# Patient Record
Sex: Male | Born: 1950 | Race: White | Hispanic: No | Marital: Married | Smoking: Current every day smoker
Health system: Southern US, Community
[De-identification: ages and names within clinical notes are randomized; demographics above are authoritative.]

## PROBLEM LIST (undated history)

## (undated) DIAGNOSIS — H269 Unspecified cataract: Secondary | ICD-10-CM

## (undated) DIAGNOSIS — F32A Depression, unspecified: Secondary | ICD-10-CM

## (undated) DIAGNOSIS — G709 Myoneural disorder, unspecified: Secondary | ICD-10-CM

## (undated) DIAGNOSIS — I2510 Atherosclerotic heart disease of native coronary artery without angina pectoris: Secondary | ICD-10-CM

## (undated) DIAGNOSIS — I700 Atherosclerosis of aorta: Secondary | ICD-10-CM

## (undated) DIAGNOSIS — F419 Anxiety disorder, unspecified: Secondary | ICD-10-CM

## (undated) DIAGNOSIS — F329 Major depressive disorder, single episode, unspecified: Secondary | ICD-10-CM

## (undated) DIAGNOSIS — J449 Chronic obstructive pulmonary disease, unspecified: Secondary | ICD-10-CM

## (undated) DIAGNOSIS — M1990 Unspecified osteoarthritis, unspecified site: Secondary | ICD-10-CM

## (undated) HISTORY — DX: Myoneural disorder, unspecified: G70.9

## (undated) HISTORY — PX: BACK SURGERY: SHX140

## (undated) HISTORY — DX: Unspecified osteoarthritis, unspecified site: M19.90

## (undated) HISTORY — DX: Atherosclerotic heart disease of native coronary artery without angina pectoris: I25.10

## (undated) HISTORY — DX: Major depressive disorder, single episode, unspecified: F32.9

## (undated) HISTORY — DX: Atherosclerosis of aorta: I70.0

## (undated) HISTORY — DX: Depression, unspecified: F32.A

## (undated) HISTORY — DX: Unspecified cataract: H26.9

---

## 2000-10-25 ENCOUNTER — Inpatient Hospital Stay (HOSPITAL_COMMUNITY): Admission: EM | Admit: 2000-10-25 | Discharge: 2000-10-26 | Payer: Self-pay | Admitting: *Deleted

## 2003-04-27 ENCOUNTER — Encounter: Admission: RE | Admit: 2003-04-27 | Discharge: 2003-04-27 | Payer: Self-pay | Admitting: *Deleted

## 2003-04-29 ENCOUNTER — Encounter: Admission: RE | Admit: 2003-04-29 | Discharge: 2003-06-06 | Payer: Self-pay | Admitting: *Deleted

## 2003-06-02 ENCOUNTER — Encounter: Admission: RE | Admit: 2003-06-02 | Discharge: 2003-06-02 | Payer: Self-pay | Admitting: Neurosurgery

## 2003-06-07 HISTORY — PX: LUMBAR DISC SURGERY: SHX700

## 2003-06-17 ENCOUNTER — Encounter: Admission: RE | Admit: 2003-06-17 | Discharge: 2003-06-17 | Payer: Self-pay | Admitting: Neurosurgery

## 2003-06-29 ENCOUNTER — Inpatient Hospital Stay (HOSPITAL_COMMUNITY): Admission: RE | Admit: 2003-06-29 | Discharge: 2003-07-01 | Payer: Self-pay | Admitting: Neurosurgery

## 2003-07-07 ENCOUNTER — Encounter: Admission: RE | Admit: 2003-07-07 | Discharge: 2003-07-07 | Payer: Self-pay | Admitting: Neurosurgery

## 2003-07-08 HISTORY — PX: REPAIR DURAL / CSF LEAK: SUR1169

## 2003-07-14 ENCOUNTER — Inpatient Hospital Stay (HOSPITAL_COMMUNITY): Admission: AD | Admit: 2003-07-14 | Discharge: 2003-07-18 | Payer: Self-pay | Admitting: Neurosurgery

## 2003-09-13 ENCOUNTER — Encounter: Admission: RE | Admit: 2003-09-13 | Discharge: 2003-09-13 | Payer: Self-pay | Admitting: Neurosurgery

## 2003-09-19 ENCOUNTER — Encounter: Admission: RE | Admit: 2003-09-19 | Discharge: 2003-09-19 | Payer: Self-pay | Admitting: Infectious Diseases

## 2003-09-20 ENCOUNTER — Inpatient Hospital Stay (HOSPITAL_COMMUNITY): Admission: AD | Admit: 2003-09-20 | Discharge: 2003-09-23 | Payer: Self-pay | Admitting: Neurosurgery

## 2003-10-17 ENCOUNTER — Encounter: Admission: RE | Admit: 2003-10-17 | Discharge: 2003-10-17 | Payer: Self-pay | Admitting: Infectious Diseases

## 2004-10-01 ENCOUNTER — Encounter: Admission: RE | Admit: 2004-10-01 | Discharge: 2004-10-01 | Payer: Self-pay | Admitting: Family Medicine

## 2004-10-18 ENCOUNTER — Ambulatory Visit: Payer: Self-pay | Admitting: Gastroenterology

## 2004-10-22 ENCOUNTER — Ambulatory Visit: Payer: Self-pay | Admitting: Gastroenterology

## 2004-10-23 ENCOUNTER — Ambulatory Visit: Payer: Self-pay | Admitting: Gastroenterology

## 2004-10-25 ENCOUNTER — Ambulatory Visit: Payer: Self-pay | Admitting: Cardiology

## 2004-11-13 ENCOUNTER — Ambulatory Visit: Payer: Self-pay | Admitting: Emergency Medicine

## 2005-01-14 ENCOUNTER — Ambulatory Visit: Payer: Self-pay | Admitting: Emergency Medicine

## 2005-03-27 ENCOUNTER — Ambulatory Visit: Payer: Self-pay | Admitting: Internal Medicine

## 2005-04-09 ENCOUNTER — Ambulatory Visit: Payer: Self-pay | Admitting: Emergency Medicine

## 2005-04-23 ENCOUNTER — Ambulatory Visit: Payer: Self-pay

## 2005-05-01 ENCOUNTER — Ambulatory Visit: Payer: Self-pay | Admitting: Cardiology

## 2005-05-13 ENCOUNTER — Ambulatory Visit: Payer: Self-pay

## 2005-05-13 ENCOUNTER — Encounter: Payer: Self-pay | Admitting: Internal Medicine

## 2005-05-30 ENCOUNTER — Ambulatory Visit: Payer: Self-pay | Admitting: Cardiology

## 2005-06-10 ENCOUNTER — Ambulatory Visit: Payer: Self-pay | Admitting: Cardiology

## 2005-06-13 ENCOUNTER — Ambulatory Visit: Payer: Self-pay | Admitting: Internal Medicine

## 2005-06-13 ENCOUNTER — Inpatient Hospital Stay (HOSPITAL_BASED_OUTPATIENT_CLINIC_OR_DEPARTMENT_OTHER): Admission: RE | Admit: 2005-06-13 | Discharge: 2005-06-13 | Payer: Self-pay | Admitting: Cardiology

## 2006-02-18 ENCOUNTER — Ambulatory Visit: Payer: Self-pay | Admitting: Emergency Medicine

## 2006-11-06 ENCOUNTER — Emergency Department (HOSPITAL_COMMUNITY): Admission: EM | Admit: 2006-11-06 | Discharge: 2006-11-06 | Payer: Self-pay | Admitting: Emergency Medicine

## 2006-11-13 ENCOUNTER — Encounter: Payer: Self-pay | Admitting: Gastroenterology

## 2006-11-13 ENCOUNTER — Ambulatory Visit (HOSPITAL_COMMUNITY): Admission: RE | Admit: 2006-11-13 | Discharge: 2006-11-13 | Payer: Self-pay | Admitting: Gastroenterology

## 2006-11-14 ENCOUNTER — Ambulatory Visit: Payer: Self-pay | Admitting: Gastroenterology

## 2006-11-17 ENCOUNTER — Ambulatory Visit (HOSPITAL_COMMUNITY): Admission: RE | Admit: 2006-11-17 | Discharge: 2006-11-17 | Payer: Self-pay | Admitting: Gastroenterology

## 2006-11-20 ENCOUNTER — Ambulatory Visit: Payer: Self-pay | Admitting: Gastroenterology

## 2006-12-30 ENCOUNTER — Ambulatory Visit: Payer: Self-pay | Admitting: Physical Medicine & Rehabilitation

## 2006-12-30 ENCOUNTER — Encounter
Admission: RE | Admit: 2006-12-30 | Discharge: 2007-03-30 | Payer: Self-pay | Admitting: Physical Medicine & Rehabilitation

## 2007-02-11 ENCOUNTER — Ambulatory Visit: Payer: Self-pay | Admitting: Physical Medicine & Rehabilitation

## 2007-03-30 ENCOUNTER — Encounter: Payer: Self-pay | Admitting: Emergency Medicine

## 2007-03-30 DIAGNOSIS — J449 Chronic obstructive pulmonary disease, unspecified: Secondary | ICD-10-CM | POA: Insufficient documentation

## 2007-03-30 DIAGNOSIS — J45909 Unspecified asthma, uncomplicated: Secondary | ICD-10-CM | POA: Insufficient documentation

## 2007-03-30 DIAGNOSIS — F17200 Nicotine dependence, unspecified, uncomplicated: Secondary | ICD-10-CM | POA: Insufficient documentation

## 2007-03-30 DIAGNOSIS — M129 Arthropathy, unspecified: Secondary | ICD-10-CM | POA: Insufficient documentation

## 2007-03-30 DIAGNOSIS — M5137 Other intervertebral disc degeneration, lumbosacral region: Secondary | ICD-10-CM | POA: Insufficient documentation

## 2007-03-30 DIAGNOSIS — J438 Other emphysema: Secondary | ICD-10-CM | POA: Insufficient documentation

## 2007-03-30 DIAGNOSIS — M1990 Unspecified osteoarthritis, unspecified site: Secondary | ICD-10-CM | POA: Insufficient documentation

## 2008-02-04 ENCOUNTER — Emergency Department (HOSPITAL_COMMUNITY): Admission: EM | Admit: 2008-02-04 | Discharge: 2008-02-04 | Payer: Self-pay | Admitting: Emergency Medicine

## 2008-05-19 ENCOUNTER — Telehealth (INDEPENDENT_AMBULATORY_CARE_PROVIDER_SITE_OTHER): Payer: Self-pay

## 2008-05-23 ENCOUNTER — Encounter: Payer: Self-pay | Admitting: Nurse Practitioner

## 2008-05-23 ENCOUNTER — Telehealth: Payer: Self-pay | Admitting: Nurse Practitioner

## 2008-05-23 ENCOUNTER — Ambulatory Visit: Payer: Self-pay | Admitting: Internal Medicine

## 2008-05-23 DIAGNOSIS — R935 Abnormal findings on diagnostic imaging of other abdominal regions, including retroperitoneum: Secondary | ICD-10-CM | POA: Insufficient documentation

## 2008-05-23 DIAGNOSIS — R634 Abnormal weight loss: Secondary | ICD-10-CM | POA: Insufficient documentation

## 2008-05-23 DIAGNOSIS — R1013 Epigastric pain: Secondary | ICD-10-CM | POA: Insufficient documentation

## 2008-05-23 LAB — CONVERTED CEMR LAB: Albumin: 4.1 g/dL (ref 3.5–5.2)

## 2008-05-25 ENCOUNTER — Encounter (INDEPENDENT_AMBULATORY_CARE_PROVIDER_SITE_OTHER): Payer: Self-pay | Admitting: *Deleted

## 2008-06-06 ENCOUNTER — Telehealth: Payer: Self-pay | Admitting: Nurse Practitioner

## 2008-06-08 ENCOUNTER — Encounter: Payer: Self-pay | Admitting: Gastroenterology

## 2008-06-08 ENCOUNTER — Ambulatory Visit: Payer: Self-pay | Admitting: Vascular Surgery

## 2008-06-09 ENCOUNTER — Telehealth: Payer: Self-pay | Admitting: Nurse Practitioner

## 2008-06-27 ENCOUNTER — Ambulatory Visit: Payer: Self-pay | Admitting: Gastroenterology

## 2008-06-27 DIAGNOSIS — R933 Abnormal findings on diagnostic imaging of other parts of digestive tract: Secondary | ICD-10-CM | POA: Insufficient documentation

## 2008-06-27 DIAGNOSIS — Z860100 Personal history of colon polyps, unspecified: Secondary | ICD-10-CM | POA: Insufficient documentation

## 2008-06-27 DIAGNOSIS — R079 Chest pain, unspecified: Secondary | ICD-10-CM | POA: Insufficient documentation

## 2008-06-28 ENCOUNTER — Encounter: Payer: Self-pay | Admitting: Gastroenterology

## 2008-06-28 ENCOUNTER — Ambulatory Visit: Payer: Self-pay | Admitting: Gastroenterology

## 2008-06-30 ENCOUNTER — Encounter: Payer: Self-pay | Admitting: Gastroenterology

## 2010-04-28 ENCOUNTER — Encounter: Payer: Self-pay | Admitting: Emergency Medicine

## 2010-04-28 ENCOUNTER — Encounter: Payer: Self-pay | Admitting: Neurosurgery

## 2010-07-19 LAB — GLUCOSE, CAPILLARY
Glucose-Capillary: 145 mg/dL — ABNORMAL HIGH (ref 70–99)
Glucose-Capillary: 82 mg/dL (ref 70–99)

## 2010-08-21 NOTE — Assessment & Plan Note (Signed)
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  He has a several year history dating  back at least 2 years of chronic abdominal pain.  The pain is primarily  epigastric in the abdomen with sometimes radiation to the chest.  Pain  sometimes is substernal with radiation to the back.  He is taking  tramadol for pain which gives him some relief.  He has had a 25 pound  weight loss over the last 10 months.  This has been primarily due to  anorexia.  He states that the pain is continuous 24 hours a day.  He  states it is made worse with bending, lifting or stooping.  He denies  any postprandial pain.  He states that the pain begins almost daily at  approximately 4 a.m. and progressively gets worse through the day.  The  pain is relieved somewhat with tramadol at night.  He had trigger point  injections for possible musculoskeletal cause in 2008.  He states that  this did not help.  He denies any history of nausea or vomiting.  He has  some occasional chills.  He has no history of GI bleeding.   ATHEROSCLEROTIC RISK FACTORS:  Primarily include smoking one and a half  packs per day for greater than 30 years.   PAST SURGICAL HISTORY:  He had back surgery in 2005 on L3, L4 and L5.   PAST MEDICAL HISTORY:  He has had a cardiac catheterization and a stress  test in the past to evaluate this pain both of which were negative.  He  was seen by GI, his last full visit was 2 years ago.  At that point Dr.  Russella Dar performed an endoscopy which was negative.  He also had a  colonoscopy 5 years ago which was negative.  He apparently had a biopsy  of his pancreas in 2007 which was negative.  COPD.   MEDICATIONS:  1. Tramadol 50 mg 8 tablets per day.  2. Spiriva inhaler p.r.n.  3. Multivitamin.   ALLERGIES:  He is allergic to ERYTHROMYCIN which causes hallucinations.   FAMILY HISTORY:  Is unremarkable.   SOCIAL HISTORY:  He is married, has two children.  Smoking history as  listed above.  He denies any illicit drug use.  Alcohol, he has had no  alcoholic beverage for 4 or 5 months but has drank heavily at some point  in the past.   REVIEW OF SYSTEMS:  CONSTITUTIONAL:  He has some weight loss and loss of  appetite as mentioned above.  He is 6 feet 1 inch, 129 pounds.  CARDIAC:  He has some shortness of breath with exertion.  PULMONARY:  He has had no recent flareups of his COPD.  GI:  No bleeding, no peptic ulcer disease.  RENAL:  No renal insufficiency.  VASCULAR:  Denies history of TIA or stroke.  NEUROLOGIC:  He has some occasional dizzy spells.  ORTHOPEDIC:  He has multiple joint arthritis and muscle pain.  PSYCHIATRIC:  He has a history of depression which he states is  secondary to this pain.  Of note, on review of his medical  record he has  had one suicide attempt in the remote past.  He was on Celexa at one  point but stopped this 5 or 6 years ago.  He stated that most of his  depression was surrounding a divorce in the past.  ENT:  Negative.  HEMATOLOGIC:  Negative.   PHYSICAL EXAMINATION:  Vital signs:  Blood pressure is 125/82 in the  left arm, pulse is 78 and regular.  HEENT:  Unremarkable.  Neck:  Has 2+  carotid pulses without bruit.  Chest:  Clear to auscultation.  Cardiac:  Regular rate and rhythm without murmur.  Abdomen:  Thin, soft,  nontender, nondistended.  No palpable masses.  Extremities:  He has no  edema.  He has 2+ brachial, radial, femoral, popliteal, dorsalis pedis  and posterior tibial pulses bilaterally.  Neurological:  He has  symmetric 5/5 motor strength in the upper extremity and lower extremity.   I reviewed his CT scan of the abdomen and pelvis dated 02/04/2008.  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  The  superior mesenteric artery, inferior mesenteric artery and renal  arteries were widely patent.  Gallbladder was normal in appearance.   He had a mesenteric arterial duplex scan in our office today.  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  There was  no focal plaque.  The superior mesenteric artery was widely patent.  There was normal flow within the hepatic and splenic artery.  Inferior  mesenteric artery was also widely patent.  The velocities in the celiac  trunk did increase slightly with inhalation compared to exhalation.   In summary, the patient has a long history of chronic abdominal pain.  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  However, I did explain to the patient today that 30% of all people can  have compression of the celiac artery by the arcuate ligament and are  asymptomatic.  He also does not experience postprandial pain.  He also  does not experience significant improvement of the pain with  inspiration.  As a matter of fact, he actually has a higher velocity in  the celiac artery with inhalation compared to exhalation.  I described  all these findings to the patient today.  I also informed him that an  operation for release of median arcuate ligament is not always  beneficial for pain relief symptoms of his type.   I believe the best option is to have a thorough GI evaluation of him  once again.  He states that he has not really had a full evaluation in  over 2 years.  I have referred him back to Dr. Russella Dar for consideration  of this.  If after a full GI workup the patient is still found to have  no other significant cause for his abdominal pain we would consider a  median arcuate ligament release at that time.  The patient will call our  office to schedule an appointment after his GI evaluation is complete.   Janetta Hora. Fields, MD  Electronically  Signed   CEF/MEDQ  D:  06/08/2008  T:  06/09/2008  Job:  [id number]   cc:   Hedwig Morton. Juanda Chance, MD  Samuel Jester, DO  Venita Lick. Russella Dar, MD, Clementeen Graham

## 2010-08-21 NOTE — Assessment & Plan Note (Signed)
Mr. Victorian returns today. He has pain in the mid-back area. He has no pain  in the low back area. No pain over the chest area. He has had no new  medical problems since I last saw him. The Ultram is helping somewhat,  and he is quite pleased about this.   REVIEW OF SYSTEMS:  He has had some constipation and some abdominal  pain.   He is married and lives with his wife.   His blood pressure is 113/57, pulse 74, respiratory rate 18, O2  saturation 99% on room air.  His upper and lower extremity strength and range of motion are normal.  His back has no tenderness to palpation in the low back, but in the mid  back around T7/T8/T9, he does have tenderness to palpation.   IMPRESSION:  1. Thoracic myofascial pain syndrome. Still had some tender areas.      Will do trigger point injections today.  2. Continue tramadol.  3. Will get him started on physical therapy at [facility]      area, and I will see him back in about three weeks to follow up      then.      Erick Colace, M.D.  Electronically Signed     AEK/MedQ  D:  01/15/2007 17:04:01  T:  01/16/2007 10:07:57  Job #:  [id number]

## 2010-08-21 NOTE — Procedures (Signed)
MESENTERIC ARTERIAL DUPLEX EVALUATION   INDICATION:  Celiac artery compression, seen on CT.   HISTORY:  Diabetes:  No.  Cardiac:  No.  Hypertension:  No.  Smoking:  No.   Mesenteric Duplex Findings:  Aorta - Proximal                            65  Aorta - Mid                                 65  Aorta - Distal                              68   Celiac Trunk - Proximal                     370/142 (inhalation), 206/52  (exhalation)  Celiac Trunk - Distal                       190/46   Hepatic Artery                              94  Splenic Artery                              87   Superior Mesenteric Artery-Origin           124  Superior Mesenteric Artery-Proximal         195  Superior Mesenteric Artery-Mid              189  Superior Mesenteric Artery-Distal           134   Inferior Mesenteric Artery-Proximal         153     IMPRESSION:  1. 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  Since no focal      plaque formation was visualized, this increased velocity appears to      correlate with known celiac artery compression syndrome, as noted      from the previous CT.  2. No evidence of increased velocities noted throughout the remainder      of the above-mentioned abdominal vasculature.       ___________________________________________  Janetta Hora Fields, MD   CH/MEDQ  D:  06/08/2008  T:  06/08/2008  Job:  [id number]

## 2010-08-21 NOTE — Procedures (Signed)
NAMEERIAN, ROSENGREN NO.:  [id number]   MEDICAL RECORD NO.:  [id number]          PATIENT TYPE:  REC   LOCATION:  TPC                          FACILITY:  [facility]   PHYSICIAN:  Erick Colace, M.D.DATE OF BIRTH:  [birth year]   DATE OF PROCEDURE:  02/12/2007  DATE OF DISCHARGE:                               OPERATIVE REPORT   Trigger point injection, bilateral T5, T7, T9 paraspinals.  Areas marked  and prepped with Betadine, entered with a 25-gauge 1-1/2-inch needle  directed toward midline approximately 3 cm lateral to spinous process.  Needle inserted to approximately 1-inch depth at angle; 1 mL 1%  lidocaine injected at each site of the erector spinae muscles at these  levels.  The patient tolerated the procedure well.  Pre/post injection  instructions given.  He will follow up with physical therapy and see me  back in 3 weeks.      Erick Colace, M.D.  Electronically Signed     AEK/MEDQ  D:  02/12/2007 15:59:58  T:  02/13/2007 09:39:09  Job:  [id number]

## 2010-08-21 NOTE — Procedures (Signed)
NAMERAYMAR, JOINER NO.:  [id number]   MEDICAL RECORD NO.:  [id number]          PATIENT TYPE:  REC   LOCATION:  TPC                          FACILITY:  [facility]   PHYSICIAN:  Erick Colace, M.D.DATE OF BIRTH:  1950/08/11   DATE OF PROCEDURE:  02/12/2007  DATE OF DISCHARGE:                               OPERATIVE REPORT   Mr. Leedy returns today.  He has a history of mid thoracic pain.  I  performed trigger point injections, T7, T8, T9 area, and he had  essentially complete relief of pain for about two-and-a-half weeks.  Pain did recur, but he thinks it may have been because he started  getting shingles in his lower back, and indeed he did have shingles and  started on Valtrex by [facility].  He has had no new medical problems in  the interval time other than the above, states he does not really have a  primary care physician but goes to [facility].   He did not go to physical therapy because he was doing so well with his  mid back pain that he did not think he needed it.  In addition, he  stopped taking his Tramadol because of improved pain.  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   SOCIAL HISTORY:  Married.   PHYSICAL EXAMINATION:  VITAL SIGNS:  Blood pressure 118/75, pulse 85,  respirations 18, O2 SAT 99% on room air.  GENERAL:  In no acute distress, mood and affect appropriate.  BACK:  He has no tenderness to palpation in the low back area.  He has  some healed shingles areas on the left side around the T12 area.  His  tenderness is at T5, T7, and T9 bilaterally.  He has good forward  flexion and extension of his lumbar and thoracic spine.  He has no  problems with twisting.   IMPRESSION:  Thoracolumbar myofascial pain syndrome.   PLAN:  1. A repeat trigger point injection and this time followed up with      physical therapy for myofascial relief and strengthening  of      extensor muscles.  Should this fail, consider a botulinum toxin      injection under EMG guidance.  2. Samples of a Flector patch were given.  3. He already has a prescription for Tramadol.      Erick Colace, M.D.  Electronically Signed     AEK/MEDQ  D:  02/12/2007 15:58:43  T:  02/12/2007 22:02:19  Job:  [id number]   cc:   Hilda Lias, M.D.  Fax: [phone]

## 2010-08-21 NOTE — Group Therapy Note (Signed)
Consultation requested for the evaluation of mid-back and back pain.   HISTORY:  This is a 60 year old male with what is essentially a several  year history of mid-back pain. He has had low back and had a herniated  disc at L3-4 and L4-5. He underwent left L4-5 hemilaminectomy and  discectomy, and a discectomy at L3-4. Postoperatively, he had L3-4 disc  based discitis and underwent IV antibiotic treatment. He has had  problems in the mid-thoracic area, but also in the abdominal area. He  has undergone extensive workup in terms of his complaints. He states  that his average pain is an 8 out of 10, and it is described as  constant, and it is difficult to say whether it starts in the front and  goes to the back, or vice-versa. The pain does improve with Tramadol,  which he takes 1 at a time, but more recently, he has been taking 2 at a  time twice a day. He is tired of taking medicine and would like to just  find out what is causing it. His sleep is fair because his pain wakes  him up. His relief from medications is fair. He states that it mainly  takes the edge off. He has tried narcotic analgesics mainly post op  discectomy, but he does not like the way these make him feel. 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 He has had some weight loss due to poor appetite and  abdominal pain. He has had an MRI of his abdomen showing a normal  pancreas. This was done because of a CT of the abdomen that showed  an__________ defined pancreatic head mass. This was later felt to be due  to small bowel loop. The CT of his pelvis is negative. The chest CT was  negative as well. It was noted as degenerative disc at L3-4  hemilaminectomy L4. I also called and spoke to the radiologist regarding  the thoracic spine and on the MR splices, this did not go up as high as  the mid-thoracic area.   He had a pancreatic biopsy in 2007 which was negative for carcinoma.  He  has had ED visits for abdominal pain in November 06, 2006. He has had a  cardiac catheterization in 2007 for similar complaints and this was  really minimal and not obstructive coronary disease.   FAMILY HISTORY:  Heart disease, diabetes.   SOCIAL HISTORY:  He is married and lives with his wife. He smokes a pack  and a half a day.   PHYSICAL EXAMINATION:  VITAL SIGNS:  Blood pressure 130/75, pulse 87,  respirations 18, O2 saturation 99% on room air.  ABDOMEN:  Positive bowel sounds. He has no tenderness in the lower  quadrants or upper quadrants, but right at the costal margin left side  greater than right side, he has fairly sensitive areas to palpation. He  can do single leg raises, as well as double leg raise, but these also  aggravate his pain. Similarly, he has pain in the mid-back area.  Tenderness mid-thoracic paraspinal somewhere around T6-T7-T8 area. He  has some pain along the ribs laterally. He has no costovertebral angle  tenderness.   IMPRESSION:  Thoracic pain, as well as some fascial pain of the rectus  abdominis muscle. It does not appear to be disc mediated. His neurologic  exam is negative. He has normal strength, no evidence of spasticity, and  no  sensation changes either in his back or in his lower extremities. His  gait is normal. It does not appear to be myelopathic . He also does not  have any rashes to suggest shingles. He has no pain with twisting of his  thoracic spine to indicate a thoracic herniated nucleus pulposis.   I believe that he likely has some fascial pain syndrome, however, given  that his thoracic spine has not been adequately imaged, I would order  MRI of the thoracic spine to complete the workup. His thoracic CT did  not show any bony lesions that would account for any of his pain  symptoms.   I will send him for MRI and I will see him back. We will increase his  Tramadol to 2 tablets t.i.d. and I have given him some samples and a   prescription for Lidoderm patch to put over the painful areas. He will  also need some  physical therapy, which we will set up at the [facility]  treatment area to do some fascial release and stretching, strengthening  of the abdomen and the thoracic spine.      Erick Colace, M.D.  Electronically Signed     AEK/MedQ  D:  01/01/2007 18:56:16  T:  01/02/2007 11:13:41  Job #:  [id number]

## 2010-08-24 NOTE — H&P (Signed)
NAME:  [name], [name] NO.:  [id number]   MEDICAL RECORD NO.:  [id number]                   PATIENT TYPE:  INP   LOCATION:  3172                                 FACILITY:  [facility]   PHYSICIAN:  Hilda Lias, M.D.                DATE OF BIRTH:  05-29-1950   DATE OF ADMISSION:  06/29/2003  DATE OF DISCHARGE:                                HISTORY & PHYSICAL   Mr. Manetta is a gentleman who had been complaining of back pain which radiates  mostly down to the left leg all the way down to the ankle but not into the  foot itself. This pain had been going on for many months. He had been unable  to work. I saw him on February 14, and after diagnosis, we agreed with  conservative treatment including epidural injection. He is no better. He  feels that he is getting worse. He is afraid that he is going to loose his  job because he has been out of work for more than 12 weeks. X-rays were  obtained, and because of findings, he wanted to proceed with surgery.   PAST MEDICAL HISTORY:  He is allergic to erythromycin.   SOCIAL HISTORY:  He drinks socially. He smokes a pack a day.   FAMILY HISTORY:  Mother died at the age of 36 with muscular dystrophy.  Father in good condition.   REVIEW OF SYSTEMS:  Mostly positive for back and left leg pain.   PHYSICAL EXAMINATION:  GENERAL:  The patient came into my office limping  from the left leg.  HEENT:  Normal.  LUNGS:  Clear.  HEART:  Sounds normal.  ABDOMEN:  Normal.  EXTREMITIES:  Normal pulses.  NEUROLOGICAL:  Mental status normal. Cranial nerves normal. Strength:  I can  break the left heel ___________ . Reflexes symmetrical with decrease of the  left knee jerk.   RADIOLOGIC FINDINGS:  X-rays showed degenerative disk disease at the level  of 3-4. The MRI showed that he has stenosis at the level of 3-4 and 4-5. At  the level of 3-4, he had an intra and extra foraminal herniated disk.   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   RECOMMENDATIONS:  Since the patient has no pain in the right leg, we are  going to proceed with left 3-4 hemilaminectomy and approach the disk  _____________ foraminal. He knows all of the risks with surgery such as  infection, CSF leak, worsening pain, no improvement whatsoever, need for  further surgery which might require fusion.  Hilda Lias, M.D.   EB/MEDQ  D:  06/29/2003  T:  06/29/2003  Job:  [id number]

## 2010-08-24 NOTE — H&P (Signed)
NAME:  [name], [name]                          ACCOUNT NO.:  [id number]   MEDICAL RECORD NO.:  [id number]                   PATIENT TYPE:  OIB   LOCATION:  3025                                 FACILITY:  [facility]   PHYSICIAN:  Hilda Lias, M.D.                DATE OF BIRTH:  Aug 08, 1950   DATE OF ADMISSION:  07/13/2003  DATE OF DISCHARGE:                                HISTORY & PHYSICAL   HISTORY OF PRESENT ILLNESS:  Mr. Minion is a gentleman who underwent L3-L4  hemilaminectomy with an extra-foraminal diskectomy of L3-L4.  This problem  has been going for several months, and he failed with conservative  treatment.  The patient did well.  He went home 48 hours later.  Later one,  about 2-3 days later, he complained of headache.  Dr. Channing Mutters ordered a CT scan  of the lumbar area which was negative for any CSF leak.  I saw him in my  office two days ago, and he was having some mild headache.  There was a  small amount of fluid collection in the __________ space.  We continued with  observation but last night he called me, telling me that the headache was  getting worse.  He has nausea and vomiting.  Because of that, he is being  admitted for exploration of the lumbar wound.   PAST MEDICAL HISTORY:  Lumbar laminectomy with diskectomy, less than two  weeks ago.   SOCIAL HISTORY:  He smokes a pack a day.  He drinks socially.   FAMILY HISTORY:  His mother at 32 years old with muscular dystrophy.   REVIEW OF SYSTEMS:  Positive for headache.   PHYSICAL EXAMINATION:  HEENT:  Normal.  NECK:  Normal.  LUNGS:  Clear.  HEART:  Sounds normal.  ABDOMEN:  Normal.  EXTREMITIES:  He still has __________ from the previous radiculopathy.  NEUROLOGIC:  Mental status normal.  Strength is normal.  In the lumbar area,  he has a wound which is well healed but there is a small amount of fluid  collection in the area.  The reflexes are symmetrical.   IMPRESSION:  Rule out cerebrospinal fluid leak.   RECOMMENDATIONS:  Because the patient is getting worse, despite the negative  CT scan, we are going to explore the wound to be sure there is not any  cerebrospinal fluid leak.                                                Hilda Lias, M.D.    EB/MEDQ  D:  07/13/2003  T:  07/14/2003  Job:  [id number]
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
## 2010-08-24 NOTE — H&P (Signed)
NAME:  [name], [name] NO.:  [id number]   MEDICAL RECORD NO.:  [id number]                   PATIENT TYPE:  INP   LOCATION:  3041                                 FACILITY:  [facility]   PHYSICIAN:  Cristi Loron, M.D.            DATE OF BIRTH:  1950-10-20   DATE OF ADMISSION:  09/20/2003  DATE OF DISCHARGE:                                HISTORY & PHYSICAL   CHIEF COMPLAINT:  Pain.   HISTORY OF PRESENT ILLNESS:  The patient is a 60 year old white male patient  of Dr. Cassandria Santee.  Dr. Jeral Fruit had performed a left LV ___________on [name]  on June 29, 2003.  This was complicated by a spinal fluid leak requiring a  revision of his wound on July 13, 2003.  The patient was discharged to home  on July 18, 2003 and he subsequently developed a lot of pain.  He was  worked up with a lumbar MRI which demonstrated __________with L3-4 diskitis.  Dr. Jeral Fruit sent the patient for a needle aspiration of the disk space on  September 13, 2003 via fluoroscopy.  The cultures came back negative.  The patient  was then referred to Dr. Burnice Logan for ID opinion.  Dr. Roxan Hockey saw the  patient yesterday and recommended he be admitted for another needle-guided  aspiration and cultures and starting a PICC line and empiric antibiotics.   Presently the patient complains of back pain.   PAST MEDICAL HISTORY:  Positive for back surgery as above.   ALLERGIES:  ERYTHROMYCIN.   SOCIAL HISTORY:  The patient smokes a pack a day.  He drinks socially.   FAMILY HISTORY:  The patient's mother died at age 56 with muscular  dystrophy.  The patient's father is age 68 and in good condition.   PHYSICAL EXAMINATION:  GENERAL:  A thin, 60 year old white male complaining  of back pain.  ABDOMEN:  Soft.  BACK:  His back incision is well-healed, no signs of infection.  NEUROLOGIC:  Muscle strength is grossly normal.   ASSESSMENT/PLAN:  L3-4 diskitis.  I have discussed this situation with the  patient.  I recommended that he be admitted to the hospital for blood  cultures, another fluoroscopy-guided needle biopsy and culture of his L3-4  disk space, placement of PICC line, and starting on empiric antibiotics.  Furthermore will manage him on a morphine PCA pump.  The patient is  agreeable with this plan.                                                Cristi Loron, M.D.    JDJ/MEDQ  D:  09/20/2003  T:  09/20/2003  Job:  [id number]

## 2010-08-24 NOTE — Cardiovascular Report (Signed)
NAME:  [name], [name] NO.:  [id number]   MEDICAL RECORD NO.:  [id number]          PATIENT TYPE:  OIB   LOCATION:  1962                         FACILITY:  [facility]   PHYSICIAN:  Arvilla Meres, M.D. LHCDATE OF BIRTH:  07/07/1950   DATE OF PROCEDURE:  06/13/2005  DATE OF DISCHARGE:                              CARDIAC CATHETERIZATION   PATIENT IDENTIFICATION:  [name] is a 60 year old male with a long history  of ongoing tobacco use, who was evaluated in the clinic by Dr. Diona Browner for  exertional dyspnea as well as some atypical chest pain and palpitations.  Underwent a Cardiolite which showed some mild inferior apical ischemia.  Discussions were had regarding our workup, including continued medical  therapy versus cardiac catheterization.  [name] was interested in pursuing  cardiac catheterization, so this was scheduled in the outpatient  catheterization laboratory.   PROCEDURES PERFORMED:  1.  Selective coronary angiography.  2.  Left heart catheterization.  3.  Left ventriculogram.  4.  Abdominal aortogram.   DESCRIPTION OF PROCEDURE:  A 4-French arterial sheath was placed in right  femoral artery using a modified Seldinger technique.  Standard preformed  Judkins catheters including a JL-4, JR-4 and angled pigtail were used for  the procedure, all catheter changes made over a wire.  The patient was  transferred to the holding area after the procedure in stable condition.  There were no apparent complications.   FINDINGS:  Central aortic pressure is 108/67 with a mean of 87.  LV was  103/1 with an LVEDP of 6.   Left main was normal.   LAD was a long vessel wrapping the apex.  There was a moderate amount of  calcification in the proximal to midsection.  There were three diagonals.  The first one was a moderate-sized, the second and third were small. There  was a 20% tubular lesion in the midsection, which was calcified.   Left circumflex was a small  vessel.  It was made up to a small OM-1 and a  small OM-2.  There was no angiographic CAD.   Right coronary was a large, dominant vessel.  It gave off an RV branch, a  large PDA and two moderate-sized PLs.  There was no angiographic CAD.   Left ventriculogram shot in the RAO position showed an EF of 70%.  There  were no wall motion abnormalities or mitral regurgitation.   Abdominal aortogram showed minimal 20-25% ostial right renal artery  stenosis.  There was no abdominal aneurysm, no significant abdominal  aortoiliac plaquing.   ASSESSMENT:  1.  Minimal nonobstructive coronary disease.  2.  Normal left ventricular function.  3.  Minimal right renal artery stenosis as above.   The plan will be to continue with medical therapy.      Arvilla Meres, M.D. [facility]  Electronically Signed     DB/MEDQ  D:  06/13/2005  T:  06/13/2005  Job:  [id number]
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
## 2010-08-24 NOTE — Assessment & Plan Note (Signed)
[facility]                               PULMONARY OFFICE NOTE   NAME:[name], [name]                       MRN:          [id number]  DATE:02/18/2006                            DOB:          01/08/1951    SUBJECTIVE:  Mr. Matton is a 60 year old gentleman who presents today for a  regularly scheduled followup of his tobacco use and COPD.  He tells me that  he continues to have discomfort with activity, deep breathing and exertion  at his costal margins.  It is tender to palpation and seems to be  exacerbated by his job, which involves heavy lifting and frequent bending.  He has had an extensive evaluation to rule out both cardiac and GI sources  for his discomfort.  His cardiac catheterization has been performed, and  this was reassuring.  He continues to smoke, but he has decreased to 1 pack  per day, down from 2 packs per day.  He tells me also that since our last  visit he has been through a separation from his significant other, and this  has affected him emotionally and also with regard to his function.  He has  lost 8 pounds since our last visit, and he ascribes that weight loss to this  change in his social situation.   CURRENT MEDICATIONS:  1. Ultram 50 mg t.i.d.  2. Multivitamin daily.  3. Spiriva 1 inhalation daily.  4. Mobic 15 mg daily.   PHYSICAL EXAMINATION:  GENERAL:  This is a thin, comfortable gentleman who  is in no distress on room air.  His weight is 134 pounds, temperature 98.4, blood pressure 116/78, heart  rate 81, SpO2 96% on room air.  He is thin.  His oropharynx is clear.  NECK:  Without JVD or stridor.  LUNGS:  Somewhat distant but with decent air movement, and no wheezing or  crackles.  HEART:  Has a regular rate and rhythm without murmur.  ABDOMEN:  Soft and benign.  CHEST:  His chest wall is significant for tenderness to palpation of the  costal cartilages, especially laterally.  This reproduces the pain that has  been bothering him since our initial visit.  NEUROLOGIC:  Grossly nonfocal exam.   IMPRESSION:  1. Mild chronic obstructive pulmonary disease.  2. Continued tobacco use.  3. Costochondritis that is probably being exacerbated by the physical      nature of his job.   PLANS:  1. A short burst of prednisone 40 mg for 5 days, and he will continue his      nonsteroidal anti-inflammatory drug for his costochondritis.  2. Continue Spiriva and albuterol p.r.n.  3. Smoking cessation was discussed.  4. Follow up in 6 months, or on an as-needed basis.     Leslye Peer, MD  Electronically Signed    RSB/MedQ  DD: 02/18/2006  DT: 02/19/2006  Job #: [id number]   cc:   Marye Round, MD  Jonelle Sidle, MD

## 2010-08-24 NOTE — Op Note (Signed)
NAME:  NEZAR, BUCKLES                          ACCOUNT NO.:  [id number]   MEDICAL RECORD NO.:  [id number]                   PATIENT TYPE:  OIB   LOCATION:  3025                                 FACILITY:  [facility]   PHYSICIAN:  Hilda Lias, M.D.                DATE OF BIRTH:  [birth year]   DATE OF PROCEDURE:  07/13/2003  DATE OF DISCHARGE:                                 OPERATIVE REPORT   PREOPERATIVE DIAGNOSIS:  Rule out cerebrospinal fluid leak.   POSTOPERATIVE DIAGNOSIS:  Cerebrospinal fluid leak.   PROCEDURE:  1. __________ lumbar 1 .  2. Repair of right lateral open dura matter, microscopically.   SURGEON:  Hilda Lias, M.D.   OPERATIVE PROCEDURE:  The patient was taken to the OR and incision was done  in the midline from the previous one.  Immediately we found fluid in the  subcutaneous space.  With dissection, we went straight down to the area  where he had the surgery before.  The extraforaminal area was completely  sealed.  Indeed, there was some CSF coming from the foraminal portion of the  spine.  We looked above and below, as well as following the nerve root, and  we did not find any at all.  Then, we were able to see there was something  coming out within the midline.  Indeed, it was difficult to see the opening  with the microscope.  We had to do part of lamina, as well as the spinous  process and turn the patient toward the right side.  Finally, we were able  to see a small opening in the midline, mostly going to the right side away  from the area where he had the surgery.  Prolene suture with a #6-0 was  done.  At the end, there was no more evidence of CSF leak.  Valsalva  maneuver was negative.  From then on, Bio-Glue was left in the pleural space  and wound was closed with Vicryl and nylon.                                               Hilda Lias, M.D.    EB/MEDQ  D:  07/13/2003  T:  07/14/2003  Job:  [id number]

## 2010-08-24 NOTE — Discharge Summary (Signed)
NAME:  [name], [name] NO.:  [id number]   MEDICAL RECORD NO.:  [id number]                   PATIENT TYPE:  INP   LOCATION:  3041                                 FACILITY:  [facility]   PHYSICIAN:  Cristi Loron, M.D.            DATE OF BIRTH:  March 19, 1951   DATE OF ADMISSION:  09/20/2003  DATE OF DISCHARGE:  09/23/2003                                 DISCHARGE SUMMARY   BRIEF HISTORY:  The patient is a 60 year old white male patient of Dr.  Cassandria Santee who Dr. Jeral Fruit performed back surgery on June 29, 2003. This was  complicated by spinal fluid leak requiring revision of his wound on July 13, 2003. The patient was discharged to home but subsequently developed a lot of  pain. He was diagnosed with diskitis. The patient was sent for a needle  aspiration of the disk space on September 13, 2003 via fluoroscopy. The cultures  came back negative. The patient was referred to Dr. Burnice Logan for ID  opinion. Dr. Roxan Hockey had seen the patient and recommended he be admitted,  had a second biopsy, and empiric antibiotics started. He is admitted as  above.   For further details of this admission, please refer to typed history and  physical.   HOSPITAL COURSE:  The patient was admitted on September 20, 2003 with a diagnosis  of a diskitis. He was treated with empiric antibiotics. He got a PICC line  and arrangements were made for home IV antibiotics. He was requesting  discharge home on September 23, 2003 and was discharged home then.   DISCHARGE MEDICATIONS:  Vancomycin and ceftriaxone IV for 6 weeks. The  patient was to follow up with Dr. Burnice Logan in the ID clinic, follow up  with Dr. Jeral Fruit.   FINAL DIAGNOSIS:  Diskitis.   PROCEDURE PERFORMED:  Needle aspiration.                                                Cristi Loron, M.D.    JDJ/MEDQ  D:  12/01/2003  T:  12/02/2003  Job:  [id number]
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
## 2010-08-24 NOTE — Discharge Summary (Signed)
NAME:  [name], [name]                          ACCOUNT NO.:  [id number]   MEDICAL RECORD NO.:  [id number]                   PATIENT TYPE:  OIB   LOCATION:  3025                                 FACILITY:  [facility]   PHYSICIAN:  Hilda Lias, M.D.                DATE OF BIRTH:  October 22, 1950   DATE OF ADMISSION:  07/13/2003  DATE OF DISCHARGE:  07/18/2003                                 DISCHARGE SUMMARY   ADMISSION DIAGNOSIS:  Rule out cerebrospinal fluid leak.   DISCHARGE DIAGNOSIS:  Cerebrospinal fluid leak.   HISTORY:  The patient was admitted because of headache.  Prior to that,  about two weeks ago, he underwent a diskectomy and laminectomy at the level  of L2-3 and L3-4.  He had been complaining of headache.  He had a CT scan  done by Dr. Channing Mutters as an outpatient which was negative for CSF leak.  Nevertheless, clinically he was complaining of persistent headache.  Because  of that, he was admitted to the hospital.   LABORATORY DATA:  Normal.   HOSPITAL COURSE:  The patient was taken to surgery and repair of right  lateral hole in the dura mater was made.  After that, the patient was  supposed to be in bed, but when I saw him he was __________ 24 hours post  surgery.  The patient developed a little bit of fluid __________ , but right  now he has no headache whatsoever, and he feels that he wants to go home.  He is to go home, and he knows that he is not to do any heavy lifting.  He  is not to do any type of physical work, and essentially just to be sitting  or laying down in bed.  I will be seeing him in 10 days or before.   CONDITION ON DISCHARGE:  Improved.   DISCHARGE MEDICATIONS:  Percocet.   DIET:  Regular.   ACTIVITY:  As above.  No lifting.   FOLLOWUP:  He will be seen by me in 10 days.                                                Hilda Lias, M.D.    EB/MEDQ  D:  07/18/2003  T:  07/19/2003  Job:  [id number]

## 2010-08-24 NOTE — Op Note (Signed)
NAME:  KENN, REKOWSKI                          ACCOUNT NO.:  [id number]   MEDICAL RECORD NO.:  [id number]                   PATIENT TYPE:  INP   LOCATION:  3014                                 FACILITY:  [facility]   PHYSICIAN:  Hilda Lias, M.D.                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   OPERATION PERFORMED:  Left L4 on L3 hemilaminectomy, foraminotomy for 3-4, 4-  5 with left L3-L4 extraforaminal diskectomy.  Microscope.   SURGEON:  Hilda Lias, M.D.   ASSISTANT:  ___________.   ANESTHESIA:  General.   DESCRIPTION OF PROCEDURE:  The patient was taken to the operating room and  positioned in a prone manner.  The back was prepped with Betadine.  Midline  incision from L3 down to L4 was made.  Muscle was retracted laterally.  We  identified the 4-5 and 3-4 space by x-ray and with the drill, we drilled the  upper lamina of L5.  The total hemilamina of L4 and L3.  A thick yellow  ligament was excised.  We went to the foramen.  A foraminotomy was  accomplished to decompress the L5, L4 nerve root.  Then we went laterally  and we found the transverse processes of 3 and 4.  With the microscope, we  removed the intertransverse ligament. We drilled the superior medial aspect  of the facet.  We found the L3 nerve root which was swollen.  Indeed there  was a piece of calcified disk right at the take off of the L3 nerve root.  Removal was done. Then we retracted the L3 nerve root.  We entered the disk  space and total diskectomy with removal of a large amount of degenerative  disk was done.  The patient had quite a bit of adhesions distal at L2 which  were also taken care of.  At the end we  had a decompression.  Then fentanyl  was left in the dural space and the wound was closed with Vicryl and Steri-  Strips.                                               Hilda Lias, M.D.    EB/MEDQ  D:  06/29/2003  T:  06/30/2003  Job:  [id number]

## 2010-11-01 ENCOUNTER — Other Ambulatory Visit: Payer: Self-pay | Admitting: Family Medicine

## 2010-11-01 DIAGNOSIS — R911 Solitary pulmonary nodule: Secondary | ICD-10-CM

## 2010-11-01 DIAGNOSIS — R634 Abnormal weight loss: Secondary | ICD-10-CM

## 2010-11-22 ENCOUNTER — Inpatient Hospital Stay
Admission: RE | Admit: 2010-11-22 | Discharge: 2010-11-22 | Payer: Self-pay | Source: Ambulatory Visit | Attending: Family Medicine | Admitting: Family Medicine

## 2011-01-07 LAB — DIFFERENTIAL
Eosinophils Absolute: 0.1
Eosinophils Relative: 2
Lymphocytes Relative: 21
Lymphs Abs: 1.7
Monocytes Absolute: 0.7

## 2011-01-07 LAB — COMPREHENSIVE METABOLIC PANEL
ALT: 21
AST: 25
Albumin: 3.8
CO2: 30
Chloride: 105
Creatinine, Ser: 0.8
GFR calc Af Amer: >60
GFR calc non Af Amer: >60
Sodium: 142
Total Bilirubin: 1

## 2011-01-07 LAB — POCT CARDIAC MARKERS: Myoglobin, poc: 55.7

## 2011-01-07 LAB — CBC
MCV: 95.8
Platelets: 220
RBC: 4.2 — ABNORMAL LOW
WBC: 8.5

## 2011-01-07 LAB — CK TOTAL AND CKMB (NOT AT ARMC)
CK, MB: 2.4
Relative Index: 1.7

## 2011-01-07 LAB — TROPONIN I: Troponin I: 0.03

## 2011-01-07 LAB — URINALYSIS, ROUTINE W REFLEX MICROSCOPIC
Protein, ur: NEGATIVE
Urobilinogen, UA: 1

## 2011-01-21 LAB — CBC
HCT: 37.8 — ABNORMAL LOW
Hemoglobin: 13.2
MCV: 93.1
Platelets: 242
RDW: 12.2
WBC: 6.2

## 2011-01-21 LAB — DIFFERENTIAL
Basophils Absolute: 0
Basophils Relative: 0
Eosinophils Absolute: 0.1
Eosinophils Relative: 1
Lymphocytes Relative: 31
Lymphs Abs: 1.9
Monocytes Absolute: 0.5
Monocytes Relative: 8
Neutro Abs: 3.7
Neutrophils Relative %: 59

## 2011-01-21 LAB — COMPREHENSIVE METABOLIC PANEL
ALT: 18
AST: 23
Albumin: 3.8
Alkaline Phosphatase: 79
BUN: 13
CO2: 28
Calcium: 9.3
Chloride: 101
Creatinine, Ser: 0.73
GFR calc Af Amer: >60
GFR calc non Af Amer: >60
Glucose, Bld: 108 — ABNORMAL HIGH
Potassium: 3.6
Sodium: 138
Total Bilirubin: 0.4
Total Protein: 6.9

## 2011-01-21 LAB — URINALYSIS, ROUTINE W REFLEX MICROSCOPIC
Glucose, UA: NEGATIVE
Hgb urine dipstick: NEGATIVE
Ketones, ur: NEGATIVE
Protein, ur: NEGATIVE

## 2011-01-21 LAB — LIPASE, BLOOD: Lipase: 24

## 2013-05-04 ENCOUNTER — Encounter: Payer: Self-pay | Admitting: Gastroenterology

## 2013-05-23 ENCOUNTER — Emergency Department (HOSPITAL_COMMUNITY): Payer: 59

## 2013-05-23 ENCOUNTER — Emergency Department (HOSPITAL_COMMUNITY)
Admission: EM | Admit: 2013-05-23 | Discharge: 2013-05-23 | Disposition: A | Discharge status: Home / Self Care | Payer: 59 | Attending: Emergency Medicine | Admitting: Emergency Medicine

## 2013-05-23 ENCOUNTER — Encounter (HOSPITAL_COMMUNITY): Payer: Self-pay | Admitting: Emergency Medicine

## 2013-05-23 DIAGNOSIS — Z9889 Other specified postprocedural states: Secondary | ICD-10-CM | POA: Insufficient documentation

## 2013-05-23 DIAGNOSIS — Z79899 Other long term (current) drug therapy: Secondary | ICD-10-CM | POA: Insufficient documentation

## 2013-05-23 DIAGNOSIS — J441 Chronic obstructive pulmonary disease with (acute) exacerbation: Secondary | ICD-10-CM | POA: Insufficient documentation

## 2013-05-23 DIAGNOSIS — J449 Chronic obstructive pulmonary disease, unspecified: Secondary | ICD-10-CM

## 2013-05-23 DIAGNOSIS — F17200 Nicotine dependence, unspecified, uncomplicated: Secondary | ICD-10-CM

## 2013-05-23 DIAGNOSIS — H53149 Visual discomfort, unspecified: Secondary | ICD-10-CM | POA: Insufficient documentation

## 2013-05-23 DIAGNOSIS — R634 Abnormal weight loss: Secondary | ICD-10-CM | POA: Insufficient documentation

## 2013-05-23 DIAGNOSIS — G44209 Tension-type headache, unspecified, not intractable: Secondary | ICD-10-CM | POA: Insufficient documentation

## 2013-05-23 DIAGNOSIS — R0602 Shortness of breath: Secondary | ICD-10-CM

## 2013-05-23 DIAGNOSIS — R209 Unspecified disturbances of skin sensation: Secondary | ICD-10-CM | POA: Insufficient documentation

## 2013-05-23 DIAGNOSIS — Z791 Long term (current) use of non-steroidal anti-inflammatories (NSAID): Secondary | ICD-10-CM

## 2013-05-23 HISTORY — DX: Chronic obstructive pulmonary disease, unspecified: J44.9

## 2013-05-23 LAB — CBC
HEMATOCRIT: 43.1 % (ref 39.0–52.0)
Hemoglobin: 14.4 g/dL (ref 13.0–17.0)
MCH: 32 pg (ref 26.0–34.0)
MCHC: 33.4 g/dL (ref 30.0–36.0)
MCV: 95.8 fL (ref 78.0–100.0)
Platelets: 214 10*3/uL (ref 150–400)
RBC: 4.5 MIL/uL (ref 4.22–5.81)
RDW: 13.4 % (ref 11.5–15.5)
WBC: 7 10*3/uL (ref 4.0–10.5)

## 2013-05-23 LAB — COMPREHENSIVE METABOLIC PANEL
ALBUMIN: 4.2 g/dL (ref 3.5–5.2)
ALT: 11 U/L (ref 0–53)
AST: 20 U/L (ref 0–37)
Alkaline Phosphatase: 69 U/L (ref 39–117)
BILIRUBIN TOTAL: 0.3 mg/dL (ref 0.3–1.2)
BUN: 17 mg/dL (ref 6–23)
CALCIUM: 10 mg/dL (ref 8.4–10.5)
CO2: 27 mEq/L (ref 19–32)
CREATININE: 0.68 mg/dL (ref 0.50–1.35)
Chloride: 100 mEq/L (ref 96–112)
GFR calc Af Amer: >90 mL/min (ref >90)
GFR calc non Af Amer: >90 mL/min (ref >90)
Glucose, Bld: 90 mg/dL (ref 70–99)
Potassium: 4 mEq/L (ref 3.7–5.3)
Sodium: 140 mEq/L (ref 137–147)
TOTAL PROTEIN: 7.4 g/dL (ref 6.0–8.3)

## 2013-05-23 MED ORDER — KETOROLAC TROMETHAMINE 30 MG/ML IJ SOLN
30.0000 mg | Freq: Once | INTRAMUSCULAR | Status: AC
  Administered 2013-05-23: 30 mg via INTRAVENOUS
  Filled 2013-05-23: qty 1

## 2013-05-23 MED ORDER — DEXAMETHASONE SODIUM PHOSPHATE 10 MG/ML IJ SOLN
10.0000 mg | Freq: Once | INTRAMUSCULAR | Status: AC
  Administered 2013-05-23: 10 mg via INTRAVENOUS
  Filled 2013-05-23: qty 1

## 2013-05-23 MED ORDER — DIPHENHYDRAMINE HCL 50 MG/ML IJ SOLN
12.5000 mg | Freq: Once | INTRAMUSCULAR | Status: AC
  Administered 2013-05-23: 12.5 mg via INTRAVENOUS
  Filled 2013-05-23: qty 1

## 2013-05-23 MED ORDER — PROCHLORPERAZINE EDISYLATE 5 MG/ML IJ SOLN
10.0000 mg | Freq: Once | INTRAMUSCULAR | Status: AC
  Administered 2013-05-23: 10 mg via INTRAVENOUS
  Filled 2013-05-23: qty 2

## 2013-05-23 MED ORDER — SODIUM CHLORIDE 0.9 % IV BOLUS (SEPSIS)
1000.0000 mL | Freq: Once | INTRAVENOUS | Status: AC
  Administered 2013-05-23: 1000 mL via INTRAVENOUS

## 2013-05-23 MED ORDER — DIAZEPAM 5 MG PO TABS: 5.0000 mg | ORAL_TABLET | Freq: Three times a day (TID) | ORAL | Status: DC | PRN

## 2013-05-23 NOTE — Discharge Instructions (Signed)
1. Medications: valium as needed for headache, usual home medications 2. Treatment: rest, drink plenty of fluids,  3. Follow Up: Please followup with your primary doctor for discussion of your diagnoses and further evaluation after today's visit; Please also follow-up with neurology in regards to your headaches and pulmonology for further evaluation of your shortness of breath  Tension Headache A tension headache is a feeling of pain, pressure, or aching often felt over the front and sides of the head. The pain can be dull or can feel tight (constricting). It is the most common type of headache. Tension headaches are not normally associated with nausea or vomiting and do not get worse with physical activity. Tension headaches can last 30 minutes to several days.  CAUSES  The exact cause is not known, but it may be caused by chemicals and hormones in the brain that lead to pain. Tension headaches often begin after stress, anxiety, or depression. Other triggers may include:  Alcohol.  Caffeine (too much or withdrawal).  Respiratory infections (colds, flu, sinus infections).  Dental problems or teeth clenching.  Fatigue.  Holding your head and neck in one position too long while using a computer. SYMPTOMS   Pressure around the head.   Dull, aching head pain.   Pain felt over the front and sides of the head.   Tenderness in the muscles of the head, neck, and shoulders. DIAGNOSIS  A tension headache is often diagnosed based on:   Symptoms.   Physical examination.   A CT scan or MRI of your head. These tests may be ordered if symptoms are severe or unusual. TREATMENT  Medicines may be given to help relieve symptoms.  HOME CARE INSTRUCTIONS   Only take over-the-counter or prescription medicines for pain or discomfort as directed by your caregiver.   Lie down in a dark, quiet room when you have a headache.   Keep a journal to find out what may be triggering your headaches.  For example, write down:  What you eat and drink.  How much sleep you get.  Any change to your diet or medicines.  Try massage or other relaxation techniques.   Ice packs or heat applied to the head and neck can be used. Use these 3 to 4 times per day for 15 to 20 minutes each time, or as needed.   Limit stress.   Sit up straight, and do not tense your muscles.   Quit smoking if you smoke.  Limit alcohol use.  Decrease the amount of caffeine you drink, or stop drinking caffeine.  Eat and exercise regularly.  Get 7 to 9 hours of sleep, or as recommended by your caregiver.  Avoid excessive use of pain medicine as recurrent headaches can occur.  SEEK MEDICAL CARE IF:   You have problems with the medicines you were prescribed.  Your medicines do not work.  You have a change from the usual headache.  You have nausea or vomiting. SEEK IMMEDIATE MEDICAL CARE IF:   Your headache becomes severe.  You have a fever.  You have a stiff neck.  You have loss of vision.  You have muscular weakness or loss of muscle control.  You lose your balance or have trouble walking.  You feel faint or pass out.  You have severe symptoms that are different from your first symptoms. MAKE SURE YOU:   Understand these instructions.  Will watch your condition.  Will get help right away if you are not doing well or  get worse. Document Released: 03/25/2005 Document Revised: 06/17/2011 Document Reviewed: 03/15/2011 [facility] Patient Information 2014 [place], [place].   Shortness of Breath Shortness of breath means you have trouble breathing. Shortness of breath may indicate that you have a medical problem. You should seek immediate medical care for shortness of breath. CAUSES   Not enough oxygen in the air (as with high altitudes or a smoke-filled room).  Short-term (acute) lung disease, including:  Infections, such as pneumonia.  Fluid in the lungs, such as heart  failure.  A blood clot in the lungs (pulmonary embolism).  Long-term (chronic) lung diseases.  Heart disease (heart attack, angina, heart failure, and others).  Low red blood cells (anemia).  Poor physical fitness. This can cause shortness of breath when you exercise.  Chest or back injuries or stiffness.  Being overweight.  Smoking.  Anxiety. This can make you feel like you are not getting enough air. DIAGNOSIS  Serious medical problems can usually be found during your physical exam. Tests may also be done to determine why you are having shortness of breath. Tests may include:  Chest X-rays.  Lung function tests.  Blood tests.  Electrocardiography.  Exercise testing.  Echocardiography.  Imaging scans. Your caregiver may not be able to find a cause for your shortness of breath after your exam. In this case, it is important to have a follow-up exam with your caregiver as directed.  TREATMENT  Treatment for shortness of breath depends on the cause of your symptoms and can vary greatly. HOME CARE INSTRUCTIONS   Do not smoke. Smoking is a common cause of shortness of breath. If you smoke, ask for help to quit.  Avoid being around chemicals or things that may bother your breathing, such as paint fumes and dust.  Rest as needed. Slowly resume your usual activities.  If medicines were prescribed, take them as directed for the full length of time directed. This includes oxygen and any inhaled medicines.  Keep all follow-up appointments as directed by your caregiver. SEEK MEDICAL CARE IF:   Your condition does not improve in the time expected.  You have a hard time doing your normal activities even with rest.  You have any side effects or problems with the medicines prescribed.  You develop any new symptoms. SEEK IMMEDIATE MEDICAL CARE IF:   Your shortness of breath gets worse.  You feel lightheaded, faint, or develop a cough not controlled with medicines.  You  start coughing up blood.  You have pain with breathing.  You have chest pain or pain in your arms, shoulders, or abdomen.  You have a fever.  You are unable to walk up stairs or exercise the way you normally do. MAKE SURE YOU:  Understand these instructions.  Will watch your condition.  Will get help right away if you are not doing well or get worse. Document Released: 12/18/2000 Document Revised: 09/24/2011 Document Reviewed: 06/10/2011 [facility] Patient Information 2014 [place].

## 2013-05-23 NOTE — ED Provider Notes (Signed)
CSN: [id number]     Arrival date & time 05/23/13  1352 History   First MD Initiated Contact with Patient 05/23/13 1514     Chief Complaint  Patient presents with  . Headache  . Shortness of Breath     (Consider location/radiation/quality/duration/timing/severity/associated sxs/prior Treatment) Patient is a 63 y.o. male presenting with headaches and shortness of breath. The history is provided by the patient and medical records. No language interpreter was used.  Headache Pain location:  Generalized Quality:  Unable to specify Radiates to:  Does not radiate Severity currently:  7/10 Severity at highest:  10/10 Onset quality:  Gradual Duration:  8 weeks Timing:  Intermittent Progression:  Waxing and waning Chronicity:  Recurrent Similar to prior headaches: yes   Context: bright light and emotional stress   Relieved by:  Nothing Worsened by:  Light and sound Ineffective treatments:  NSAIDs, prescription medications and resting in a darkened room Associated symptoms: photophobia   Associated symptoms: no abdominal pain, no back pain, no cough, no diarrhea, no fatigue, no fever, no nausea, no neck stiffness and no vomiting   Risk factors: no family hx of SAH   Shortness of Breath Severity:  Mild Onset quality:  Gradual Duration:  8 weeks Timing:  Constant Progression:  Unchanged Chronicity:  Chronic Relieved by:  Nothing Worsened by:  Nothing tried Ineffective treatments:  Inhaler Associated symptoms: headaches   Associated symptoms: no abdominal pain, no chest pain, no cough, no diaphoresis, no fever, no rash, no vomiting and no wheezing     RODERT [name] is a 63 y.o. male  with a hx of COPD, back surgery presents to the Emergency Department complaining of gradual, intermittent headache onset 2 months ago.  Pt reports the headaches are recurrent daily, beginning at the base of his skull and traveling upward until it reaches his forehead.  They do not occur at any particular  time during the day and no trigger is noted. They are associated with photophobia and phonophobia.  Pt takes 8-10 Goody Powders per day for the pain without relief.  Yesterday he took 5 vicodin without relief.  He reports the headache is generalized and throbbing, but nothing truly makes it worse.  He endorses paresthesias of the hands and feet which been present for several years and are not associated with his headaches. He also reports at times feeling lightheaded and having nausea when he goes from sitting to standing. In the last several months he's had episodes where he says stumbled after getting a but denies other gait disturbance.  He reports he believes this was secondary to his Symbicort because it stopped when he discontinued this medication. He denies vision changes.  Patient also complaining of shortness of breath for 2 months. It is gradual, persistent and gradually worsening. He reports a history of COPD for which he takes Symbicort and albuterol. He states this is constant and is not associated with the cough or wheezing. He reports approximately 2 months ago he was changed from Advair to Symbicort because of his insurance and he believes it is not working. He reports he has seen his primary care doctor for this several times in the last few months and they have not ever taken a chest x-ray. He reports they changed his medicine several times but he does not believe any of it is helping.  He has no chest pain. He reports he is still able to walk 3-4 miles without difficulty. He still works in the yard  without difficulty and was able to plant 10 trees without needing to rest.  Denies chest pain abdominal pain, nausea, vomiting.  Patient reports he is here today because he feels like his primary care doctor is not doing anything for him.  Past Medical History  Diagnosis Date  . COPD (chronic obstructive pulmonary disease)    Past Surgical History  Procedure Laterality Date  . Back surgery      Family History  Problem Relation Age of Onset  . Diabetes Father   . Diabetes Brother    History  Substance Use Topics  . Smoking status: Current Every Day Smoker -- 1.50 packs/day    Types: Cigarettes  . Smokeless tobacco: Never Used  . Alcohol Use: Yes     Comment: 2 times a week    Review of Systems  Constitutional: Positive for unexpected weight change (25 lbs in the last 1.5 years). Negative for fever, diaphoresis, appetite change and fatigue.  HENT: Negative for mouth sores.   Eyes: Positive for photophobia. Negative for visual disturbance.  Respiratory: Positive for shortness of breath. Negative for cough, chest tightness and wheezing.   Cardiovascular: Negative for chest pain.  Gastrointestinal: Negative for nausea, vomiting, abdominal pain, diarrhea and constipation.  Endocrine: Negative for polydipsia, polyphagia and polyuria.  Genitourinary: Negative for dysuria, urgency, frequency and hematuria.  Musculoskeletal: Negative for back pain and neck stiffness.  Skin: Negative for rash.  Allergic/Immunologic: Negative for immunocompromised state.  Neurological: Positive for headaches. Negative for syncope and light-headedness.  Hematological: Does not bruise/bleed easily.  Psychiatric/Behavioral: Negative for sleep disturbance. The patient is not nervous/anxious.       Allergies  Erythromycin base  Home Medications   Current Outpatient Rx  Name  Route  Sig  Dispense  Refill  . albuterol (PROVENTIL HFA;VENTOLIN HFA) 108 (90 BASE) MCG/ACT inhaler   Inhalation   Inhale 2 puffs into the lungs every 6 (six) hours as needed for wheezing or shortness of breath.         . Aspirin-Acetaminophen (GOODY BODY PAIN) 500-325 MG PACK   Oral   Take 1 Package by mouth every 6 (six) hours as needed (pain).         . budesonide-formoterol (SYMBICORT) 160-4.5 MCG/ACT inhaler   Inhalation   Inhale 2 puffs into the lungs 2 (two) times daily.         . citalopram (CELEXA)  40 MG tablet   Oral   Take 40 mg by mouth daily.         . clonazePAM (KLONOPIN) 1 MG tablet   Oral   Take 1 mg by mouth 2 (two) times daily as needed for anxiety.         Marland Kitchen HYDROcodone-acetaminophen (NORCO/VICODIN) 5-325 MG per tablet   Oral   Take 1 tablet by mouth every 6 (six) hours as needed for moderate pain.         . traMADol (ULTRAM) 50 MG tablet   Oral   Take 50 mg by mouth every 6 (six) hours as needed for moderate pain.          BP 136/85  Pulse 93  Temp(Src) 97.6 F (36.4 C) (Oral)  Resp 18  SpO2 98% Physical Exam  Nursing note and vitals reviewed. Constitutional: He is oriented to person, place, and time. He appears well-developed and well-nourished. No distress.  Awake, alert, nontoxic appearance  HENT:  Head: Normocephalic and atraumatic.  Right Ear: Tympanic membrane, external ear and ear canal normal.  Left Ear: Tympanic membrane, external ear and ear canal normal.  Nose: Mucosal edema and rhinorrhea present. No epistaxis. Right sinus exhibits no maxillary sinus tenderness and no frontal sinus tenderness. Left sinus exhibits no maxillary sinus tenderness and no frontal sinus tenderness.  Mouth/Throat: Uvula is midline, oropharynx is clear and moist and mucous membranes are normal. Mucous membranes are not pale and not cyanotic. No oropharyngeal exudate, posterior oropharyngeal edema, posterior oropharyngeal erythema or tonsillar abscesses.  Eyes: Conjunctivae and EOM are normal. Pupils are equal, round, and reactive to light. No scleral icterus.  Neck: Normal range of motion and full passive range of motion without pain. Neck supple. No spinous process tenderness and no muscular tenderness present. No rigidity. Normal range of motion present.  Full Range of motion without pain No midline or paraspinal tenderness  Cardiovascular: Normal rate, regular rhythm, normal heart sounds and intact distal pulses.   No murmur heard. Regular rate and rhythm   Pulmonary/Chest: Effort normal and breath sounds normal. No stridor. No respiratory distress. He has no wheezes. He has no rales.  Course breath sounds but equal without rhonchi, Rales or wheezes  No respiratory distress  Abdominal: Soft. Bowel sounds are normal. He exhibits no distension and no mass. There is no tenderness. There is no rebound and no guarding.  Soft and nontender  Musculoskeletal: Normal range of motion. He exhibits no edema.  No peripheral edema No calf tenderness, negative Homans sign, no palpable cord  Lymphadenopathy:    He has no cervical adenopathy.  Neurological: He is alert and oriented to person, place, and time. He has normal reflexes. No cranial nerve deficit. He exhibits normal muscle tone. Coordination normal.  Speech is clear and goal oriented, follows commands Cranial nerves III - XII without deficit, no facial droop Normal strength in upper and lower extremities bilaterally, strong and equal grip strength Sensation normal to light and sharp touch Moves extremities without ataxia, coordination intact Normal finger to nose and rapid alternating movements Neg romberg, no pronator drift Normal gait Normal heel-shin and balance   Skin: Skin is warm and dry. No rash noted. He is not diaphoretic. No erythema.  Psychiatric: He has a normal mood and affect. His behavior is normal. Judgment and thought content normal.    ED Course  Procedures (including critical care time) Labs Review Labs Reviewed  CBC   Imaging Review No results found.  EKG Interpretation   None       MDM   Final diagnoses:  None   [name] presents with headaches and SOB for > 2 mos.  Headaches are consistent with tension headaches and patient is reporting significant stress at home since his job loss in June of last year. He reports the struggle with finances will not be resolved until next month and he is still unsure whether or not he will lose his home.  Patient  without neurologic deficit; normal neurologic exam.  Highly doubt CVA or TIA. Will obtain CT.  No dizziness or ataxia to create concern for cerebellar stroke.  Shortness of breath is also chronic for approximately 2 months or greater. He reports this has not prevented or changed his daily activities. He is not hypoxic, tachypneic or tachycardic here.  He has a history of COPD and continues to smoke 1.5 packs of cigarettes per day.  He was recently taken off his Advair and exchanged for Symbicort which he feels is not working.  Will obtain head CT, basic lab work and chest  x-ray.    6:25 PM Patient headache mildly improved with migraine cocktail.  On reevaluation he remains without focal neurologic deficit. Head CT unremarkable.   Chest x-ray without acute abnormality.  He reports history of lung nodule which I do not visualized today on the chest x-ray.  No evidence of pneumonia, pneumothorax or pulmonary edema.  I personally reviewed the imaging tests through PACS system  I reviewed available ER/hospitalization records through the EMR  Pt without hypoxia or tachycardia, no CP.  No clinical signs and symptoms of DVT. Highly doubt PE.  Patient stable here in the emergency department. I recommended close followup with primary care, neurology for further evaluation of his headaches and pulmonology for further evaluation of his shortness of breath and COPD.  His headaches are likely secondary to tension and stress will prescribe Valium for muscle relaxation and anxiety suppression.  It has been determined that no acute conditions requiring further emergency intervention are present at this time. The patient/guardian have been advised of the diagnosis and plan. We have discussed signs and symptoms that warrant return to the ED, such as changes or worsening in symptoms.   Vital signs are stable at discharge.   BP 120/68  Pulse 74  Temp(Src) 97.6 F (36.4 C) (Oral)  Resp 16  SpO2  97%  Patient/guardian has voiced understanding and agreed to follow-up with the PCP or specialist.      Abigail Butts, PA-C 05/23/13 1828

## 2013-05-23 NOTE — ED Notes (Addendum)
Patient states he has been having a headache with photophobia x 2 months, tingling of all extremities. Patient has a history of COPD and states he had bronchitis 2 months ago, but has continued to have SOB.

## 2013-05-25 NOTE — ED Provider Notes (Signed)
Medical screening examination/treatment/procedure(s) were performed by non-physician practitioner and as supervising physician I was immediately available for consultation/collaboration.  EKG Interpretation    Date/Time:  Sunday May 23 2013 17:36:04 EST Ventricular Rate:  64 PR Interval:  146 QRS Duration: 98 QT Interval:  430 QTC Calculation: 444 R Axis:   86 Text Interpretation:  Sinus rhythm Borderline right axis deviation Low voltage, precordial leads ED PHYSICIAN INTERPRETATION AVAILABLE IN [facility] Confirmed by TEST, RECORD ([id number]) on 05/25/2013 7:08:33 AM               Houston Siren III, MD 05/25/13 1357

## 2014-01-01 ENCOUNTER — Encounter: Payer: Self-pay | Admitting: Gastroenterology

## 2014-04-12 ENCOUNTER — Observation Stay (HOSPITAL_COMMUNITY)
Admission: EM | Admit: 2014-04-12 | Discharge: 2014-04-13 | Disposition: A | Discharge status: Home / Self Care | Payer: 59 | Attending: Internal Medicine | Admitting: Internal Medicine

## 2014-04-12 ENCOUNTER — Encounter (HOSPITAL_COMMUNITY): Payer: Self-pay

## 2014-04-12 ENCOUNTER — Observation Stay (HOSPITAL_COMMUNITY): Payer: 59

## 2014-04-12 ENCOUNTER — Emergency Department (HOSPITAL_COMMUNITY): Payer: 59

## 2014-04-12 DIAGNOSIS — F17210 Nicotine dependence, cigarettes, uncomplicated: Secondary | ICD-10-CM | POA: Insufficient documentation

## 2014-04-12 DIAGNOSIS — Z881 Allergy status to other antibiotic agents status: Secondary | ICD-10-CM | POA: Insufficient documentation

## 2014-04-12 DIAGNOSIS — R209 Unspecified disturbances of skin sensation: Secondary | ICD-10-CM

## 2014-04-12 DIAGNOSIS — Z79899 Other long term (current) drug therapy: Secondary | ICD-10-CM | POA: Diagnosis not present

## 2014-04-12 DIAGNOSIS — R079 Chest pain, unspecified: Principal | ICD-10-CM | POA: Diagnosis present

## 2014-04-12 DIAGNOSIS — R1013 Epigastric pain: Secondary | ICD-10-CM | POA: Diagnosis present

## 2014-04-12 DIAGNOSIS — I951 Orthostatic hypotension: Secondary | ICD-10-CM | POA: Insufficient documentation

## 2014-04-12 DIAGNOSIS — F17200 Nicotine dependence, unspecified, uncomplicated: Secondary | ICD-10-CM | POA: Diagnosis present

## 2014-04-12 DIAGNOSIS — J441 Chronic obstructive pulmonary disease with (acute) exacerbation: Secondary | ICD-10-CM | POA: Diagnosis present

## 2014-04-12 DIAGNOSIS — G894 Chronic pain syndrome: Secondary | ICD-10-CM | POA: Insufficient documentation

## 2014-04-12 DIAGNOSIS — Z720 Tobacco use: Secondary | ICD-10-CM

## 2014-04-12 DIAGNOSIS — R200 Anesthesia of skin: Secondary | ICD-10-CM | POA: Diagnosis present

## 2014-04-12 DIAGNOSIS — R0602 Shortness of breath: Secondary | ICD-10-CM

## 2014-04-12 DIAGNOSIS — R202 Paresthesia of skin: Secondary | ICD-10-CM

## 2014-04-12 HISTORY — DX: Anxiety disorder, unspecified: F41.9

## 2014-04-12 LAB — TROPONIN I
Troponin I: <0.03 ng/mL (ref <0.031)
Troponin I: <0.03 ng/mL (ref <0.031)
Troponin I: <0.03 ng/mL (ref <0.031)

## 2014-04-12 LAB — CBC WITH DIFFERENTIAL/PLATELET
Basophils Absolute: 0 10*3/uL (ref 0.0–0.1)
Basophils Relative: 0 % (ref 0–1)
EOS PCT: 0 % (ref 0–5)
Eosinophils Absolute: 0 10*3/uL (ref 0.0–0.7)
HCT: 47.6 % (ref 39.0–52.0)
Hemoglobin: 16.6 g/dL (ref 13.0–17.0)
LYMPHS ABS: 1.5 10*3/uL (ref 0.7–4.0)
LYMPHS PCT: 15 % (ref 12–46)
MCH: 33.7 pg (ref 26.0–34.0)
MCHC: 34.9 g/dL (ref 30.0–36.0)
MCV: 96.6 fL (ref 78.0–100.0)
MONO ABS: 0.6 10*3/uL (ref 0.1–1.0)
Monocytes Relative: 6 % (ref 3–12)
NEUTROS PCT: 79 % — AB (ref 43–77)
Neutro Abs: 7.7 10*3/uL (ref 1.7–7.7)
PLATELETS: 248 10*3/uL (ref 150–400)
RBC: 4.93 MIL/uL (ref 4.22–5.81)
RDW: 13.2 % (ref 11.5–15.5)
WBC: 9.8 10*3/uL (ref 4.0–10.5)

## 2014-04-12 LAB — CREATININE, SERUM
Creatinine, Ser: 0.81 mg/dL (ref 0.50–1.35)
GFR calc Af Amer: >90 mL/min (ref >90)
GFR calc non Af Amer: >90 mL/min (ref >90)

## 2014-04-12 LAB — COMPREHENSIVE METABOLIC PANEL
ALBUMIN: 4.4 g/dL (ref 3.5–5.2)
ALT: 24 U/L (ref 0–53)
ANION GAP: 17 — AB (ref 5–15)
AST: 34 U/L (ref 0–37)
Alkaline Phosphatase: 77 U/L (ref 39–117)
BUN: 19 mg/dL (ref 6–23)
CO2: 18 mmol/L — AB (ref 19–32)
CREATININE: 1.01 mg/dL (ref 0.50–1.35)
Calcium: 9.8 mg/dL (ref 8.4–10.5)
Chloride: 100 mEq/L (ref 96–112)
GFR calc Af Amer: 89 mL/min — ABNORMAL LOW (ref >90)
GFR calc non Af Amer: 77 mL/min — ABNORMAL LOW (ref >90)
Glucose, Bld: 182 mg/dL — ABNORMAL HIGH (ref 70–99)
Potassium: 4 mmol/L (ref 3.5–5.1)
SODIUM: 135 mmol/L (ref 135–145)
Total Bilirubin: 0.8 mg/dL (ref 0.3–1.2)
Total Protein: 7.8 g/dL (ref 6.0–8.3)

## 2014-04-12 LAB — CBC
HEMATOCRIT: 38.7 % — AB (ref 39.0–52.0)
HEMOGLOBIN: 12.8 g/dL — AB (ref 13.0–17.0)
MCH: 32.5 pg (ref 26.0–34.0)
MCHC: 33.1 g/dL (ref 30.0–36.0)
MCV: 98.2 fL (ref 78.0–100.0)
Platelets: 207 10*3/uL (ref 150–400)
RBC: 3.94 MIL/uL — ABNORMAL LOW (ref 4.22–5.81)
RDW: 13.2 % (ref 11.5–15.5)
WBC: 9.1 10*3/uL (ref 4.0–10.5)

## 2014-04-12 LAB — LIPID PANEL
CHOLESTEROL: 170 mg/dL (ref 0–200)
HDL: 58 mg/dL (ref >39)
LDL CALC: 99 mg/dL (ref 0–99)
TRIGLYCERIDES: 64 mg/dL (ref <150)
Total CHOL/HDL Ratio: 2.9 RATIO
VLDL: 13 mg/dL (ref 0–40)

## 2014-04-12 LAB — URINE MICROSCOPIC-ADD ON

## 2014-04-12 LAB — URINALYSIS, ROUTINE W REFLEX MICROSCOPIC
GLUCOSE, UA: NEGATIVE mg/dL
Hgb urine dipstick: NEGATIVE
Ketones, ur: >80 mg/dL — AB
Nitrite: NEGATIVE
PROTEIN: NEGATIVE mg/dL
Specific Gravity, Urine: 1.023 (ref 1.005–1.030)
UROBILINOGEN UA: 0.2 mg/dL (ref 0.0–1.0)
pH: 8 (ref 5.0–8.0)

## 2014-04-12 LAB — CBG MONITORING, ED: Glucose-Capillary: 166 mg/dL — ABNORMAL HIGH (ref 70–99)

## 2014-04-12 LAB — BRAIN NATRIURETIC PEPTIDE: B Natriuretic Peptide: 17.2 pg/mL (ref 0.0–100.0)

## 2014-04-12 LAB — LACTIC ACID, PLASMA: LACTIC ACID, VENOUS: 1 mmol/L (ref 0.5–2.2)

## 2014-04-12 LAB — D-DIMER, QUANTITATIVE: D-Dimer, Quant: <0.27 ug/mL-FEU (ref 0.00–0.48)

## 2014-04-12 LAB — LIPASE, BLOOD: Lipase: 34 U/L (ref 11–59)

## 2014-04-12 MED ORDER — LEVOFLOXACIN 750 MG PO TABS
750.0000 mg | ORAL_TABLET | Freq: Every day | ORAL | Status: DC
  Filled 2014-04-12 (×2): qty 1

## 2014-04-12 MED ORDER — ALUM & MAG HYDROXIDE-SIMETH 200-200-20 MG/5ML PO SUSP: 30.0000 mL | Freq: Four times a day (QID) | ORAL | Status: DC | PRN

## 2014-04-12 MED ORDER — NICOTINE 21 MG/24HR TD PT24
21.0000 mg | MEDICATED_PATCH | TRANSDERMAL | Status: DC
  Filled 2014-04-12 (×2): qty 1

## 2014-04-12 MED ORDER — ONDANSETRON HCL 4 MG/2ML IJ SOLN
4.0000 mg | Freq: Once | INTRAMUSCULAR | Status: AC
  Administered 2014-04-12: 4 mg via INTRAVENOUS
  Filled 2014-04-12: qty 2

## 2014-04-12 MED ORDER — ONDANSETRON HCL 4 MG/2ML IJ SOLN: 4.0000 mg | Freq: Four times a day (QID) | INTRAMUSCULAR | Status: DC | PRN

## 2014-04-12 MED ORDER — IPRATROPIUM BROMIDE 0.02 % IN SOLN
0.5000 mg | Freq: Four times a day (QID) | RESPIRATORY_TRACT | Status: DC
  Administered 2014-04-12 – 2014-04-13 (×2): 0.5 mg via RESPIRATORY_TRACT
  Filled 2014-04-12 (×2): qty 2.5

## 2014-04-12 MED ORDER — HYDROCODONE-ACETAMINOPHEN 5-325 MG PO TABS
1.0000 | ORAL_TABLET | ORAL | Status: DC | PRN
  Administered 2014-04-12 – 2014-04-13 (×2): 2 via ORAL
  Filled 2014-04-12 (×2): qty 2

## 2014-04-12 MED ORDER — ENOXAPARIN SODIUM 40 MG/0.4ML ~~LOC~~ SOLN
40.0000 mg | SUBCUTANEOUS | Status: DC
  Administered 2014-04-12: 40 mg via SUBCUTANEOUS
  Filled 2014-04-12 (×2): qty 0.4

## 2014-04-12 MED ORDER — IOHEXOL 300 MG/ML  SOLN
100.0000 mL | Freq: Once | INTRAMUSCULAR | Status: AC | PRN
  Administered 2014-04-12: 100 mL via INTRAVENOUS

## 2014-04-12 MED ORDER — ACETAMINOPHEN 650 MG RE SUPP: 650.0000 mg | Freq: Four times a day (QID) | RECTAL | Status: DC | PRN

## 2014-04-12 MED ORDER — TRAMADOL HCL 50 MG PO TABS: 50.0000 mg | ORAL_TABLET | Freq: Four times a day (QID) | ORAL | Status: DC | PRN

## 2014-04-12 MED ORDER — CLONAZEPAM 1 MG PO TABS
1.0000 mg | ORAL_TABLET | Freq: Two times a day (BID) | ORAL | Status: DC | PRN
  Administered 2014-04-12: 1 mg via ORAL
  Filled 2014-04-12: qty 1

## 2014-04-12 MED ORDER — MORPHINE SULFATE 4 MG/ML IJ SOLN
4.0000 mg | Freq: Once | INTRAMUSCULAR | Status: AC
  Administered 2014-04-12: 4 mg via INTRAVENOUS
  Filled 2014-04-12: qty 1

## 2014-04-12 MED ORDER — SODIUM CHLORIDE 0.9 % IJ SOLN
3.0000 mL | Freq: Two times a day (BID) | INTRAMUSCULAR | Status: DC
  Administered 2014-04-12: 3 mL via INTRAVENOUS

## 2014-04-12 MED ORDER — SODIUM CHLORIDE 0.9 % IV SOLN
INTRAVENOUS | Status: AC
  Administered 2014-04-12: 13:00:00 via INTRAVENOUS

## 2014-04-12 MED ORDER — ASPIRIN EC 325 MG PO TBEC
325.0000 mg | DELAYED_RELEASE_TABLET | Freq: Every day | ORAL | Status: DC
  Administered 2014-04-12 – 2014-04-13 (×2): 325 mg via ORAL
  Filled 2014-04-12 (×2): qty 1

## 2014-04-12 MED ORDER — ONDANSETRON HCL 4 MG PO TABS: 4.0000 mg | ORAL_TABLET | Freq: Four times a day (QID) | ORAL | Status: DC | PRN

## 2014-04-12 MED ORDER — HYDROMORPHONE HCL 1 MG/ML IJ SOLN: 1.0000 mg | INTRAMUSCULAR | Status: DC | PRN

## 2014-04-12 MED ORDER — ZOLPIDEM TARTRATE 5 MG PO TABS
5.0000 mg | ORAL_TABLET | Freq: Every evening | ORAL | Status: DC | PRN
  Administered 2014-04-12: 5 mg via ORAL
  Filled 2014-04-12: qty 1

## 2014-04-12 MED ORDER — ACETAMINOPHEN 325 MG PO TABS: 650.0000 mg | ORAL_TABLET | Freq: Four times a day (QID) | ORAL | Status: DC | PRN

## 2014-04-12 MED ORDER — SODIUM CHLORIDE 0.9 % IV SOLN
INTRAVENOUS | Status: DC
  Administered 2014-04-12 – 2014-04-13 (×2): via INTRAVENOUS

## 2014-04-12 MED ORDER — ENSURE COMPLETE PO LIQD
237.0000 mL | Freq: Two times a day (BID) | ORAL | Status: DC
  Administered 2014-04-12 – 2014-04-13 (×2): 237 mL via ORAL

## 2014-04-12 MED ORDER — SODIUM CHLORIDE 0.9 % IV BOLUS (SEPSIS)
1000.0000 mL | Freq: Once | INTRAVENOUS | Status: AC
  Administered 2014-04-12: 1000 mL via INTRAVENOUS

## 2014-04-12 MED ORDER — LEVALBUTEROL HCL 0.63 MG/3ML IN NEBU
0.6300 mg | INHALATION_SOLUTION | Freq: Four times a day (QID) | RESPIRATORY_TRACT | Status: DC
  Administered 2014-04-12 – 2014-04-13 (×2): 0.63 mg via RESPIRATORY_TRACT
  Filled 2014-04-12 (×6): qty 3

## 2014-04-12 MED ORDER — BUDESONIDE-FORMOTEROL FUMARATE 160-4.5 MCG/ACT IN AERO
2.0000 | INHALATION_SPRAY | Freq: Two times a day (BID) | RESPIRATORY_TRACT | Status: DC
  Administered 2014-04-12 – 2014-04-13 (×2): 2 via RESPIRATORY_TRACT
  Filled 2014-04-12: qty 6

## 2014-04-12 MED ORDER — PANTOPRAZOLE SODIUM 40 MG IV SOLR
40.0000 mg | Freq: Two times a day (BID) | INTRAVENOUS | Status: DC
  Administered 2014-04-12 – 2014-04-13 (×2): 40 mg via INTRAVENOUS
  Filled 2014-04-12 (×3): qty 40

## 2014-04-12 MED ORDER — CITALOPRAM HYDROBROMIDE 40 MG PO TABS
40.0000 mg | ORAL_TABLET | Freq: Every day | ORAL | Status: DC
  Administered 2014-04-13: 40 mg via ORAL
  Filled 2014-04-12 (×2): qty 1

## 2014-04-12 NOTE — ED Provider Notes (Signed)
CSN: [id number]     Arrival date & time 04/12/14  2951 History   First MD Initiated Contact with Patient 04/12/14 [phone]     Chief Complaint  Patient presents with  . Chest Pain     (Consider location/radiation/quality/duration/timing/severity/associated sxs/prior Treatment) HPI Comments: Patient arrives via EMS with chest pain and shortness of breath. He states he's had constant sternal chest pain since last night that does not radiate. He also has epigastric pain. He felt short of breath this morning but was able to go to work. He became more short of breath after he drove to his office and could not get out of his car because a had breathing trouble and his feet were tingling. EMS gave him albuterol 1. His lungs are clear on arrival. He has a history of COPD but states he only takes Advair as needed. Denies using any other medicines for his breathing. Denies any leg pain or leg swelling by his legs feel tingly. He has had constant chest pain since last night that is worse with palpation. Denies any nausea, vomiting or fever.   The history is provided by the EMS personnel, a relative and the patient. The history is limited by the condition of the patient.    Past Medical History  Diagnosis Date  . COPD (chronic obstructive pulmonary disease)    Past Surgical History  Procedure Laterality Date  . Back surgery     Family History  Problem Relation Age of Onset  . Diabetes Father   . Diabetes Brother    History  Substance Use Topics  . Smoking status: Current Every Day Smoker -- 1.50 packs/day    Types: Cigarettes  . Smokeless tobacco: Never Used  . Alcohol Use: Yes     Comment: 2 times a week    Review of Systems  Constitutional: Positive for activity change and appetite change. Negative for fever.  HENT: Negative for congestion, rhinorrhea and voice change.   Respiratory: Positive for cough, chest tightness and shortness of breath.   Cardiovascular: Positive for chest pain.  Negative for leg swelling.  Gastrointestinal: Negative for nausea, vomiting and abdominal pain.  Genitourinary: Negative for dysuria, urgency and hematuria.  Musculoskeletal: Negative for myalgias, arthralgias and neck pain.  Skin: Negative for rash.  Neurological: Negative for dizziness, weakness and headaches.  A complete 10 system review of systems was obtained and all systems are negative except as noted in the HPI and PMH.      Allergies  Erythromycin base  Home Medications   Prior to Admission medications   Medication Sig Start Date End Date Taking? Authorizing Provider  Aspirin-Acetaminophen (GOODY BODY PAIN) 500-325 MG PACK Take 1 Package by mouth every 6 (six) hours as needed (pain).   Yes Historical Provider, MD  citalopram (CELEXA) 40 MG tablet Take 40 mg by mouth daily.   Yes Historical Provider, MD  clonazePAM (KLONOPIN) 1 MG tablet Take 1 mg by mouth 2 (two) times daily as needed for anxiety.   Yes Historical Provider, MD  traMADol (ULTRAM) 50 MG tablet Take 50 mg by mouth every 6 (six) hours as needed for moderate pain.   Yes Historical Provider, MD  albuterol (PROVENTIL HFA;VENTOLIN HFA) 108 (90 BASE) MCG/ACT inhaler Inhale 2 puffs into the lungs every 6 (six) hours as needed for wheezing or shortness of breath.    Historical Provider, MD  budesonide-formoterol (SYMBICORT) 160-4.5 MCG/ACT inhaler Inhale 2 puffs into the lungs 2 (two) times daily.    Historical Provider,  MD  diazepam (VALIUM) 5 MG tablet Take 1 tablet (5 mg total) by mouth every 8 (eight) hours as needed for anxiety (or headache). Patient not taking: Reported on 04/12/2014 05/23/13   Jarrett Soho Muthersbaugh, PA-C  HYDROcodone-acetaminophen (NORCO/VICODIN) 5-325 MG per tablet Take 1 tablet by mouth every 6 (six) hours as needed for moderate pain.    Historical Provider, MD   BP 123/86 mmHg  Temp(Src) 97.6 F (36.4 C) (Oral)  Resp 25  Ht 6\' 1"  (1.854 m)  Wt 122 lb (55.339 kg)  BMI 16.10 kg/m2  SpO2  100% Physical Exam  Constitutional: He is oriented to person, place, and time. He appears well-developed and well-nourished. He appears distressed.  Mildly increased work of breathing, appears anxious  HENT:  Head: Normocephalic and atraumatic.  Mouth/Throat: Oropharynx is clear and moist. No oropharyngeal exudate.  Eyes: Conjunctivae and EOM are normal. Pupils are equal, round, and reactive to light.  Neck: Normal range of motion. Neck supple.  No meningismus  Cardiovascular: Normal rate, regular rhythm, normal heart sounds and intact distal pulses.   No murmur heard. +2 femoral, DP, PT pulses  Pulmonary/Chest: He is in respiratory distress. He has no wheezes. He exhibits tenderness.  tachypneic but lungs are clear, no hypoxia  Abdominal: Soft. There is no tenderness. There is no rebound and no guarding.  Musculoskeletal: Normal range of motion. He exhibits no edema or tenderness.  No calf swelling, tenderness, or palpable cords  Neurological: He is alert and oriented to person, place, and time. No cranial nerve deficit. He exhibits normal muscle tone. Coordination normal.  5/5 strength throughout, CN 2-12 intact, no ataxia on finger to nose, no nystagmus  Skin: Skin is warm and dry. No rash noted.  Psychiatric: He has a normal mood and affect. His behavior is normal.  Nursing note and vitals reviewed.   ED Course  Procedures (including critical care time) Labs Review Labs Reviewed  CBG MONITORING, ED - Abnormal; Notable for the following:    Glucose-Capillary 166 (*)    All other components within normal limits  CBC WITH DIFFERENTIAL  COMPREHENSIVE METABOLIC PANEL  TROPONIN I  D-DIMER, QUANTITATIVE  BRAIN NATRIURETIC PEPTIDE    Imaging Review Dg Chest Portable 1 View  04/12/2014   CLINICAL DATA:  Shortness of breath, abdominal pain  EXAM: PORTABLE CHEST - 1 VIEW  COMPARISON:  05/23/2013  FINDINGS: Cardiomediastinal silhouette is stable. Hyperinflation again noted. Stable  bilateral apical pleural parenchymal scarring right greater than left. No acute infiltrate or pulmonary edema. Minimal degenerative changes thoracic spine.  IMPRESSION: Hyperinflation again noted. Stable bilateral apical pleural parenchymal scarring right greater than left. No acute infiltrate or pulmonary edema.   Electronically Signed   By: Lahoma Crocker M.D.   On: 04/12/2014 09:25     EKG Interpretation None      MDM   Final diagnoses:  SOB (shortness of breath)   Acute onset of shortness of breath with chest pain and epigastric pain. Lungs clear on arrival. Patient appears anxious. Chest x-ray shows stable hyperinflation. No infiltrate. No wheezing.  Hold steroids at this time.  EKG normal sinus rhythm without acute ST changes.  Chest pain is atypical for ACS, d-dimer negative. Tender epigatrium on exam which patient is vague about and says he has had for years. Lipase and LFTs normal.  Possible GERD versus gastritis.   Orthostatic with standing.  HR 120s.  IVF given. CT abdomen will be obtained for ongoing epigastric pain. Admission for rehydration and further  evaluation d/w Dr. Tana Coast.   Date: 04/12/2014  Rate: 97  Rhythm: normal sinus rhythm  QRS Axis: normal  Intervals: normal  ST/T Wave abnormalities: nonspecific ST/T changes  Conduction Disutrbances:none  Narrative Interpretation:   Old EKG Reviewed: unchanged    Ezequiel Essex, MD 04/12/14 1758

## 2014-04-12 NOTE — Progress Notes (Signed)
Pt taken off neb tx, empty. BBS clear/diminished. MD at bedside

## 2014-04-12 NOTE — ED Notes (Signed)
Per [facility]: pt. Presents to ED with complaint of new onset CP to center of chest. Pt. Has bilateral arm tingling/SOB/Nausea/lightheadness/back pain. EMS denies neuro symptoms. Smokes 1.5 PPD. Hx COPD. ST on the monitor. EMS gave albuterol neb x1. EMS states wheezes and crackles throughout.

## 2014-04-12 NOTE — H&P (Signed)
History and Physical       Hospital Admission Note Date: 04/12/2014  Patient name: [name] Medical record number: [id number] Date of birth: 07/27/1950 Age: 64 y.o. Gender: male  PCP: Maurice Small, D, MD    Chief Complaint:  Chest pain or shortness of breath, epigastric pain  HPI: Patient is a 63 year old male with history of COPD/emphysema, chronic smoker, alcohol use, chronic pain syndrome presented to ED with chest pain, shortness of breath, epigastric pain, dizziness, numbness and tingling. Patient is a poor and vague historian who reports that he has been having chronic chest pain and epigastric pain for "years" and went to work this morning. Patient reports that he couldn't get out of his car as he was short of breath and pain was worse than usual. Patient also reported nausea, with lightheadedness and dizziness, numbness and tingling in the arms and legs. At the time of my encounter, chest pain improved however patient still has epigastric pain. He reports prior cardiac workup including cardiac cath was negative, had endoscopies in the past with equal GI which were also normal. Troponin 2 in ED negative, lipase normal 34, d-dimer normal  Review of Systems:  Constitutional: Denies fever, chills, diaphoresis, poor appetite and fatigue.  HEENT: Denies photophobia, eye pain, redness, hearing loss, ear pain, congestion, sore throat, rhinorrhea, sneezing, mouth sores, trouble swallowing, neck pain, neck stiffness and tinnitus.   Respiratory: Please see history of present illness, denies any exertional dyspnea, uses inhalers every day, smokes 2 packs every day Cardiovascular: Please see history of present illness Gastrointestinal: Denies vomiting, diarrhea, constipation, blood in stool and abdominal distention. + nausea with epigastric pain Genitourinary: Denies dysuria, urgency, frequency, hematuria, flank pain and difficulty  urinating.  Musculoskeletal: Denies myalgias, joint swelling, arthralgias and gait problem. + chronic back pain Skin: Denies pallor, rash and wound.  Neurological: Denies  seizures, syncope, weakness, and headaches. + dizziness with numbness and tingling in the legs and arms which is now improving at the time of my encounter Hematological: Denies adenopathy. Easy bruising, personal or family bleeding history  Psychiatric/Behavioral: Denies suicidal ideation, mood changes, confusion, nervousness, sleep disturbance and agitation  Past Medical History: Past Medical History  Diagnosis Date  . COPD (chronic obstructive pulmonary disease)    Past Surgical History  Procedure Laterality Date  . Back surgery      Medications: Prior to Admission medications   Medication Sig Start Date End Date Taking? Authorizing Provider  ADVAIR DISKUS 250-50 MCG/DOSE AEPB Inhale 1 puff into the lungs 2 (two) times daily. 01/25/14  Yes Historical Provider, MD  Aspirin-Acetaminophen (GOODY BODY PAIN) 500-325 MG PACK Take 1 Package by mouth every 6 (six) hours as needed (pain).   Yes Historical Provider, MD  citalopram (CELEXA) 40 MG tablet Take 40 mg by mouth daily.   Yes Historical Provider, MD  clonazePAM (KLONOPIN) 1 MG tablet Take 1 mg by mouth 2 (two) times daily as needed for anxiety.   Yes Historical Provider, MD  traMADol (ULTRAM) 50 MG tablet Take 50 mg by mouth every 6 (six) hours as needed for moderate pain.   Yes Historical Provider, MD  budesonide-formoterol (SYMBICORT) 160-4.5 MCG/ACT inhaler Inhale 2 puffs into the lungs 2 (two) times daily.    Historical Provider, MD  diazepam (VALIUM) 5 MG tablet Take 1 tablet (5 mg total) by mouth every 8 (eight) hours as needed for anxiety (or headache). Patient not taking: Reported on 04/12/2014 05/23/13   Jarrett Soho Muthersbaugh, PA-C    Allergies:  Allergies  Allergen Reactions  . Erythromycin Base Other (See Comments)    hallucinations    Social  History:  reports that he has been smoking Cigarettes.  He has been smoking about 1.50 packs per day. He has never used smokeless tobacco. He reports that he drinks alcohol. He reports that he does not use illicit drugs.  Family History: Family History  Problem Relation Age of Onset  . Diabetes Father   . Diabetes Brother     Physical Exam: Blood pressure 106/71, pulse 89, temperature 97.6 F (36.4 C), temperature source Oral, resp. rate 20, height 6\' 1"  (1.854 m), weight 55.339 kg (122 lb), SpO2 99 %. General: Alert, awake, oriented x3, in no acute distress. HEENT: normocephalic, atraumatic, anicteric sclera, pink conjunctiva, pupils equal and reactive to light and accomodation, oropharynx clear Neck: supple, no masses or lymphadenopathy, no goiter, no bruits  Heart: Regular rate and rhythm, without murmurs, rubs or gallops. Lungs: Decreased breath sounds throughout but no rhonchi or wheezing Abdomen: Soft, mild epigastric tenderness to deep palpation, nondistended, positive bowel sounds, no masses. Extremities: No clubbing, cyanosis or edema with positive pedal pulses. Neuro: Grossly intact, no focal neurological deficits, strength 5/5 upper and lower extremities bilaterally Psych: alert and oriented x 3, normal mood and affect Skin: no rashes or lesions, warm and dry   LABS on Admission:  Basic Metabolic Panel:  Recent Labs Lab 04/12/14 0915  NA 135  K 4.0  CL 100  CO2 18*  GLUCOSE 182*  BUN 19  CREATININE 1.01  CALCIUM 9.8   Liver Function Tests:  Recent Labs Lab 04/12/14 0915  AST 34  ALT 24  ALKPHOS 77  BILITOT 0.8  PROT 7.8  ALBUMIN 4.4   No results for input(s): LIPASE, AMYLASE in the last 168 hours. No results for input(s): AMMONIA in the last 168 hours. CBC:  Recent Labs Lab 04/12/14 0915  WBC 9.8  NEUTROABS 7.7  HGB 16.6  HCT 47.6  MCV 96.6  PLT 248   Cardiac Enzymes:  Recent Labs Lab 04/12/14 0915  TROPONINI <0.03   BNP: Invalid  input(s): POCBNP CBG:  Recent Labs Lab 04/12/14 0909  GLUCAP 166*     Radiological Exams on Admission: Dg Chest Portable 1 View  04/12/2014   CLINICAL DATA:  Shortness of breath, abdominal pain  EXAM: PORTABLE CHEST - 1 VIEW  COMPARISON:  05/23/2013  FINDINGS: Cardiomediastinal silhouette is stable. Hyperinflation again noted. Stable bilateral apical pleural parenchymal scarring right greater than left. No acute infiltrate or pulmonary edema. Minimal degenerative changes thoracic spine.  IMPRESSION: Hyperinflation again noted. Stable bilateral apical pleural parenchymal scarring right greater than left. No acute infiltrate or pulmonary edema.   Electronically Signed   By: Lahoma Crocker M.D.   On: 04/12/2014 09:25    EKG showed sinus rhythm, rate 97, no acute ST-T wave changes suggestive of ischemia.  Assessment/Plan Principal Problem: Atypical chest pain associated with dyspnea, epigastric pain: Risk factors include smoking - Patient reports chronic chest pain for years, troponin x2 negative, may have underlying GI component - Place on telemetry, obtain serial cardiac enzymes, given associated dyspnea, obtain 2-D echocardiogram for further workup - D-dimer normal - Placed on IV PPI , aspirin  Active Problems:   TOBACCO ABUSE - Patient smokes 1-1/2-2 packs every day, counseled strongly on smoking cessation, place on nicotine patch    EPIGASTRIC PAIN: Possibility of PUD, GERD. Patient reports being recently on antibiotics and steroids earlier this month for acute bronchitis/sinusitis infection -  Place on IV PPI, await CT abdomen and pelvis results, lipase normal - May need GI consultation  COPD exacerbation - Continue Symbicort, incentive spirometry, scheduled DuoNeb's, Levaquin - Holding off on steroids due to no wheezing at the time of my examination    Numbness and tingling - Patient is extremely vague historian reports sudden numbness and tingling in both arms and legs this  morning, occasionally has numbness in the legs, appears to be neuropathy. However given his history with dizziness, rule out any stroke - Continue aspirin, obtain lipid panel, obtain MRI of the brain. If MRI is negative, will not pursue any further neurology workup.   DVT prophylaxis: Lovenox  CODE STATUS: DO NOT RESUSCITATE, discussed in detail with the patient  Family Communication: Admission, patients condition and plan of care including tests being ordered have been discussed with the patient and wife who indicates understanding and agree with the plan and Code Status   Further plan will depend as patient's clinical course evolves and further radiologic and laboratory data become available.   Time Spent on Admission: 1 hour  Coda Mathey M.D. [facility] 04/12/2014, 1:41 PM Pager: [phone]  If 7PM-7AM, please contact night-coverage [email or web address] Password [email or web address]

## 2014-04-13 DIAGNOSIS — R209 Unspecified disturbances of skin sensation: Secondary | ICD-10-CM

## 2014-04-13 DIAGNOSIS — I951 Orthostatic hypotension: Secondary | ICD-10-CM

## 2014-04-13 DIAGNOSIS — R208 Other disturbances of skin sensation: Secondary | ICD-10-CM

## 2014-04-13 DIAGNOSIS — R072 Precordial pain: Secondary | ICD-10-CM

## 2014-04-13 DIAGNOSIS — R1013 Epigastric pain: Secondary | ICD-10-CM | POA: Insufficient documentation

## 2014-04-13 LAB — URINE CULTURE
Colony Count: NO GROWTH
Culture: NO GROWTH

## 2014-04-13 LAB — BASIC METABOLIC PANEL
Anion gap: 6 (ref 5–15)
BUN: 20 mg/dL (ref 6–23)
CHLORIDE: 105 meq/L (ref 96–112)
CO2: 26 mmol/L (ref 19–32)
Calcium: 8.3 mg/dL — ABNORMAL LOW (ref 8.4–10.5)
Creatinine, Ser: 0.84 mg/dL (ref 0.50–1.35)
GFR calc Af Amer: >90 mL/min (ref >90)
GFR calc non Af Amer: >90 mL/min (ref >90)
Glucose, Bld: 100 mg/dL — ABNORMAL HIGH (ref 70–99)
Potassium: 4.3 mmol/L (ref 3.5–5.1)
SODIUM: 137 mmol/L (ref 135–145)

## 2014-04-13 LAB — CBC
HEMATOCRIT: 37 % — AB (ref 39.0–52.0)
Hemoglobin: 12.5 g/dL — ABNORMAL LOW (ref 13.0–17.0)
MCH: 34.1 pg — ABNORMAL HIGH (ref 26.0–34.0)
MCHC: 33.8 g/dL (ref 30.0–36.0)
MCV: 100.8 fL — AB (ref 78.0–100.0)
PLATELETS: 186 10*3/uL (ref 150–400)
RBC: 3.67 MIL/uL — ABNORMAL LOW (ref 4.22–5.81)
RDW: 13.6 % (ref 11.5–15.5)
WBC: 6 10*3/uL (ref 4.0–10.5)

## 2014-04-13 LAB — TROPONIN I

## 2014-04-13 MED ORDER — DOCUSATE SODIUM 100 MG PO CAPS
100.0000 mg | ORAL_CAPSULE | Freq: Two times a day (BID) | ORAL | Status: DC
  Administered 2014-04-13: 100 mg via ORAL
  Filled 2014-04-13: qty 1

## 2014-04-13 MED ORDER — ASPIRIN EC 81 MG PO TBEC: 81.0000 mg | DELAYED_RELEASE_TABLET | Freq: Every day | ORAL | Status: DC

## 2014-04-13 MED ORDER — DSS 100 MG PO CAPS: 100.0000 mg | ORAL_CAPSULE | Freq: Two times a day (BID) | ORAL | Status: DC

## 2014-04-13 MED ORDER — SENNA 8.6 MG PO TABS
2.0000 | ORAL_TABLET | Freq: Every day | ORAL | Status: DC
  Administered 2014-04-13: 17.2 mg via ORAL
  Filled 2014-04-13 (×2): qty 2

## 2014-04-13 MED ORDER — OMEPRAZOLE 40 MG PO CPDR: 40.0000 mg | DELAYED_RELEASE_CAPSULE | Freq: Every day | ORAL | Status: DC

## 2014-04-13 MED ORDER — SENNA 8.6 MG PO TABS: 2.0000 | ORAL_TABLET | Freq: Every day | ORAL | Status: DC

## 2014-04-13 NOTE — Discharge Summary (Addendum)
Physician Discharge Summary  [name] ZOX:[id number] DOB: [birth year] DOA: 04/12/2014  PCP: Maurice Small, D, MD  Admit date: 04/12/2014 Discharge date: 04/13/2014  Recommendations for Outpatient Follow-up:  1. Pt will need to follow up with PCP in 2 weeks post discharge 2. Please obtain BMP and CBC in one week   Discharge Diagnoses:  Atypical chest pain -Troponins negative 5 -EKG without any ST-T wave changes -Suspect underlying GI component as the patient takes 2-3 Goody's powders daily -D-dimer normal -follow up with PCP to set up out pt stress test if clinically indicated -Echocardiogram EF 55-60%, no WMA Epigastric pain -Suspect underlying gastritis from the patient's use of Goody's powders -Continue PPI after d/c -Discussed with the patient to discontinue use of NSAIDS/Goody's -Tolerating diet -Lipase normal -CT abdomen and pelvis negative for acute findings except for moderate stool in the cecum and right colon Sensory disturbance -Resolved -MRI brain negative for acute stroke -Continue aspirin daily 81 mg -LDL 99 COPD -No wheezing on examination -Continue Symbicort Continue bronchodilators as needed- Tobacco abuse -Tobacco cessation discussed Constipation -Discussed with the patient the importance of staying on a bowel regimen at home -colace and senna Discharge Condition:  Stable  Disposition: Home  Diet: Heart healthy  Wt Readings from Last 3 Encounters:  04/13/14 50.44 kg (111 lb 3.2 oz)  06/27/08 60.442 kg (133 lb 4 oz)  05/23/08 58.233 kg (128 lb 6.1 oz)    History of present illness:  64 year old male with a history of COPD, continued tobacco use, alcohol use, chronic pain presented to emergency department with sudden onset of chest pain when he woke up in the morning. His chest pain got worse as he was driving to work with some associated shortness of breath. He also began having numbness and tingling in the bilateral upper arms and legs. By the  time he got to work, the patient stated that he was having some chest pain and shortness of breath, that he was not able to get out of his car. Since hospitalization, the patient's chest pain is improved, but continues to have intermittent epigastric pain. Troponins, lipase, d-dimer were unremarkable. EKG did not have any ST-T wave changes.    Discharge Exam: Filed Vitals:   04/13/14 0403  BP: 101/62  Pulse: 60  Temp: 98.1 F (36.7 C)  Resp: 18   Filed Vitals:   04/12/14 2022 04/12/14 2026 04/13/14 0403 04/13/14 0615  BP: 112/63  101/62   Pulse: 62  60   Temp: 98 F (36.7 C)  98.1 F (36.7 C)   TempSrc: Oral  Oral   Resp: 18  18   Height:      Weight:    50.44 kg (111 lb 3.2 oz)  SpO2: 99% 99% 100%    General: A&O x 3, NAD, pleasant, cooperative Cardiovascular: RRR, no rub, no gallop, no S3 Respiratory: Diminished breath sounds at the bases. No wheezing.  Abdomen:soft, epigastric tender without guarding or peritoneal signs, nondistended, positive bowel sounds Extremities: No edema, No lymphangitis, no petechiae  Discharge Instructions      Discharge Instructions    Diet - low sodium heart healthy    Complete by:  As directed      Increase activity slowly    Complete by:  As directed             Medication List    STOP taking these medications        ADVAIR DISKUS 250-50 MCG/DOSE Aepb  Generic drug:  Fluticasone-Salmeterol  diazepam 5 MG tablet  Commonly known as:  VALIUM     GOODY BODY PAIN 500-325 MG Pack  Generic drug:  Aspirin-Acetaminophen      TAKE these medications        aspirin EC 81 MG tablet  Take 1 tablet (81 mg total) by mouth daily.     budesonide-formoterol 160-4.5 MCG/ACT inhaler  Commonly known as:  SYMBICORT  Inhale 2 puffs into the lungs 2 (two) times daily.     citalopram 40 MG tablet  Commonly known as:  CELEXA  Take 40 mg by mouth daily.     clonazePAM 1 MG tablet  Commonly known as:  KLONOPIN  Take 1 mg by mouth 2  (two) times daily as needed for anxiety.     DSS 100 MG Caps  Take 100 mg by mouth 2 (two) times daily.     omeprazole 40 MG capsule  Commonly known as:  PRILOSEC  Take 1 capsule (40 mg total) by mouth daily.     senna 8.6 MG Tabs tablet  Commonly known as:  SENOKOT  Take 2 tablets (17.2 mg total) by mouth daily.     traMADol 50 MG tablet  Commonly known as:  ULTRAM  Take 50 mg by mouth every 6 (six) hours as needed for moderate pain.         The results of significant diagnostics from this hospitalization (including imaging, microbiology, ancillary and laboratory) are listed below for reference.    Significant Diagnostic Studies: Mr Brain Wo Contrast  04/12/2014   CLINICAL DATA:  Lightheaded and dizziness beginning earlier today. Initial encounter. History of COPD.  EXAM: MRI HEAD WITHOUT CONTRAST  TECHNIQUE: Multiplanar, multiecho pulse sequences of the brain and surrounding structures were obtained without intravenous contrast.  COMPARISON:  CT head 05/23/2013.  FINDINGS: The patient was unable to remain motionless for the exam. Small or subtle lesions could be overlooked.  No evidence for acute infarction, hemorrhage, mass lesion, hydrocephalus, or extra-axial fluid. Mild generalized atrophy. Mild subcortical and periventricular T2 and FLAIR hyperintensities, likely chronic microvascular ischemic change. Partial empty sella. Pineal and cerebellar tonsils unremarkable. No upper cervical lesions. Visualized calvarium, skull base, and upper cervical osseous structures unremarkable. Scalp and extracranial soft tissues, orbits, and mastoids show no acute process. BILATERAL acute maxillary sinusitis.  Similar appearance to prior CT when technique differences are considered.  IMPRESSION: Motion degraded exam.  Mild atrophy and small vessel disease. No acute intracranial abnormality.   Electronically Signed   By: Rolla Flatten M.D.   On: 04/12/2014 17:26   Ct Abdomen Pelvis W  Contrast  04/12/2014   CLINICAL DATA:  Upper abdominal pain, chest pain for 1 month  EXAM: CT ABDOMEN AND PELVIS WITH CONTRAST  TECHNIQUE: Multidetector CT imaging of the abdomen and pelvis was performed using the standard protocol following bolus administration of intravenous contrast.  CONTRAST:  171mL OMNIPAQUE IOHEXOL 300 MG/ML  SOLN  COMPARISON:  CTA chest and abdomen 02/04/2008  FINDINGS: Sagittal images of the spine shows degenerative changes lumbar spine with disc space flattening endplate sclerotic changes and vacuum disc phenomenon at L3-L4 and L4-L5 level.  Lung bases shows mild emphysematous changes. Mild hyperinflation. Enhanced liver shows no focal mass. No calcified gallstones are noted within gallbladder. The pancreas, spleen and adrenal glands are unremarkable. Atherosclerotic calcifications of abdominal aorta and iliac arteries. No aortic aneurysm. There is prior left laminectomy at L4 level.  Enhanced kidneys are symmetrical in size. No hydronephrosis or hydroureter.  Delayed  renal images shows bilateral renal symmetrical excretion. Bilateral visualized proximal ureter is unremarkable.  No small bowel obstruction. Moderate stool noted in right colon. There is a low lying cecum. Abundant stool is noted within cecum. Normal appendix is clearly visualized in coronal image 69.  No ascites or free air. No adenopathy. Prostate gland and seminal vesicles are unremarkable. No urinary bladder filling defects are identified. No inguinal adenopathy. No destructive bony lesions are noted within pelvis. Limited assessment of the colon not opacified with contrast.  IMPRESSION: 1. Mild emphysematous changes are noted lung bases. 2. No acute inflammatory process within abdomen or pelvis. 3. Atherosclerotic calcifications of abdominal aorta and iliac arteries. 4. Abundant stool noted within cecum. There is a low lying cecum. No pericecal inflammation. Normal appendix is clearly visualize. 5. No small bowel  obstruction. 6. No hydronephrosis or hydroureter. Bilateral renal symmetrical excretion. 7. Degenerative changes lumbar spine. Postsurgical changes lumbar spine L4 level.   Electronically Signed   By: Lahoma Crocker M.D.   On: 04/12/2014 13:51   Dg Chest Portable 1 View  04/12/2014   CLINICAL DATA:  Shortness of breath, abdominal pain  EXAM: PORTABLE CHEST - 1 VIEW  COMPARISON:  05/23/2013  FINDINGS: Cardiomediastinal silhouette is stable. Hyperinflation again noted. Stable bilateral apical pleural parenchymal scarring right greater than left. No acute infiltrate or pulmonary edema. Minimal degenerative changes thoracic spine.  IMPRESSION: Hyperinflation again noted. Stable bilateral apical pleural parenchymal scarring right greater than left. No acute infiltrate or pulmonary edema.   Electronically Signed   By: Lahoma Crocker M.D.   On: 04/12/2014 09:25     Microbiology: No results found for this or any previous visit (from the past 240 hour(s)).   Labs: Basic Metabolic Panel:  Recent Labs Lab 04/12/14 0915 04/12/14 1525 04/13/14 0120  NA 135  --  137  K 4.0  --  4.3  CL 100  --  105  CO2 18*  --  26  GLUCOSE 182*  --  100*  BUN 19  --  20  CREATININE 1.01 0.81 0.84  CALCIUM 9.8  --  8.3*   Liver Function Tests:  Recent Labs Lab 04/12/14 0915  AST 34  ALT 24  ALKPHOS 77  BILITOT 0.8  PROT 7.8  ALBUMIN 4.4    Recent Labs Lab 04/12/14 1243  LIPASE 34   No results for input(s): AMMONIA in the last 168 hours. CBC:  Recent Labs Lab 04/12/14 0915 04/12/14 1525 04/13/14 0120  WBC 9.8 9.1 6.0  NEUTROABS 7.7  --   --   HGB 16.6 12.8* 12.5*  HCT 47.6 38.7* 37.0*  MCV 96.6 98.2 100.8*  PLT 248 207 186   Cardiac Enzymes:  Recent Labs Lab 04/12/14 0915 04/12/14 1243 04/12/14 1525 04/12/14 1930 04/13/14 0120  TROPONINI <0.03 <0.03 <0.03 <0.03 <0.03   BNP: Invalid input(s): POCBNP CBG:  Recent Labs Lab 04/12/14 0909  GLUCAP 166*    Time coordinating  discharge:  Greater than 30 minutes  Signed:  Kyiesha Millward, DO [facility] Pager: [phone] 04/13/2014, 8:53 AM

## 2014-04-13 NOTE — Progress Notes (Signed)
  Echocardiogram 2D Echocardiogram has been performed.  Diamond Nickel 04/13/2014, 10:33 AM

## 2014-04-13 NOTE — Progress Notes (Signed)
Discharge education completed by RN. Pt and spouse received a copy of discharge paperwork and confirm understanding of follow up appointments and discharge medications. Both deny any questions at this time. IV removed, site is within normal limits. Pt will discharge from the unit via wheelchair. 

## 2014-04-13 NOTE — Progress Notes (Signed)
UR completed 

## 2014-04-18 ENCOUNTER — Encounter: Payer: Self-pay | Admitting: Gastroenterology

## 2016-03-13 DIAGNOSIS — H353131 Nonexudative age-related macular degeneration, bilateral, early dry stage: Secondary | ICD-10-CM | POA: Diagnosis not present

## 2016-03-13 DIAGNOSIS — H2511 Age-related nuclear cataract, right eye: Secondary | ICD-10-CM | POA: Diagnosis not present

## 2016-03-13 DIAGNOSIS — H2513 Age-related nuclear cataract, bilateral: Secondary | ICD-10-CM | POA: Diagnosis not present

## 2016-03-13 DIAGNOSIS — H25811 Combined forms of age-related cataract, right eye: Secondary | ICD-10-CM | POA: Diagnosis not present

## 2016-03-13 DIAGNOSIS — Z01818 Encounter for other preprocedural examination: Secondary | ICD-10-CM | POA: Diagnosis not present

## 2016-03-18 DIAGNOSIS — H2513 Age-related nuclear cataract, bilateral: Secondary | ICD-10-CM | POA: Diagnosis not present

## 2016-03-18 DIAGNOSIS — H2512 Age-related nuclear cataract, left eye: Secondary | ICD-10-CM | POA: Diagnosis not present

## 2016-03-18 DIAGNOSIS — H25812 Combined forms of age-related cataract, left eye: Secondary | ICD-10-CM | POA: Diagnosis not present

## 2016-05-10 DIAGNOSIS — Z0000 Encounter for general adult medical examination without abnormal findings: Secondary | ICD-10-CM | POA: Diagnosis not present

## 2016-05-10 DIAGNOSIS — Z125 Encounter for screening for malignant neoplasm of prostate: Secondary | ICD-10-CM | POA: Diagnosis not present

## 2016-05-10 DIAGNOSIS — I700 Atherosclerosis of aorta: Secondary | ICD-10-CM | POA: Diagnosis not present

## 2016-05-10 DIAGNOSIS — Z5181 Encounter for therapeutic drug level monitoring: Secondary | ICD-10-CM | POA: Diagnosis not present

## 2016-05-10 DIAGNOSIS — E291 Testicular hypofunction: Secondary | ICD-10-CM | POA: Diagnosis not present

## 2016-05-10 DIAGNOSIS — E782 Mixed hyperlipidemia: Secondary | ICD-10-CM | POA: Diagnosis not present

## 2016-05-10 DIAGNOSIS — R7303 Prediabetes: Secondary | ICD-10-CM | POA: Diagnosis not present

## 2016-05-13 ENCOUNTER — Encounter: Payer: Self-pay | Admitting: Gastroenterology

## 2016-05-14 ENCOUNTER — Other Ambulatory Visit: Payer: Self-pay | Admitting: Family Medicine

## 2016-05-14 DIAGNOSIS — Z136 Encounter for screening for cardiovascular disorders: Secondary | ICD-10-CM

## 2016-05-14 DIAGNOSIS — F17200 Nicotine dependence, unspecified, uncomplicated: Secondary | ICD-10-CM

## 2016-05-23 ENCOUNTER — Ambulatory Visit
Admission: RE | Admit: 2016-05-23 | Discharge: 2016-05-23 | Disposition: A | Discharge status: Home / Self Care | Payer: Medicare Other | Source: Ambulatory Visit | Attending: Family Medicine | Admitting: Family Medicine

## 2016-05-23 DIAGNOSIS — Z136 Encounter for screening for cardiovascular disorders: Secondary | ICD-10-CM | POA: Diagnosis not present

## 2016-05-23 DIAGNOSIS — Z8489 Family history of other specified conditions: Secondary | ICD-10-CM | POA: Diagnosis not present

## 2016-07-19 ENCOUNTER — Ambulatory Visit (AMBULATORY_SURGERY_CENTER): Payer: Self-pay | Admitting: *Deleted

## 2016-07-19 VITALS — Ht 73.0 in | Wt 119.8 lb

## 2016-07-19 DIAGNOSIS — Z86010 Personal history of colonic polyps: Secondary | ICD-10-CM

## 2016-07-19 MED ORDER — NA SULFATE-K SULFATE-MG SULF 17.5-3.13-1.6 GM/177ML PO SOLN: ORAL | 0 refills | Status: DC

## 2016-07-19 NOTE — Progress Notes (Signed)
Pt denies allergies to eggs or soy products. Denies difficulty with sedation or anesthesia. Denies any diet or weight loss medications. Denies use of supplemental oxygen.  No Email.

## 2016-08-02 ENCOUNTER — Encounter: Payer: Self-pay | Admitting: Gastroenterology

## 2016-08-02 ENCOUNTER — Ambulatory Visit (AMBULATORY_SURGERY_CENTER): Payer: Medicare Other | Admitting: Gastroenterology

## 2016-08-02 VITALS — BP 113/63 | HR 60 | Temp 97.5°F | Resp 16 | Ht 73.0 in | Wt 119.0 lb

## 2016-08-02 DIAGNOSIS — Z86010 Personal history of colonic polyps: Secondary | ICD-10-CM

## 2016-08-02 DIAGNOSIS — D125 Benign neoplasm of sigmoid colon: Secondary | ICD-10-CM

## 2016-08-02 DIAGNOSIS — D123 Benign neoplasm of transverse colon: Secondary | ICD-10-CM | POA: Diagnosis not present

## 2016-08-02 DIAGNOSIS — D127 Benign neoplasm of rectosigmoid junction: Secondary | ICD-10-CM

## 2016-08-02 DIAGNOSIS — K635 Polyp of colon: Secondary | ICD-10-CM

## 2016-08-02 DIAGNOSIS — J449 Chronic obstructive pulmonary disease, unspecified: Secondary | ICD-10-CM | POA: Diagnosis not present

## 2016-08-02 MED ORDER — SODIUM CHLORIDE 0.9 % IV SOLN: 500.0000 mL | INTRAVENOUS | Status: DC

## 2016-08-02 NOTE — Patient Instructions (Signed)
Impression/Recommendations:  Polyp handout given to patient.  Repeat colonoscopy in 3 years for surveillance pending pathology review.  No aspiring, ibuprofen, naproxen, or other NSAID drugs for 2 weeks.   Tylenol only until Aug 17, 2016.  YOU HAD AN ENDOSCOPIC PROCEDURE TODAY AT [facility]:   Refer to the procedure report that was given to you for any specific questions about what was found during the examination.  If the procedure report does not answer your questions, please call your gastroenterologist to clarify.  If you requested that your care partner not be given the details of your procedure findings, then the procedure report has been included in a sealed envelope for you to review at your convenience later.  YOU SHOULD EXPECT: Some feelings of bloating in the abdomen. Passage of more gas than usual.  Walking can help get rid of the air that was put into your GI tract during the procedure and reduce the bloating. If you had a lower endoscopy (such as a colonoscopy or flexible sigmoidoscopy) you may notice spotting of blood in your stool or on the toilet paper. If you underwent a bowel prep for your procedure, you may not have a normal bowel movement for a few days.  Please Note:  You might notice some irritation and congestion in your nose or some drainage.  This is from the oxygen used during your procedure.  There is no need for concern and it should clear up in a day or so.  SYMPTOMS TO REPORT IMMEDIATELY:   Following lower endoscopy (colonoscopy or flexible sigmoidoscopy):  Excessive amounts of blood in the stool  Significant tenderness or worsening of abdominal pains  Swelling of the abdomen that is new, acute  Fever of 100F or higher   For urgent or emergent issues, a gastroenterologist can be reached at any hour by calling [phone].   DIET:  We do recommend a small meal at first, but then you may proceed to your regular diet.  Drink plenty of  fluids but you should avoid alcoholic beverages for 24 hours.  ACTIVITY:  You should plan to take it easy for the rest of today and you should NOT DRIVE or use heavy machinery until tomorrow (because of the sedation medicines used during the test).    FOLLOW UP: Our staff will call the number listed on your records the next business day following your procedure to check on you and address any questions or concerns that you may have regarding the information given to you following your procedure. If we do not reach you, we will leave a message.  However, if you are feeling well and you are not experiencing any problems, there is no need to return our call.  We will assume that you have returned to your regular daily activities without incident.  If any biopsies were taken you will be contacted by phone or by letter within the next 1-3 weeks.  Please call us at [phone] if you have not heard about the biopsies in 3 weeks.    SIGNATURES/CONFIDENTIALITY: You and/or your care partner have signed paperwork which will be entered into your electronic medical record.  These signatures attest to the fact that that the information above on your After Visit Summary has been reviewed and is understood.  Full responsibility of the confidentiality of this discharge information lies with you and/or your care-partner.

## 2016-08-02 NOTE — Progress Notes (Signed)
Report to PACU, RN, vss, BBS= Clear.  

## 2016-08-02 NOTE — Progress Notes (Signed)
Called to room to assist during endoscopic procedure.  Patient ID and intended procedure confirmed with present staff. Received instructions for my participation in the procedure from the performing physician.  

## 2016-08-02 NOTE — Progress Notes (Signed)
Pt's states no medical or surgical changes since previsit or office visit. 

## 2016-08-02 NOTE — Op Note (Signed)
[facility] Patient Name: [name] Procedure Date: 08/02/2016 10:00 AM MRN: [id number] Endoscopist: Ladene Artist , MD Age: 66 Referring MD:  Date of Birth: Jan 23, 1951 Gender: Male Account #: [id number] Procedure:                Colonoscopy Indications:              Surveillance: Personal history of adenomatous                            polyps on last colonoscopy > 5 years ago Medicines:                Monitored Anesthesia Care Procedure:                Pre-Anesthesia Assessment:                           - Prior to the procedure, a History and Physical                            was performed, and patient medications and                            allergies were reviewed. The patient's tolerance of                            previous anesthesia was also reviewed. The risks                            and benefits of the procedure and the sedation                            options and risks were discussed with the patient.                            All questions were answered, and informed consent                            was obtained. Prior Anticoagulants: The patient has                            taken no previous anticoagulant or antiplatelet                            agents. ASA Grade Assessment: II - A patient with                            mild systemic disease. After reviewing the risks                            and benefits, the patient was deemed in                            satisfactory condition to undergo the procedure.  After obtaining informed consent, the colonoscope                            was passed under direct vision. Throughout the                            procedure, the patient's blood pressure, pulse, and                            oxygen saturations were monitored continuously. The                            Model PCF-H190DL [id number]) scope was introduced                            through the anus and  advanced to the the cecum,                            identified by appendiceal orifice and ileocecal                            valve. The ileocecal valve, appendiceal orifice,                            and rectum were photographed. The quality of the                            bowel preparation was good. The colonoscopy was                            performed without difficulty. The patient tolerated                            the procedure well. Scope In: 10:21:26 AM Scope Out: 10:39:04 AM Scope Withdrawal Time: 0 hours 11 minutes 43 seconds  Total Procedure Duration: 0 hours 17 minutes 38 seconds  Findings:                 The perianal and digital rectal examinations were                            normal.                           A 17 mm polyp was found in the recto-sigmoid colon.                            The polyp was sessile. The polyp was removed with a                            hot snare. Resection and retrieval were complete.                           Three sessile polyps were found in the sigmoid  colon (2) and transverse colon (1). The polyps were                            6 to 7 mm in size. These polyps were removed with a                            cold snare. Resection and retrieval were complete.                           A 9 mm polyp was found in the transverse colon. The                            polyp was sessile. The polyp was removed with a hot                            snare. Resection and retrieval were complete.                           The exam was otherwise without abnormality on                            direct and retroflexion views. Complications:            No immediate complications. Estimated blood loss:                            None. Estimated Blood Loss:     Estimated blood loss: none. Impression:               - One 17 mm polyp at the recto-sigmoid colon,                            removed with a hot snare.  Resected and retrieved.                           - Three 6 to 7 mm polyps in the sigmoid colon and                            in the transverse colon, removed with a cold snare.                            Resected and retrieved.                           - One 9 mm polyp in the transverse colon, removed                            with a hot snare. Resected and retrieved.                           - The examination was otherwise normal on direct  and retroflexion views. Recommendation:           - Repeat colonoscopy in 3 years for surveillance                            pending pathology review.                           - Patient has a contact number available for                            emergencies. The signs and symptoms of potential                            delayed complications were discussed with the                            patient. Return to normal activities tomorrow.                            Written discharge instructions were provided to the                            patient.                           - Resume previous diet.                           - Continue present medications.                           - Await pathology results.                           - No aspirin, ibuprofen, naproxen, or other                            non-steroidal anti-inflammatory drugs for 2 weeks                            after polyp removal. Ladene Artist, MD 08/02/2016 10:50:49 AM This report has been signed electronically.

## 2016-08-05 ENCOUNTER — Telehealth: Payer: Self-pay | Admitting: *Deleted

## 2016-08-05 ENCOUNTER — Telehealth: Payer: Self-pay

## 2016-08-05 NOTE — Telephone Encounter (Signed)
Left a message at [phone] for the pt to let them know we called to check on them from their procedure. maw

## 2016-08-05 NOTE — Telephone Encounter (Signed)
No answer. Number identifier. Message left to call if questions or concerns. 

## 2016-08-15 ENCOUNTER — Encounter: Payer: Self-pay | Admitting: Gastroenterology

## 2016-08-21 ENCOUNTER — Telehealth: Payer: Self-pay | Admitting: Gastroenterology

## 2016-08-21 NOTE — Telephone Encounter (Signed)
I left a message with the results of the pathology and that I have resent a copy of the letter. He is asked to call back if he has additional questions.

## 2016-10-14 DIAGNOSIS — H6980 Other specified disorders of Eustachian tube, unspecified ear: Secondary | ICD-10-CM | POA: Diagnosis not present

## 2016-12-08 DIAGNOSIS — R071 Chest pain on breathing: Secondary | ICD-10-CM | POA: Diagnosis not present

## 2016-12-23 DIAGNOSIS — F17210 Nicotine dependence, cigarettes, uncomplicated: Secondary | ICD-10-CM | POA: Diagnosis not present

## 2016-12-23 DIAGNOSIS — R0781 Pleurodynia: Secondary | ICD-10-CM | POA: Diagnosis not present

## 2016-12-23 DIAGNOSIS — J181 Lobar pneumonia, unspecified organism: Secondary | ICD-10-CM | POA: Diagnosis not present

## 2016-12-25 DIAGNOSIS — J181 Lobar pneumonia, unspecified organism: Secondary | ICD-10-CM | POA: Diagnosis not present

## 2016-12-25 DIAGNOSIS — Z23 Encounter for immunization: Secondary | ICD-10-CM | POA: Diagnosis not present

## 2016-12-31 DIAGNOSIS — J189 Pneumonia, unspecified organism: Secondary | ICD-10-CM | POA: Diagnosis not present

## 2016-12-31 DIAGNOSIS — M7660 Achilles tendinitis, unspecified leg: Secondary | ICD-10-CM | POA: Diagnosis not present

## 2017-01-05 DIAGNOSIS — M79661 Pain in right lower leg: Secondary | ICD-10-CM | POA: Diagnosis not present

## 2017-01-05 DIAGNOSIS — M79671 Pain in right foot: Secondary | ICD-10-CM | POA: Diagnosis not present

## 2017-01-05 DIAGNOSIS — S86001A Unspecified injury of right Achilles tendon, initial encounter: Secondary | ICD-10-CM | POA: Diagnosis not present

## 2017-01-07 DIAGNOSIS — S86011S Strain of right Achilles tendon, sequela: Secondary | ICD-10-CM | POA: Diagnosis not present

## 2017-01-29 DIAGNOSIS — S86011S Strain of right Achilles tendon, sequela: Secondary | ICD-10-CM | POA: Diagnosis not present

## 2017-03-12 DIAGNOSIS — M1711 Unilateral primary osteoarthritis, right knee: Secondary | ICD-10-CM | POA: Diagnosis not present

## 2017-04-23 DIAGNOSIS — M79604 Pain in right leg: Secondary | ICD-10-CM | POA: Diagnosis not present

## 2017-04-28 DIAGNOSIS — M545 Low back pain: Secondary | ICD-10-CM | POA: Diagnosis not present

## 2017-04-28 DIAGNOSIS — M5416 Radiculopathy, lumbar region: Secondary | ICD-10-CM | POA: Diagnosis not present

## 2017-05-01 DIAGNOSIS — M533 Sacrococcygeal disorders, not elsewhere classified: Secondary | ICD-10-CM | POA: Diagnosis not present

## 2017-05-01 DIAGNOSIS — M79604 Pain in right leg: Secondary | ICD-10-CM | POA: Diagnosis not present

## 2017-05-01 DIAGNOSIS — M5416 Radiculopathy, lumbar region: Secondary | ICD-10-CM | POA: Diagnosis not present

## 2017-05-05 DIAGNOSIS — M79604 Pain in right leg: Secondary | ICD-10-CM | POA: Diagnosis not present

## 2017-05-05 DIAGNOSIS — M25551 Pain in right hip: Secondary | ICD-10-CM | POA: Diagnosis not present

## 2017-05-22 DIAGNOSIS — M545 Low back pain: Secondary | ICD-10-CM | POA: Diagnosis not present

## 2017-05-22 DIAGNOSIS — M5416 Radiculopathy, lumbar region: Secondary | ICD-10-CM | POA: Diagnosis not present

## 2017-05-23 DIAGNOSIS — F17200 Nicotine dependence, unspecified, uncomplicated: Secondary | ICD-10-CM | POA: Diagnosis not present

## 2017-05-23 DIAGNOSIS — E782 Mixed hyperlipidemia: Secondary | ICD-10-CM | POA: Diagnosis not present

## 2017-05-23 DIAGNOSIS — R7303 Prediabetes: Secondary | ICD-10-CM | POA: Diagnosis not present

## 2017-05-23 DIAGNOSIS — Z0000 Encounter for general adult medical examination without abnormal findings: Secondary | ICD-10-CM | POA: Diagnosis not present

## 2017-05-23 DIAGNOSIS — G894 Chronic pain syndrome: Secondary | ICD-10-CM | POA: Diagnosis not present

## 2017-05-23 DIAGNOSIS — Z125 Encounter for screening for malignant neoplasm of prostate: Secondary | ICD-10-CM | POA: Diagnosis not present

## 2017-05-23 DIAGNOSIS — Z5181 Encounter for therapeutic drug level monitoring: Secondary | ICD-10-CM | POA: Diagnosis not present

## 2017-05-23 DIAGNOSIS — F3341 Major depressive disorder, recurrent, in partial remission: Secondary | ICD-10-CM | POA: Diagnosis not present

## 2017-05-23 DIAGNOSIS — Z136 Encounter for screening for cardiovascular disorders: Secondary | ICD-10-CM | POA: Diagnosis not present

## 2017-05-23 DIAGNOSIS — I700 Atherosclerosis of aorta: Secondary | ICD-10-CM | POA: Diagnosis not present

## 2017-05-29 DIAGNOSIS — M79604 Pain in right leg: Secondary | ICD-10-CM | POA: Diagnosis not present

## 2017-05-29 DIAGNOSIS — M5416 Radiculopathy, lumbar region: Secondary | ICD-10-CM | POA: Diagnosis not present

## 2017-06-06 DIAGNOSIS — M1711 Unilateral primary osteoarthritis, right knee: Secondary | ICD-10-CM | POA: Diagnosis not present

## 2017-08-28 DIAGNOSIS — M25511 Pain in right shoulder: Secondary | ICD-10-CM | POA: Diagnosis not present

## 2017-08-28 DIAGNOSIS — M7521 Bicipital tendinitis, right shoulder: Secondary | ICD-10-CM | POA: Diagnosis not present

## 2017-09-10 ENCOUNTER — Ambulatory Visit
Admission: RE | Admit: 2017-09-10 | Discharge: 2017-09-10 | Disposition: A | Discharge status: Home / Self Care | Payer: Medicare Other | Source: Ambulatory Visit | Attending: Family Medicine | Admitting: Family Medicine

## 2017-09-10 ENCOUNTER — Encounter: Payer: Self-pay | Admitting: Neurology

## 2017-09-10 ENCOUNTER — Other Ambulatory Visit: Payer: Self-pay | Admitting: Family Medicine

## 2017-09-10 DIAGNOSIS — E538 Deficiency of other specified B group vitamins: Secondary | ICD-10-CM | POA: Diagnosis not present

## 2017-09-10 DIAGNOSIS — J984 Other disorders of lung: Secondary | ICD-10-CM | POA: Diagnosis not present

## 2017-09-10 DIAGNOSIS — E440 Moderate protein-calorie malnutrition: Secondary | ICD-10-CM | POA: Diagnosis not present

## 2017-09-10 DIAGNOSIS — R634 Abnormal weight loss: Secondary | ICD-10-CM

## 2017-09-10 DIAGNOSIS — Z125 Encounter for screening for malignant neoplasm of prostate: Secondary | ICD-10-CM | POA: Diagnosis not present

## 2017-09-10 DIAGNOSIS — R5382 Chronic fatigue, unspecified: Secondary | ICD-10-CM | POA: Diagnosis not present

## 2017-09-10 DIAGNOSIS — F3341 Major depressive disorder, recurrent, in partial remission: Secondary | ICD-10-CM | POA: Diagnosis not present

## 2017-10-08 DIAGNOSIS — S8001XA Contusion of right knee, initial encounter: Secondary | ICD-10-CM | POA: Diagnosis not present

## 2017-10-08 DIAGNOSIS — M1711 Unilateral primary osteoarthritis, right knee: Secondary | ICD-10-CM | POA: Diagnosis not present

## 2017-11-12 DIAGNOSIS — E039 Hypothyroidism, unspecified: Secondary | ICD-10-CM | POA: Diagnosis not present

## 2017-11-19 ENCOUNTER — Ambulatory Visit: Payer: Medicare Other | Admitting: Neurology

## 2017-12-31 DIAGNOSIS — M1711 Unilateral primary osteoarthritis, right knee: Secondary | ICD-10-CM | POA: Diagnosis not present

## 2018-01-07 DIAGNOSIS — M1711 Unilateral primary osteoarthritis, right knee: Secondary | ICD-10-CM | POA: Diagnosis not present

## 2018-02-21 DIAGNOSIS — M1711 Unilateral primary osteoarthritis, right knee: Secondary | ICD-10-CM | POA: Diagnosis not present

## 2018-02-21 DIAGNOSIS — M1712 Unilateral primary osteoarthritis, left knee: Secondary | ICD-10-CM | POA: Diagnosis not present

## 2018-04-07 DIAGNOSIS — F3341 Major depressive disorder, recurrent, in partial remission: Secondary | ICD-10-CM | POA: Diagnosis not present

## 2018-04-07 DIAGNOSIS — F5101 Primary insomnia: Secondary | ICD-10-CM | POA: Diagnosis not present

## 2018-05-04 DIAGNOSIS — F3341 Major depressive disorder, recurrent, in partial remission: Secondary | ICD-10-CM | POA: Diagnosis not present

## 2018-06-29 DIAGNOSIS — R399 Unspecified symptoms and signs involving the genitourinary system: Secondary | ICD-10-CM | POA: Diagnosis not present

## 2018-06-29 DIAGNOSIS — E538 Deficiency of other specified B group vitamins: Secondary | ICD-10-CM | POA: Diagnosis not present

## 2018-06-29 DIAGNOSIS — R2689 Other abnormalities of gait and mobility: Secondary | ICD-10-CM | POA: Diagnosis not present

## 2018-06-29 DIAGNOSIS — R5382 Chronic fatigue, unspecified: Secondary | ICD-10-CM | POA: Diagnosis not present

## 2018-07-01 ENCOUNTER — Other Ambulatory Visit: Payer: Self-pay

## 2018-07-01 ENCOUNTER — Emergency Department (HOSPITAL_COMMUNITY)
Admission: EM | Admit: 2018-07-01 | Discharge: 2018-07-01 | Disposition: A | Discharge status: Home / Self Care | Payer: Medicare Other | Attending: Emergency Medicine | Admitting: Emergency Medicine

## 2018-07-01 ENCOUNTER — Encounter (HOSPITAL_COMMUNITY): Payer: Self-pay

## 2018-07-01 ENCOUNTER — Emergency Department (HOSPITAL_COMMUNITY): Payer: Medicare Other

## 2018-07-01 DIAGNOSIS — Z79899 Other long term (current) drug therapy: Secondary | ICD-10-CM | POA: Insufficient documentation

## 2018-07-01 DIAGNOSIS — J449 Chronic obstructive pulmonary disease, unspecified: Secondary | ICD-10-CM | POA: Insufficient documentation

## 2018-07-01 DIAGNOSIS — R42 Dizziness and giddiness: Secondary | ICD-10-CM | POA: Diagnosis not present

## 2018-07-01 DIAGNOSIS — F17210 Nicotine dependence, cigarettes, uncomplicated: Secondary | ICD-10-CM | POA: Insufficient documentation

## 2018-07-01 DIAGNOSIS — R270 Ataxia, unspecified: Secondary | ICD-10-CM | POA: Diagnosis not present

## 2018-07-01 DIAGNOSIS — R531 Weakness: Secondary | ICD-10-CM | POA: Diagnosis present

## 2018-07-01 LAB — CBC
HEMATOCRIT: 41.6 % (ref 39.0–52.0)
HEMOGLOBIN: 13.5 g/dL (ref 13.0–17.0)
MCH: 32.3 pg (ref 26.0–34.0)
MCHC: 32.5 g/dL (ref 30.0–36.0)
MCV: 99.5 fL (ref 80.0–100.0)
Platelets: 285 10*3/uL (ref 150–400)
RBC: 4.18 MIL/uL — ABNORMAL LOW (ref 4.22–5.81)
RDW: 13.1 % (ref 11.5–15.5)
WBC: 5.4 10*3/uL (ref 4.0–10.5)
nRBC: 0 % (ref 0.0–0.2)

## 2018-07-01 LAB — COMPREHENSIVE METABOLIC PANEL
ALBUMIN: 3.1 g/dL — AB (ref 3.5–5.0)
ALK PHOS: 69 U/L (ref 38–126)
ALT: 14 U/L (ref 0–44)
AST: 20 U/L (ref 15–41)
Anion gap: 10 (ref 5–15)
BILIRUBIN TOTAL: 0.5 mg/dL (ref 0.3–1.2)
BUN: 16 mg/dL (ref 8–23)
CHLORIDE: 101 mmol/L (ref 98–111)
CO2: 27 mmol/L (ref 22–32)
Calcium: 9 mg/dL (ref 8.9–10.3)
Creatinine, Ser: 0.73 mg/dL (ref 0.61–1.24)
GFR calc Af Amer: >60 mL/min (ref >60)
GFR calc non Af Amer: >60 mL/min (ref >60)
GLUCOSE: 123 mg/dL — AB (ref 70–99)
POTASSIUM: 3.4 mmol/L — AB (ref 3.5–5.1)
SODIUM: 138 mmol/L (ref 135–145)
Total Protein: 6.8 g/dL (ref 6.5–8.1)

## 2018-07-01 NOTE — ED Notes (Signed)
Patient transported to MRI 

## 2018-07-01 NOTE — Discharge Instructions (Addendum)
Please follow-up with your primary care physician for ongoing evaluation

## 2018-07-01 NOTE — ED Notes (Signed)
Pt made aware of the need for urine.

## 2018-07-01 NOTE — ED Notes (Signed)
Patient returned from MRI.

## 2018-07-01 NOTE — ED Notes (Signed)
Pt reminded of the need for urine.  

## 2018-07-01 NOTE — ED Triage Notes (Signed)
Pt sent here from PCP for stroke workup. Pt saw his PCP on Monday due to generalized weakness and slight dizziness for the past week, denies fever. Pt a.o. no weakness, facial droop or slurred speech noted.

## 2018-07-03 NOTE — ED Provider Notes (Signed)
[facility] EMERGENCY DEPARTMENT Provider Note   CSN: [id number] Arrival date & time: 07/01/18  1243    History   Chief Complaint Chief Complaint  Patient presents with  . Weakness  . Dizziness    HPI [name] is a 68 y.o. male.     HPI Patient is a 68 year old male who has been seeing his primary care physician for generalized weakness and ataxia for the past week.  No fever.  No chills.  No decreased oral intake.  Denies weakness of his arms or legs.  No difficulty with his speech.  No history of stroke.  Patient continued to be symptomatic and called his primary care physician today recommended they come to the ER for imaging.  He denies chest pain.  No shortness of breath.  Denies abdominal pain.  No other complaints at this time.  He feels slightly off balance and ataxic when walking.  I personally walked with him in the hall.   Past Medical History:  Diagnosis Date  . Anxiety   . Arthritis   . Cataract   . COPD (chronic obstructive pulmonary disease) (HCC)    inhaler  . Depression   . 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 Sensory disturbance 04/13/2014  . Epigastric pain   . Orthostasis   . COPD exacerbation ([place]) 04/12/2014  . Chest pain 04/12/2014  . Numbness and tingling 04/12/2014  . CHEST PAIN 06/27/2008  . ABNORMAL FINDINGS GI TRACT 06/27/2008  . COLONIC POLYPS, HX OF 06/27/2008  . WEIGHT LOSS 05/23/2008  . EPIGASTRIC PAIN 05/23/2008  . NONSPEC ABN FINDNG RAD & OTH EXAM ABDOMINAL AREA 05/23/2008  . TOBACCO ABUSE 03/30/2007  . EMPHYSEMA 03/30/2007  . ASTHMA 03/30/2007  . CHRONIC OBSTRUCTIVE PULMONARY DISEASE, MILD 03/30/2007  . OSTEOARTHRITIS 03/30/2007  . ARTHRITIS 03/30/2007  . [place] DISEASE, 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    Past Surgical History:  Procedure Laterality Date  . BACK SURGERY    . LUMBAR DISC SURGERY Left 06/2003   L4 on L3  hemilaminectomy, foraminotomy for 3-4,4-5 with left L3-L4 extraforaminal diskectomy/notes 08/22/2010   . REPAIR DURAL / CSF LEAK  07/2003   Archie Endo 08/22/2010        Home Medications    Prior to Admission medications   Medication Sig Start Date End Date Taking? Authorizing Provider  citalopram (CELEXA) 40 MG tablet Take 40 mg by mouth daily.    [provider]  clonazePAM (KLONOPIN) 1 MG tablet Take 1 mg by mouth 2 (two) times daily as needed for anxiety.    [provider]  cyclobenzaprine (FLEXERIL) 10 MG tablet Take 10 mg by mouth 3 (three) times daily as needed for muscle spasms.    [provider]  docusate sodium 100 MG CAPS Take 100 mg by mouth 2 (two) times daily. 04/13/14   Orson Eva, MD  Fluticasone-Salmeterol (ADVAIR) 500-50 MCG/DOSE AEPB Inhale 1 puff into the lungs 2 (two) times daily.    [provider]  Ibuprofen (ADVIL PO) Take by mouth.    [provider]  polyethylene glycol (MIRALAX / GLYCOLAX) packet Take 17 g by mouth daily.    [provider]  predniSONE (DELTASONE) 20 MG tablet  07/28/16   [provider]  senna (SENOKOT) 8.6 MG TABS tablet Take 2 tablets (17.2 mg total) by mouth daily. Patient not taking: Reported on 07/19/2016 04/13/14   Orson Eva,  MD  traMADol (ULTRAM) 50 MG tablet Take 50 mg by mouth every 6 (six) hours as needed for moderate pain.    [provider]    Family History Family History  Problem Relation Age of Onset  . Diabetes Father   . Colon polyps Father   . Heart disease Father   . Stroke Father   . Heart failure Father   . Diabetes Brother   . Muscular dystrophy Mother   . Diabetes Paternal Uncle   . Colon cancer Paternal Grandfather   . Diabetes Son     Social History Social History   Tobacco Use  . Smoking status: Current Every Day Smoker    Packs/day: 1.50    Years: 43.00    Pack years: 64.50    Types: Cigarettes  . Smokeless tobacco: Never Used  .  Tobacco comment: tried chantix couldn't take it  Substance Use Topics  . Alcohol use: Yes    Alcohol/week: 6.0 standard drinks    Types: 6 Cans of beer per week    Comment: 2 times a week  . Drug use: No     Allergies   Erythromycin base   Review of Systems Review of Systems  All other systems reviewed and are negative.    Physical Exam Updated Vital Signs BP 120/77 (BP Location: Right Arm)   Pulse 66   Temp 98 F (36.7 C) (Oral)   Resp 16   Ht 6\' 1"  (1.854 m)   Wt 54.4 kg   SpO2 98%   BMI 15.83 kg/m   Physical Exam Vitals signs and nursing note reviewed.  Constitutional:      Appearance: He is well-developed.  HENT:     Head: Normocephalic and atraumatic.  Eyes:     Pupils: Pupils are equal, round, and reactive to light.  Neck:     Musculoskeletal: Normal range of motion.  Cardiovascular:     Rate and Rhythm: Normal rate and regular rhythm.     Heart sounds: Normal heart sounds.  Pulmonary:     Effort: Pulmonary effort is normal. No respiratory distress.     Breath sounds: Normal breath sounds.  Abdominal:     General: There is no distension.     Palpations: Abdomen is soft.     Tenderness: There is no abdominal tenderness.  Musculoskeletal: Normal range of motion.  Skin:    General: Skin is warm and dry.     Findings: Rash:   Neurological:     Mental Status: He is alert and oriented to person, place, and time.     Comments: 5/5 strength in major muscle groups of  bilateral upper and lower extremities. Speech normal. No facial asymetry.   Psychiatric:        Judgment: Judgment normal.      ED Treatments / Results  Labs (all labs ordered are listed, but only abnormal results are displayed) Labs Reviewed  CBC - Abnormal; Notable for the following components:      Result Value   RBC 4.18 (*)    All other components within normal limits  COMPREHENSIVE METABOLIC PANEL - Abnormal; Notable for the following components:   Potassium 3.4 (*)     Glucose, Bld 123 (*)    Albumin 3.1 (*)    All other components within normal limits    EKG EKG Interpretation  Date/Time:  Wednesday July 01 2018 12:56:24 EDT Ventricular Rate:  82 PR Interval:    QRS Duration: 93 QT Interval:  387 QTC Calculation: 452 R Axis:   88 Text Interpretation:  Sinus rhythm Right atrial enlargement Borderline right axis deviation No significant change since last tracing Confirmed by Merrily Pew [phone]) on 07/01/2018 3:16:08 PM   Radiology No results found.  Procedures Procedures (including critical care time)  Medications Ordered in ED Medications - No data to display   Initial Impression / Assessment and Plan / ED Course  I have reviewed the triage vital signs and the nursing notes.  Pertinent labs & imaging results that were available during my care of the patient were reviewed by me and considered in my medical decision making (see chart for details).        The patient is overall well-appearing.  MRI demonstrates no obvious acute intracranial abnormality.  No indication for additional work-up at this time.  He will continue following up with his primary care physician.  Patient understands return to the ER for new or worsening symptoms.  Final Clinical Impressions(s) / ED Diagnoses   Final diagnoses:  Ataxia    ED Discharge Orders    None       Jola Schmidt, MD 07/03/18 2254

## 2018-09-24 DIAGNOSIS — E782 Mixed hyperlipidemia: Secondary | ICD-10-CM | POA: Diagnosis not present

## 2018-09-24 DIAGNOSIS — F5101 Primary insomnia: Secondary | ICD-10-CM | POA: Diagnosis not present

## 2018-09-24 DIAGNOSIS — E039 Hypothyroidism, unspecified: Secondary | ICD-10-CM | POA: Diagnosis not present

## 2018-09-24 DIAGNOSIS — J439 Emphysema, unspecified: Secondary | ICD-10-CM | POA: Diagnosis not present

## 2018-09-24 DIAGNOSIS — F3341 Major depressive disorder, recurrent, in partial remission: Secondary | ICD-10-CM | POA: Diagnosis not present

## 2018-09-24 DIAGNOSIS — E440 Moderate protein-calorie malnutrition: Secondary | ICD-10-CM | POA: Diagnosis not present

## 2018-09-24 DIAGNOSIS — I700 Atherosclerosis of aorta: Secondary | ICD-10-CM | POA: Diagnosis not present

## 2018-09-24 DIAGNOSIS — Z0000 Encounter for general adult medical examination without abnormal findings: Secondary | ICD-10-CM | POA: Diagnosis not present

## 2018-09-24 DIAGNOSIS — F17200 Nicotine dependence, unspecified, uncomplicated: Secondary | ICD-10-CM | POA: Diagnosis not present

## 2018-09-24 DIAGNOSIS — R7303 Prediabetes: Secondary | ICD-10-CM | POA: Diagnosis not present

## 2018-09-24 DIAGNOSIS — Z125 Encounter for screening for malignant neoplasm of prostate: Secondary | ICD-10-CM | POA: Diagnosis not present

## 2018-09-24 DIAGNOSIS — Z79899 Other long term (current) drug therapy: Secondary | ICD-10-CM | POA: Diagnosis not present

## 2018-11-09 DIAGNOSIS — R05 Cough: Secondary | ICD-10-CM | POA: Diagnosis not present

## 2018-11-11 ENCOUNTER — Ambulatory Visit
Admission: RE | Admit: 2018-11-11 | Discharge: 2018-11-11 | Disposition: A | Discharge status: Home / Self Care | Payer: Medicare Other | Source: Ambulatory Visit | Attending: Family Medicine | Admitting: Family Medicine

## 2018-11-11 ENCOUNTER — Other Ambulatory Visit: Payer: Self-pay

## 2018-11-11 ENCOUNTER — Other Ambulatory Visit: Payer: Self-pay | Admitting: Family Medicine

## 2018-11-11 DIAGNOSIS — R059 Cough, unspecified: Secondary | ICD-10-CM

## 2018-11-11 DIAGNOSIS — R0602 Shortness of breath: Secondary | ICD-10-CM | POA: Diagnosis not present

## 2018-11-11 DIAGNOSIS — R05 Cough: Secondary | ICD-10-CM

## 2019-02-16 DIAGNOSIS — R05 Cough: Secondary | ICD-10-CM | POA: Diagnosis not present

## 2019-02-16 DIAGNOSIS — R5383 Other fatigue: Secondary | ICD-10-CM | POA: Diagnosis not present

## 2019-02-25 ENCOUNTER — Other Ambulatory Visit: Payer: Self-pay

## 2019-02-25 ENCOUNTER — Encounter (HOSPITAL_COMMUNITY): Payer: Self-pay

## 2019-02-25 ENCOUNTER — Emergency Department (HOSPITAL_COMMUNITY): Payer: Medicare Other

## 2019-02-25 ENCOUNTER — Emergency Department (HOSPITAL_COMMUNITY)
Admission: EM | Admit: 2019-02-25 | Discharge: 2019-02-25 | Disposition: A | Discharge status: Home / Self Care | Payer: Medicare Other | Attending: Emergency Medicine | Admitting: Emergency Medicine

## 2019-02-25 DIAGNOSIS — E860 Dehydration: Secondary | ICD-10-CM | POA: Insufficient documentation

## 2019-02-25 DIAGNOSIS — Z79899 Other long term (current) drug therapy: Secondary | ICD-10-CM | POA: Diagnosis not present

## 2019-02-25 DIAGNOSIS — J449 Chronic obstructive pulmonary disease, unspecified: Secondary | ICD-10-CM | POA: Diagnosis not present

## 2019-02-25 DIAGNOSIS — U071 COVID-19: Secondary | ICD-10-CM | POA: Diagnosis not present

## 2019-02-25 DIAGNOSIS — F17210 Nicotine dependence, cigarettes, uncomplicated: Secondary | ICD-10-CM | POA: Diagnosis not present

## 2019-02-25 DIAGNOSIS — R05 Cough: Secondary | ICD-10-CM | POA: Diagnosis not present

## 2019-02-25 DIAGNOSIS — R0602 Shortness of breath: Secondary | ICD-10-CM | POA: Diagnosis not present

## 2019-02-25 LAB — COMPREHENSIVE METABOLIC PANEL
ALT: 123 U/L — ABNORMAL HIGH (ref 0–44)
AST: 124 U/L — ABNORMAL HIGH (ref 15–41)
Albumin: 3.4 g/dL — ABNORMAL LOW (ref 3.5–5.0)
Alkaline Phosphatase: 77 U/L (ref 38–126)
Anion gap: 11 (ref 5–15)
BUN: 26 mg/dL — ABNORMAL HIGH (ref 8–23)
CO2: 25 mmol/L (ref 22–32)
Calcium: 8.8 mg/dL — ABNORMAL LOW (ref 8.9–10.3)
Chloride: 101 mmol/L (ref 98–111)
Creatinine, Ser: 0.79 mg/dL (ref 0.61–1.24)
GFR calc Af Amer: >60 mL/min (ref >60)
GFR calc non Af Amer: >60 mL/min (ref >60)
Glucose, Bld: 85 mg/dL (ref 70–99)
Potassium: 4 mmol/L (ref 3.5–5.1)
Sodium: 137 mmol/L (ref 135–145)
Total Bilirubin: 0.8 mg/dL (ref 0.3–1.2)
Total Protein: 6.6 g/dL (ref 6.5–8.1)

## 2019-02-25 LAB — TROPONIN I (HIGH SENSITIVITY): Troponin I (High Sensitivity): 2 ng/L (ref <18)

## 2019-02-25 LAB — CBC WITH DIFFERENTIAL/PLATELET
Abs Immature Granulocytes: 0.02 10*3/uL (ref 0.00–0.07)
Basophils Absolute: 0 10*3/uL (ref 0.0–0.1)
Basophils Relative: 1 %
Eosinophils Absolute: 0 10*3/uL (ref 0.0–0.5)
Eosinophils Relative: 1 %
HCT: 44.2 % (ref 39.0–52.0)
Hemoglobin: 14.7 g/dL (ref 13.0–17.0)
Immature Granulocytes: 0 %
Lymphocytes Relative: 34 %
Lymphs Abs: 2.1 10*3/uL (ref 0.7–4.0)
MCH: 33.1 pg (ref 26.0–34.0)
MCHC: 33.3 g/dL (ref 30.0–36.0)
MCV: 99.5 fL (ref 80.0–100.0)
Monocytes Absolute: 0.7 10*3/uL (ref 0.1–1.0)
Monocytes Relative: 12 %
Neutro Abs: 3.2 10*3/uL (ref 1.7–7.7)
Neutrophils Relative %: 52 %
Platelets: 220 10*3/uL (ref 150–400)
RBC: 4.44 MIL/uL (ref 4.22–5.81)
RDW: 13.2 % (ref 11.5–15.5)
WBC: 6.1 10*3/uL (ref 4.0–10.5)
nRBC: 0 % (ref 0.0–0.2)

## 2019-02-25 LAB — D-DIMER, QUANTITATIVE: D-Dimer, Quant: 2.69 ug/mL-FEU — ABNORMAL HIGH (ref 0.00–0.50)

## 2019-02-25 MED ORDER — SODIUM CHLORIDE 0.9 % IV BOLUS
1000.0000 mL | Freq: Once | INTRAVENOUS | Status: AC
  Administered 2019-02-25: 1000 mL via INTRAVENOUS

## 2019-02-25 MED ORDER — IOHEXOL 350 MG/ML SOLN
100.0000 mL | Freq: Once | INTRAVENOUS | Status: AC | PRN
  Administered 2019-02-25: 19:00:00 100 mL via INTRAVENOUS

## 2019-02-25 MED ORDER — ACETAMINOPHEN 500 MG PO TABS
1000.0000 mg | ORAL_TABLET | Freq: Once | ORAL | Status: AC
  Administered 2019-02-25: 1000 mg via ORAL
  Filled 2019-02-25: qty 2

## 2019-02-25 NOTE — ED Provider Notes (Addendum)
[facility] EMERGENCY DEPARTMENT Provider Note   CSN: [id number] Arrival date & time:        History   Chief Complaint No chief complaint on file.   HPI [name] is a 68 y.o. male.     68 year old male with past medical history below including COPD, anxiety/depression who presents with COVID-19 and chest pain.  Patient reports that for approximately 1 week, he was not feeling well with cold-like symptoms.  He was tested for COVID-19 on 11/11 and it eventually came back positive.  He was treated with a course of antibiotics which finished yesterday.  He reports that he has had ongoing cough productive of yellow phlegm, associated with shortness of breath and central, nonradiating chest pain that is nonexertional and worse when he takes a deep breath in.  He reports loss of taste.  He initially had body aches when symptoms first began but is no longer having the symptoms.  No fevers, vomiting, or diarrhea.  He reports generalized weakness, feeling like he wants to lay in bed all day.  The history is provided by the patient.    Past Medical History:  Diagnosis Date  . Anxiety   . Arthritis   . Cataract   . COPD (chronic obstructive pulmonary disease) (HCC)    inhaler  . Depression   . 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 Sensory disturbance 04/13/2014  . Epigastric pain   . Orthostasis   . COPD exacerbation ([place]) 04/12/2014  . Chest pain 04/12/2014  . Numbness and tingling 04/12/2014  . CHEST PAIN 06/27/2008  . ABNORMAL FINDINGS GI TRACT 06/27/2008  . COLONIC POLYPS, HX OF 06/27/2008  . WEIGHT LOSS 05/23/2008  . EPIGASTRIC PAIN 05/23/2008  . NONSPEC ABN FINDNG RAD & OTH EXAM ABDOMINAL AREA 05/23/2008  . TOBACCO ABUSE 03/30/2007  . EMPHYSEMA 03/30/2007  . ASTHMA 03/30/2007  . CHRONIC OBSTRUCTIVE PULMONARY DISEASE, MILD 03/30/2007  . OSTEOARTHRITIS 03/30/2007  . ARTHRITIS  03/30/2007  . [place] DISEASE, 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    Past Surgical History:  Procedure Laterality Date  . BACK SURGERY    . LUMBAR DISC SURGERY Left 06/2003   L4 on L3 hemilaminectomy, foraminotomy for 3-4,4-5 with left L3-L4 extraforaminal diskectomy/notes 08/22/2010   . REPAIR DURAL / CSF LEAK  07/2003   Archie Endo 08/22/2010        Home Medications    Prior to Admission medications   Medication Sig Start Date End Date Taking? Authorizing Provider  citalopram (CELEXA) 40 MG tablet Take 40 mg by mouth daily.    [provider]  clonazePAM (KLONOPIN) 1 MG tablet Take 1 mg by mouth 2 (two) times daily as needed for anxiety.    [provider]  cyclobenzaprine (FLEXERIL) 10 MG tablet Take 10 mg by mouth 3 (three) times daily as needed for muscle spasms.    [provider]  docusate sodium 100 MG CAPS Take 100 mg by mouth 2 (two) times daily. 04/13/14   Orson Eva, MD  Fluticasone-Salmeterol (ADVAIR) 500-50 MCG/DOSE AEPB Inhale 1 puff into the lungs 2 (two) times daily.    [provider]  Ibuprofen (ADVIL PO) Take by mouth.    [provider]  polyethylene glycol (MIRALAX / GLYCOLAX) packet Take 17 g by mouth daily.    [provider]  predniSONE (DELTASONE) 20 MG tablet  07/28/16   [provider]  senna (SENOKOT) 8.6 MG TABS tablet Take 2 tablets (17.2 mg total) by mouth daily. Patient not taking: Reported on 07/19/2016 04/13/14   Orson Eva, MD  traMADol (ULTRAM) 50 MG tablet Take 50 mg by mouth every 6 (six) hours as needed for moderate pain.    [provider]    Family History Family History  Problem Relation Age of Onset  . Diabetes Father   . Colon polyps Father   . Heart disease Father   . Stroke Father   . Heart failure Father   . Diabetes Brother   . Muscular dystrophy Mother   . Diabetes Paternal Uncle   . Colon cancer Paternal Grandfather   . Diabetes Son     Social History Social  History   Tobacco Use  . Smoking status: Current Every Day Smoker    Packs/day: 1.50    Years: 43.00    Pack years: 64.50    Types: Cigarettes  . Smokeless tobacco: Never Used  . Tobacco comment: tried chantix couldn't take it  Substance Use Topics  . Alcohol use: Yes    Alcohol/week: 6.0 standard drinks    Types: 6 Cans of beer per week    Comment: 2 times a week  . Drug use: No     Allergies   Erythromycin base   Review of Systems Review of Systems All other systems reviewed and are negative except that which was mentioned in HPI   Physical Exam Updated Vital Signs BP 126/79   Pulse 76   Temp 98.9 F (37.2 C) (Oral)   Resp 17   Ht 6\' 1"  (1.854 m)   Wt 60.3 kg   SpO2 99%   BMI 17.55 kg/m   Physical Exam Vitals signs and nursing note reviewed.  Constitutional:      General: He is not in acute distress.    Appearance: He is well-developed.  HENT:     Head: Normocephalic and atraumatic.  Eyes:     Conjunctiva/sclera: Conjunctivae normal.  Neck:     Musculoskeletal: Neck supple.  Cardiovascular:     Rate and Rhythm: Normal rate and regular rhythm.     Heart sounds: Normal heart sounds. No murmur.  Pulmonary:     Effort: Pulmonary effort is normal.     Breath sounds: Normal breath sounds.  Abdominal:     General: Bowel sounds are normal. There is no distension.     Palpations: Abdomen is soft.     Tenderness: There is no abdominal tenderness.  Musculoskeletal:     Right lower leg: No edema.     Left lower leg: No edema.  Skin:    General: Skin is warm and dry.  Neurological:     Mental Status: He is alert and oriented to person, place, and time.     Comments: Fluent speech  Psychiatric:        Judgment: Judgment normal.      ED Treatments / Results  Labs (all labs ordered are listed, but only abnormal results are displayed) Labs Reviewed  COMPREHENSIVE METABOLIC PANEL  CBC WITH DIFFERENTIAL/PLATELET  D-DIMER, QUANTITATIVE (NOT AT [facility])   TROPONIN I (HIGH SENSITIVITY)    EKG None  Radiology No results found.  Procedures Procedures (including critical care time)  Medications Ordered in ED Medications - No data to display   Initial Impression / Assessment and Plan / ED Course  I have reviewed the triage vital signs and the nursing notes.  Pertinent labs & imaging  results that were available during my care of the patient were reviewed by me and considered in my medical decision making (see chart for details).        Pt comfortable on exam w/ normal VS, normal WOB.  It sounds like he is almost 2 weeks into the course of COVID-19 and his vital signs and general appearance are reassuring.  Will obtain screening lab work to evaluate for dehydration as well as PE given the association of COVID-19 with hypercoagulability.  I have also ordered screening chest x-ray.  I am signing out to the oncoming provider pending completion of blood work and imaging.  Lc Oblander Makin was evaluated in Emergency Department on 02/25/2019 for the symptoms described in the history of present illness. He was evaluated in the context of the global COVID-19 pandemic, which necessitated consideration that the patient might be at risk for infection with the SARS-CoV-2 virus that causes COVID-19. Institutional protocols and algorithms that pertain to the evaluation of patients at risk for COVID-19 are in a state of rapid change based on information released by regulatory bodies including the CDC and federal and state organizations. These policies and algorithms were followed during the patient's care in the ED.  Final Clinical Impressions(s) / ED Diagnoses   Final diagnoses:  None    ED Discharge Orders    None       Little, Wenda Overland, MD 02/25/19 1453    Little, Wenda Overland, MD 02/25/19 1453

## 2019-02-25 NOTE — Discharge Instructions (Signed)
Make sure you are pushing yourself to drink plenty of fluids and eat what you can.  Continue to rest and take medication as needed such as Tylenol for headache or fever.

## 2019-02-25 NOTE — ED Provider Notes (Signed)
CT is negative for PE but does show a pulmonary nodule which patient states he has had for years.  Did encourage him to have follow-up as recommended.  Patient's LFTs are mildly elevated today at 124 and 123.  He states he has been taking Goody's powders for his headache but he has not been eating and drinking.  This may be related to dehydration and medications.  Did recommend that patient start pushing fluids and will need to follow-up with his PCP for repeat labs.  He otherwise is well-appearing and feel that he will be safe for discharge.  He was given a liter of fluid prior to discharge.   Blanchie Dessert, MD 02/25/19 [phone]

## 2019-02-25 NOTE — ED Triage Notes (Signed)
Pt was tested on 02/15/19 & positive results were told to him on 02/17/19. He has been afebrile, no taste, SOB, productive cough low sputum. His PCP has given pt ABT for the yellow sputum that he has since finished. He has now developed CP that feels like pressure & presents to the ED, A/Ox4, EKG was WDL, & VSS (per EMS).

## 2019-02-25 NOTE — ED Notes (Signed)
RN Mitzi Hansen updated pt daughter

## 2019-03-15 DIAGNOSIS — R05 Cough: Secondary | ICD-10-CM | POA: Diagnosis not present

## 2019-03-15 DIAGNOSIS — F17200 Nicotine dependence, unspecified, uncomplicated: Secondary | ICD-10-CM | POA: Diagnosis not present

## 2019-03-15 DIAGNOSIS — F3341 Major depressive disorder, recurrent, in partial remission: Secondary | ICD-10-CM | POA: Diagnosis not present

## 2019-03-15 DIAGNOSIS — J439 Emphysema, unspecified: Secondary | ICD-10-CM | POA: Diagnosis not present

## 2019-05-20 DIAGNOSIS — L720 Epidermal cyst: Secondary | ICD-10-CM | POA: Diagnosis not present

## 2019-05-20 DIAGNOSIS — D485 Neoplasm of uncertain behavior of skin: Secondary | ICD-10-CM | POA: Diagnosis not present

## 2019-06-18 DIAGNOSIS — M5136 Other intervertebral disc degeneration, lumbar region: Secondary | ICD-10-CM | POA: Diagnosis not present

## 2019-07-01 DIAGNOSIS — R399 Unspecified symptoms and signs involving the genitourinary system: Secondary | ICD-10-CM | POA: Diagnosis not present

## 2019-07-01 DIAGNOSIS — D692 Other nonthrombocytopenic purpura: Secondary | ICD-10-CM | POA: Diagnosis not present

## 2019-07-01 DIAGNOSIS — R54 Age-related physical debility: Secondary | ICD-10-CM | POA: Diagnosis not present

## 2019-07-01 DIAGNOSIS — E538 Deficiency of other specified B group vitamins: Secondary | ICD-10-CM | POA: Diagnosis not present

## 2019-07-01 DIAGNOSIS — Z5181 Encounter for therapeutic drug level monitoring: Secondary | ICD-10-CM | POA: Diagnosis not present

## 2019-07-01 DIAGNOSIS — R5382 Chronic fatigue, unspecified: Secondary | ICD-10-CM | POA: Diagnosis not present

## 2019-07-08 DIAGNOSIS — E538 Deficiency of other specified B group vitamins: Secondary | ICD-10-CM | POA: Diagnosis not present

## 2019-07-15 DIAGNOSIS — E538 Deficiency of other specified B group vitamins: Secondary | ICD-10-CM | POA: Diagnosis not present

## 2019-07-22 DIAGNOSIS — K2950 Unspecified chronic gastritis without bleeding: Secondary | ICD-10-CM | POA: Diagnosis not present

## 2019-07-22 DIAGNOSIS — E440 Moderate protein-calorie malnutrition: Secondary | ICD-10-CM | POA: Diagnosis not present

## 2019-07-22 DIAGNOSIS — F3341 Major depressive disorder, recurrent, in partial remission: Secondary | ICD-10-CM | POA: Diagnosis not present

## 2019-07-22 DIAGNOSIS — E039 Hypothyroidism, unspecified: Secondary | ICD-10-CM | POA: Diagnosis not present

## 2019-07-22 DIAGNOSIS — F17200 Nicotine dependence, unspecified, uncomplicated: Secondary | ICD-10-CM | POA: Diagnosis not present

## 2019-07-22 DIAGNOSIS — E538 Deficiency of other specified B group vitamins: Secondary | ICD-10-CM | POA: Diagnosis not present

## 2019-08-26 DIAGNOSIS — E039 Hypothyroidism, unspecified: Secondary | ICD-10-CM | POA: Diagnosis not present

## 2019-08-26 DIAGNOSIS — G4700 Insomnia, unspecified: Secondary | ICD-10-CM | POA: Diagnosis not present

## 2019-08-26 DIAGNOSIS — F17200 Nicotine dependence, unspecified, uncomplicated: Secondary | ICD-10-CM | POA: Diagnosis not present

## 2019-08-26 DIAGNOSIS — J439 Emphysema, unspecified: Secondary | ICD-10-CM | POA: Diagnosis not present

## 2019-08-26 DIAGNOSIS — E538 Deficiency of other specified B group vitamins: Secondary | ICD-10-CM | POA: Diagnosis not present

## 2019-08-26 DIAGNOSIS — F3341 Major depressive disorder, recurrent, in partial remission: Secondary | ICD-10-CM | POA: Diagnosis not present

## 2019-08-26 DIAGNOSIS — I700 Atherosclerosis of aorta: Secondary | ICD-10-CM | POA: Diagnosis not present

## 2019-09-02 NOTE — Progress Notes (Signed)
NEUROLOGY CONSULTATION NOTE  [name] MRN: [id number] DOB: 1951-03-27  Referring provider: Maurice Small, MD Primary care provider: Maurice Small, MD  Reason for consult:  Balance disorder, memory problems  HISTORY OF PRESENT ILLNESS: [name]. [name] is a 69 year old right-handed male with COPD and status post lumbar laminectomy and diskectomy in 2012 who presents for balance disorder and memory problems.  History supplemented by referring provider's note.  For two years, he reports progressively worsening gait.  When he is standing and walking, he needs to hold onto the wall or furniture at times.  He feels his body pulling usually to the left.  He walks slower and occasionally notes that he drags his foot.  He has fallen four times in the past year.  He denies numbness in the feet.  He denies pain in the legs although he has chronic low back pain since lumbar surgery in 2005.  He sometimes feels dizzy.  He feels weak in the legs.  He apparently needs a right knee replacement but the orthopedist does not want to perform surgery until it is determined what is wrong with him. He has had an unintentional 50 lb weight loss over the past year.  Denies double vision.  He notes that his hands sometimes cramp but denies difficulty opening his hands when clenched in a fist.    He also reports worsening memory loss over the past year.  He will misplace keys and wallet, leaving them in different rooms.  When driving to a familiar place, he may briefly need to think twice about how to get there but has not actually gotten lost on familiar routes.  He forgets appointments.  He has forgotten the date of his wedding anniversary.  For 2 years.  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  Have to hold onto the door jam.  Pulls to left more than right.  Speed slowed down.  Foot occasionally drags.  Needs right knee replacement but no surgery until find out what's going on.  Sometimes dizziness.  [place] [address]  across a display.  Weak in legs lost 50 lbs in last 2 years.  No double vision no numbness.  Chronic back pain surgey 2005 He was evaluated in the ED in March 2020 for unsteady gait.  MRI of brain at that time, personally reviewed, showed atrophy and chronic small vessel ischemic changes but no acute intracranial abnormality.  MRA of head was negative.    Labs in March demonstrated an elevated TSH of 7.94.  He was started on levothyroxine.  B12 level was low at 188 and was started on shots.    He has history of excessive alcohol use and would drink up to 10 beers a day.  He stopped 2 years ago.  He is a smoker.  He has history of bilateral cataract, s/p surgery.  No history of cardiac conduction disorders.  He reports maternal family history of muscular dystrophy.  He says that his mother and maternal first cousins all passed away, some at a young age, from muscular dystrophy.   07/01/2019 LABS:  CBC with WBC 6.2, HGB 12.2, HCT 35.2, PLT 241, MCV 98; CMP with Na 135, K 4.9, Cl 100, glucose 99, BUN 16, Cr 0.85, t bili 0.2, ALP 66, AST 19, ALT 14.   PAST MEDICAL HISTORY: Past Medical History:  Diagnosis Date  . Anxiety   . Arthritis   . Cataract   . COPD (chronic obstructive pulmonary disease) ([place])  inhaler  . Depression   . Neuromuscular disorder ([place])    bulding disc    PAST SURGICAL HISTORY: Past Surgical History:  Procedure Laterality Date  . BACK SURGERY    . LUMBAR DISC SURGERY Left 06/2003   L4 on L3 hemilaminectomy, foraminotomy for 3-4,4-5 with left L3-L4 extraforaminal diskectomy/notes 08/22/2010   . REPAIR DURAL / CSF LEAK  07/2003   Archie Endo 08/22/2010    MEDICATIONS: Current Outpatient Medications on File Prior to Visit  Medication Sig Dispense Refill  . citalopram (CELEXA) 40 MG tablet Take 40 mg by mouth daily.    . clonazePAM (KLONOPIN) 1 MG tablet Take 1 mg by mouth 2 (two) times daily as needed for anxiety.    . cyclobenzaprine (FLEXERIL) 10 MG tablet Take 10 mg by  mouth 3 (three) times daily as needed for muscle spasms.    Marland Kitchen docusate sodium 100 MG CAPS Take 100 mg by mouth 2 (two) times daily. 60 capsule 0  . Fluticasone-Salmeterol (ADVAIR) 500-50 MCG/DOSE AEPB Inhale 1 puff into the lungs 2 (two) times daily.    . Ibuprofen (ADVIL PO) Take by mouth.    . polyethylene glycol (MIRALAX / GLYCOLAX) packet Take 17 g by mouth daily.    . predniSONE (DELTASONE) 20 MG tablet     . senna (SENOKOT) 8.6 MG TABS tablet Take 2 tablets (17.2 mg total) by mouth daily. (Patient not taking: Reported on 07/19/2016) 60 each 0  . traMADol (ULTRAM) 50 MG tablet Take 50 mg by mouth every 6 (six) hours as needed for moderate pain.     Current Facility-Administered Medications on File Prior to Visit  Medication Dose Route Frequency Provider Last Rate Last Admin  . 0.9 %  sodium chloride infusion  500 mL Intravenous Continuous Ladene Artist, MD        ALLERGIES: Allergies  Allergen Reactions  . Erythromycin Base Other (See Comments)    hallucinations    FAMILY HISTORY: Family History  Problem Relation Age of Onset  . Diabetes Father   . Colon polyps Father   . Heart disease Father   . Stroke Father   . Heart failure Father   . Diabetes Brother   . Muscular dystrophy Mother   . Diabetes Paternal Uncle   . Colon cancer Paternal Grandfather   . Diabetes Son     SOCIAL HISTORY: Social History   Socioeconomic History  . Marital status: Married    Spouse name: Not on file  . Number of children: Not on file  . Years of education: Not on file  . Highest education level: Not on file  Occupational History  . Not on file  Tobacco Use  . Smoking status: Current Every Day Smoker    Packs/day: 1.50    Years: 43.00    Pack years: 64.50    Types: Cigarettes  . Smokeless tobacco: Never Used  . Tobacco comment: tried chantix couldn't take it  Substance and Sexual Activity  . Alcohol use: Yes    Alcohol/week: 6.0 standard drinks    Types: 6 Cans of beer per  week    Comment: 2 times a week  . Drug use: No  . Sexual activity: Not Currently  Other Topics Concern  . Not on file  Social History Narrative  . Not on file   Social Determinants of Health   Financial Resource Strain:   . Difficulty of Paying Living Expenses:   Food Insecurity:   . Worried About [employer] of  Food in the Last Year:   . [place] in the Last Year:   Transportation Needs:   . Film/video editor (Medical):   Marland Kitchen Lack of Transportation (Non-Medical):   Physical Activity:   . Days of Exercise per Week:   . Minutes of Exercise per Session:   Stress:   . Feeling of Stress :   Social Connections:   . Frequency of Communication with Friends and Family:   . Frequency of Social Gatherings with Friends and Family:   . Attends Religious Services:   . Active Member of Clubs or Organizations:   . Attends Archivist Meetings:   Marland Kitchen Marital Status:   Intimate Partner Violence:   . Fear of Current or Ex-Partner:   . Emotionally Abused:   Marland Kitchen Physically Abused:   . Sexually Abused:     PHYSICAL EXAM: Blood pressure 111/66, pulse 69, height 6\' 1"  (1.854 m), weight 115 lb 3.2 oz (52.3 kg), SpO2 98 %. General: No acute distress.  Patient appears well-groomed.  Gaunt.  Head:  High forehead with temporal wasting.   Eyes:  fundi examined but not visualized Neck: supple, no paraspinal tenderness, full range of motion Back: No paraspinal tenderness Heart: regular rate and rhythm Lungs: Clear to auscultation bilaterally. Vascular: No carotid bruits. Neurological Exam: Mental status: alert and oriented to person, place, and time.  Language intact. [place] Exam 09/03/2019  Weekday Correct 1  Current year 1  What state are we in? 1  Amount spent 0  Amount left 0  # of Animals 2  5 objects recall 4  Number series 2  Hour markers 2  Time correct 0  Placed X in triangle correctly 1  Largest Figure 1  Name of male 0  Date back to  work 2  Type of work [employer] she lived in 0  Total score 17   CN I: not tested CN II: pupils equal, round and reactive to light, visual fields intact CN III, IV, VI:  full range of motion, no nystagmus, no ptosis CN V: facial sensation intact CN VII: upper and lower face symmetric CN VIII: hearing intact CN IX, X: gag intact, uvula midline CN XI: sternocleidomastoid and trapezius muscles intact CN XII: tongue midline Bulk & Tone: normal, no fasciculations.  No myotonia noted. Motor:  5-/5 left EHL muscle.  Otherwise, 5/5 throughout Sensation:  Pinprick and vibration sensation reduced in feet. Deep Tendon Reflexes:  2+ upper extremities, 1+ lower extremities, toes downgoing. Finger to nose testing:  Without dysmetria.  Heel to shin:  Without dysmetria.  Gait:  Mildly wide-based unsteady gait..  Able to turn; unable to tandem walk. Romberg with sway.  IMPRESSION: 1.  Unsteady gait. 2.  Memory deficits 3.  Polyneuropathy 4.  Unintentional weight loss 5.  Tobacco abuse 6.  History of excessive alcohol use. 7.  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 He exhibits some of the facial features as well as temporal wasting, which is often seen with this disease.  He also exhibits peripheral neuropathy in the lower extremities which may be contributing to balance problems and may be seen in myotonic dystrophy.  He also has history of bilateral cataracts, which is often seen with myotonic dystrophy.  He didn't demonstrate any myotonia on exam but when I asked him about family history, he said that several family members on his mother's side, including his mother,  had muscular dystrophy.  Cognitive impairment may be seen with this as well.  However, his reported onset of symptoms is atypically late for myotonic dystrophy.  Given the fairly recent and progressive onset of symptoms, and reported history of unintentional weight loss and  current tobacco use, I would like to repeat MRI of brain WITH and without contrast to evaluate for any significant changes since last year and potential metastatic disease.   He was recently diagnosed with B12 deficiency and hypothyroidism.  Even though he has started treatment, it may still be too early to determine if this is related to his symptoms (which is still a possible cause).  PLAN: 1.  MRI of brain with and without contrast 2.  Check B1, CK, aldolase 3.  Neuropsychological testing 4.  NCV-EMG (not urgent and patient okay to wait for when Dr. Posey Pronto returns in August). 5.  Follow up after testing. 6.  Given his unintentional weight loss and tobacco use, Dr. Justin Mend may want to consider a malignancy workup if not already performed.  I leave that to her discretion whether it would be appropriate.    Thank you for allowing me to take part in the care of this patient.  Metta Clines, DO  CC: Maurice Small, MD

## 2019-09-03 ENCOUNTER — Ambulatory Visit (INDEPENDENT_AMBULATORY_CARE_PROVIDER_SITE_OTHER): Payer: Medicare Other | Admitting: Neurology

## 2019-09-03 ENCOUNTER — Encounter: Payer: Self-pay | Admitting: Neurology

## 2019-09-03 ENCOUNTER — Other Ambulatory Visit: Payer: Self-pay

## 2019-09-03 ENCOUNTER — Other Ambulatory Visit: Payer: Medicare Other

## 2019-09-03 VITALS — BP 111/66 | HR 69 | Ht 73.0 in | Wt 115.2 lb

## 2019-09-03 DIAGNOSIS — Z820 Family history of epilepsy and other diseases of the nervous system: Secondary | ICD-10-CM | POA: Diagnosis not present

## 2019-09-03 DIAGNOSIS — G6289 Other specified polyneuropathies: Secondary | ICD-10-CM | POA: Diagnosis not present

## 2019-09-03 DIAGNOSIS — R413 Other amnesia: Secondary | ICD-10-CM | POA: Diagnosis not present

## 2019-09-03 DIAGNOSIS — R2681 Unsteadiness on feet: Secondary | ICD-10-CM | POA: Diagnosis not present

## 2019-09-03 DIAGNOSIS — F17200 Nicotine dependence, unspecified, uncomplicated: Secondary | ICD-10-CM | POA: Diagnosis not present

## 2019-09-03 DIAGNOSIS — R634 Abnormal weight loss: Secondary | ICD-10-CM | POA: Diagnosis not present

## 2019-09-03 LAB — CK: Total CK: 93 U/L (ref 7–232)

## 2019-09-03 NOTE — Patient Instructions (Addendum)
1.  We will check MRI of brain with and without contrast. We have sent a referral to [facility] for your MRI and they will call you directly to schedule your appointment. They are located at [place]. If you need to contact them directly please call [phone].  2.  We will check vitamin B1, CK and aldolase. Your provider has requested that you have labwork completed today. Please go to [facility] ([id number]) on the second floor of this building before leaving the office today. You do not need to check in. If you are not called within 15 minutes please check with the front desk.  3.  We will order neuropsychological testing 4.  Wd will order NCV-EMG 5.  Follow up after testing.  Further recommendations pending results.

## 2019-09-07 ENCOUNTER — Other Ambulatory Visit: Payer: Self-pay | Admitting: Neurology

## 2019-09-07 DIAGNOSIS — F17200 Nicotine dependence, unspecified, uncomplicated: Secondary | ICD-10-CM

## 2019-09-07 DIAGNOSIS — G6289 Other specified polyneuropathies: Secondary | ICD-10-CM

## 2019-09-07 DIAGNOSIS — R413 Other amnesia: Secondary | ICD-10-CM

## 2019-09-07 DIAGNOSIS — Z820 Family history of epilepsy and other diseases of the nervous system: Secondary | ICD-10-CM

## 2019-09-07 DIAGNOSIS — R634 Abnormal weight loss: Secondary | ICD-10-CM

## 2019-09-07 DIAGNOSIS — R2681 Unsteadiness on feet: Secondary | ICD-10-CM

## 2019-09-07 LAB — ALDOLASE: Aldolase: 3.3 U/L (ref <=8.1)

## 2019-09-08 ENCOUNTER — Other Ambulatory Visit: Payer: Self-pay

## 2019-09-08 ENCOUNTER — Ambulatory Visit
Admission: RE | Admit: 2019-09-08 | Discharge: 2019-09-08 | Disposition: A | Discharge status: Home / Self Care | Payer: Medicare Other | Source: Ambulatory Visit | Attending: Neurology | Admitting: Neurology

## 2019-09-08 DIAGNOSIS — Z820 Family history of epilepsy and other diseases of the nervous system: Secondary | ICD-10-CM

## 2019-09-08 DIAGNOSIS — R413 Other amnesia: Secondary | ICD-10-CM

## 2019-09-08 DIAGNOSIS — R634 Abnormal weight loss: Secondary | ICD-10-CM

## 2019-09-08 DIAGNOSIS — G6289 Other specified polyneuropathies: Secondary | ICD-10-CM

## 2019-09-08 DIAGNOSIS — R2681 Unsteadiness on feet: Secondary | ICD-10-CM

## 2019-09-08 DIAGNOSIS — F17200 Nicotine dependence, unspecified, uncomplicated: Secondary | ICD-10-CM

## 2019-09-09 LAB — VITAMIN B1: Vitamin B1 (Thiamine): 11 nmol/L (ref 8–30)

## 2019-09-11 ENCOUNTER — Other Ambulatory Visit: Payer: Self-pay

## 2019-09-11 ENCOUNTER — Ambulatory Visit
Admission: RE | Admit: 2019-09-11 | Discharge: 2019-09-11 | Disposition: A | Discharge status: Home / Self Care | Payer: Medicare Other | Source: Ambulatory Visit | Attending: Neurology | Admitting: Neurology

## 2019-09-11 DIAGNOSIS — Z820 Family history of epilepsy and other diseases of the nervous system: Secondary | ICD-10-CM

## 2019-09-11 DIAGNOSIS — R413 Other amnesia: Secondary | ICD-10-CM

## 2019-09-11 DIAGNOSIS — G6289 Other specified polyneuropathies: Secondary | ICD-10-CM

## 2019-09-11 DIAGNOSIS — F17200 Nicotine dependence, unspecified, uncomplicated: Secondary | ICD-10-CM

## 2019-09-11 DIAGNOSIS — R634 Abnormal weight loss: Secondary | ICD-10-CM

## 2019-09-11 DIAGNOSIS — R2681 Unsteadiness on feet: Secondary | ICD-10-CM

## 2019-09-11 MED ORDER — GADOBENATE DIMEGLUMINE 529 MG/ML IV SOLN
11.0000 mL | Freq: Once | INTRAVENOUS | Status: AC | PRN
  Administered 2019-09-11: 11 mL via INTRAVENOUS

## 2019-09-17 DIAGNOSIS — S46201A Unspecified injury of muscle, fascia and tendon of other parts of biceps, right arm, initial encounter: Secondary | ICD-10-CM | POA: Diagnosis not present

## 2019-09-19 DIAGNOSIS — M25512 Pain in left shoulder: Secondary | ICD-10-CM | POA: Diagnosis not present

## 2019-09-19 DIAGNOSIS — S46912A Strain of unspecified muscle, fascia and tendon at shoulder and upper arm level, left arm, initial encounter: Secondary | ICD-10-CM | POA: Diagnosis not present

## 2019-09-20 ENCOUNTER — Telehealth: Payer: Self-pay | Admitting: Neurology

## 2019-09-20 NOTE — Telephone Encounter (Signed)
Patient called in wanting to find out results of his MRI and labwork.

## 2019-09-20 NOTE — Telephone Encounter (Signed)
Patient advised of labs and MRI.

## 2019-09-21 DIAGNOSIS — M25512 Pain in left shoulder: Secondary | ICD-10-CM | POA: Diagnosis not present

## 2019-09-28 ENCOUNTER — Other Ambulatory Visit: Payer: Medicare Other

## 2019-10-08 ENCOUNTER — Encounter: Payer: Medicare Other | Admitting: Psychology

## 2019-10-22 ENCOUNTER — Encounter: Payer: Medicare Other | Admitting: Psychology

## 2019-11-30 ENCOUNTER — Encounter: Payer: Medicare Other | Admitting: Neurology

## 2020-02-03 DIAGNOSIS — M9901 Segmental and somatic dysfunction of cervical region: Secondary | ICD-10-CM | POA: Diagnosis not present

## 2020-02-03 DIAGNOSIS — M519 Unspecified thoracic, thoracolumbar and lumbosacral intervertebral disc disorder: Secondary | ICD-10-CM | POA: Diagnosis not present

## 2020-02-03 DIAGNOSIS — M546 Pain in thoracic spine: Secondary | ICD-10-CM | POA: Diagnosis not present

## 2020-02-03 DIAGNOSIS — M50323 Other cervical disc degeneration at C6-C7 level: Secondary | ICD-10-CM | POA: Diagnosis not present

## 2020-02-03 DIAGNOSIS — M9902 Segmental and somatic dysfunction of thoracic region: Secondary | ICD-10-CM | POA: Diagnosis not present

## 2020-02-11 DIAGNOSIS — M9902 Segmental and somatic dysfunction of thoracic region: Secondary | ICD-10-CM | POA: Diagnosis not present

## 2020-02-11 DIAGNOSIS — M50323 Other cervical disc degeneration at C6-C7 level: Secondary | ICD-10-CM | POA: Diagnosis not present

## 2020-02-11 DIAGNOSIS — M546 Pain in thoracic spine: Secondary | ICD-10-CM | POA: Diagnosis not present

## 2020-02-11 DIAGNOSIS — M9901 Segmental and somatic dysfunction of cervical region: Secondary | ICD-10-CM | POA: Diagnosis not present

## 2020-02-11 DIAGNOSIS — M519 Unspecified thoracic, thoracolumbar and lumbosacral intervertebral disc disorder: Secondary | ICD-10-CM | POA: Diagnosis not present

## 2020-02-18 DIAGNOSIS — M50323 Other cervical disc degeneration at C6-C7 level: Secondary | ICD-10-CM | POA: Diagnosis not present

## 2020-02-18 DIAGNOSIS — M9901 Segmental and somatic dysfunction of cervical region: Secondary | ICD-10-CM | POA: Diagnosis not present

## 2020-02-18 DIAGNOSIS — M546 Pain in thoracic spine: Secondary | ICD-10-CM | POA: Diagnosis not present

## 2020-02-18 DIAGNOSIS — M9902 Segmental and somatic dysfunction of thoracic region: Secondary | ICD-10-CM | POA: Diagnosis not present

## 2020-02-18 DIAGNOSIS — M519 Unspecified thoracic, thoracolumbar and lumbosacral intervertebral disc disorder: Secondary | ICD-10-CM | POA: Diagnosis not present

## 2020-02-21 DIAGNOSIS — M546 Pain in thoracic spine: Secondary | ICD-10-CM | POA: Diagnosis not present

## 2020-02-21 DIAGNOSIS — M9901 Segmental and somatic dysfunction of cervical region: Secondary | ICD-10-CM | POA: Diagnosis not present

## 2020-02-21 DIAGNOSIS — M9902 Segmental and somatic dysfunction of thoracic region: Secondary | ICD-10-CM | POA: Diagnosis not present

## 2020-02-21 DIAGNOSIS — M519 Unspecified thoracic, thoracolumbar and lumbosacral intervertebral disc disorder: Secondary | ICD-10-CM | POA: Diagnosis not present

## 2020-02-21 DIAGNOSIS — M50323 Other cervical disc degeneration at C6-C7 level: Secondary | ICD-10-CM | POA: Diagnosis not present

## 2020-02-23 DIAGNOSIS — M9901 Segmental and somatic dysfunction of cervical region: Secondary | ICD-10-CM | POA: Diagnosis not present

## 2020-02-23 DIAGNOSIS — M9902 Segmental and somatic dysfunction of thoracic region: Secondary | ICD-10-CM | POA: Diagnosis not present

## 2020-02-23 DIAGNOSIS — M50323 Other cervical disc degeneration at C6-C7 level: Secondary | ICD-10-CM | POA: Diagnosis not present

## 2020-02-23 DIAGNOSIS — M519 Unspecified thoracic, thoracolumbar and lumbosacral intervertebral disc disorder: Secondary | ICD-10-CM | POA: Diagnosis not present

## 2020-02-23 DIAGNOSIS — M546 Pain in thoracic spine: Secondary | ICD-10-CM | POA: Diagnosis not present

## 2020-02-25 DIAGNOSIS — M9902 Segmental and somatic dysfunction of thoracic region: Secondary | ICD-10-CM | POA: Diagnosis not present

## 2020-02-25 DIAGNOSIS — M546 Pain in thoracic spine: Secondary | ICD-10-CM | POA: Diagnosis not present

## 2020-02-25 DIAGNOSIS — M9901 Segmental and somatic dysfunction of cervical region: Secondary | ICD-10-CM | POA: Diagnosis not present

## 2020-02-25 DIAGNOSIS — M50323 Other cervical disc degeneration at C6-C7 level: Secondary | ICD-10-CM | POA: Diagnosis not present

## 2020-02-25 DIAGNOSIS — M519 Unspecified thoracic, thoracolumbar and lumbosacral intervertebral disc disorder: Secondary | ICD-10-CM | POA: Diagnosis not present

## 2020-03-14 DIAGNOSIS — M47817 Spondylosis without myelopathy or radiculopathy, lumbosacral region: Secondary | ICD-10-CM | POA: Diagnosis not present

## 2020-03-14 DIAGNOSIS — M5136 Other intervertebral disc degeneration, lumbar region: Secondary | ICD-10-CM | POA: Diagnosis not present

## 2020-03-24 DIAGNOSIS — M25571 Pain in right ankle and joints of right foot: Secondary | ICD-10-CM | POA: Diagnosis not present

## 2020-04-28 ENCOUNTER — Other Ambulatory Visit: Payer: Self-pay

## 2020-04-28 ENCOUNTER — Other Ambulatory Visit: Payer: Self-pay | Admitting: *Deleted

## 2020-04-28 ENCOUNTER — Ambulatory Visit (INDEPENDENT_AMBULATORY_CARE_PROVIDER_SITE_OTHER): Payer: Medicare Other | Admitting: Sports Medicine

## 2020-04-28 ENCOUNTER — Encounter: Payer: Self-pay | Admitting: Sports Medicine

## 2020-04-28 DIAGNOSIS — M778 Other enthesopathies, not elsewhere classified: Secondary | ICD-10-CM

## 2020-04-28 DIAGNOSIS — M7751 Other enthesopathy of right foot: Secondary | ICD-10-CM | POA: Diagnosis not present

## 2020-04-28 DIAGNOSIS — M779 Enthesopathy, unspecified: Secondary | ICD-10-CM

## 2020-04-28 DIAGNOSIS — M792 Neuralgia and neuritis, unspecified: Secondary | ICD-10-CM

## 2020-04-28 DIAGNOSIS — M79671 Pain in right foot: Secondary | ICD-10-CM

## 2020-04-28 MED ORDER — DICLOFENAC SODIUM 75 MG PO TBEC: 75.0000 mg | DELAYED_RELEASE_TABLET | Freq: Two times a day (BID) | ORAL | 0 refills | Status: DC

## 2020-04-28 MED ORDER — DICLOFENAC SODIUM 1 % EX GEL: 4.0000 g | Freq: Four times a day (QID) | CUTANEOUS | 1 refills | Status: DC

## 2020-04-28 NOTE — Progress Notes (Signed)
Subjective: [name] is a 70 y.o. male patient who presents to office for evaluation of right heel pain.  Patient reports that when he steps down or puts any pressure there is pain at the bottom of the heel states that he saw orthopedic they injected him with the ankle and this did not help the heel.  Patient also reports that he was told he supposed be getting an anti-inflammatory but never received a prescription for it.  Patient also reports that his right lower leg seems smaller and there is a lateral cramping that happens especially at night has to move around to help the cramps go away.  Patient also reports a history of issues with his Achilles and had a Baker's cyst to the back of the heel 3 years ago.  Patient also has a history of back issues and reports that he is on tramadol.  Patient denies any new problems or injuries.  Review of system noncontributory.   Patient Active Problem List   Diagnosis Date Noted  . Sensory disturbance 04/13/2014  . Epigastric pain   . Orthostasis   . COPD exacerbation ([place]) 04/12/2014  . Chest pain 04/12/2014  . Numbness and tingling 04/12/2014  . CHEST PAIN 06/27/2008  . ABNORMAL FINDINGS GI TRACT 06/27/2008  . COLONIC POLYPS, HX OF 06/27/2008  . WEIGHT LOSS 05/23/2008  . EPIGASTRIC PAIN 05/23/2008  . NONSPEC ABN FINDNG RAD & OTH EXAM ABDOMINAL AREA 05/23/2008  . TOBACCO ABUSE 03/30/2007  . EMPHYSEMA 03/30/2007  . ASTHMA 03/30/2007  . CHRONIC OBSTRUCTIVE PULMONARY DISEASE, MILD 03/30/2007  . OSTEOARTHRITIS 03/30/2007  . ARTHRITIS 03/30/2007  . 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 citalopram (CELEXA) 40 MG tablet Take 40 mg by mouth daily.    . clonazePAM (KLONOPIN) 1 MG tablet Take 1 mg by mouth 2 (two) times daily as needed for anxiety.    . Fluticasone-Salmeterol (ADVAIR) 500-50 MCG/DOSE AEPB Inhale 1 puff into the lungs 2 (two)  times daily.    . polyethylene glycol (MIRALAX / GLYCOLAX) packet Take 17 g by mouth daily.    . traMADol (ULTRAM) 50 MG tablet Take 50 mg by mouth every 6 (six) hours as needed for moderate pain.     Current Facility-Administered Medications on File Prior to Visit  Medication Dose Route Frequency Provider Last Rate Last Admin  . 0.9 %  sodium chloride infusion  500 mL Intravenous Continuous Ladene Artist, MD        Allergies  Allergen Reactions  . Erythromycin Base Other (See Comments)    hallucinations    Objective:  General: Alert and oriented x3 in no acute distress  Dermatology: No open lesions bilateral lower extremities, no webspace macerations, no ecchymosis bilateral, all nails x 10 are well manicured.  Small abrasions noted to the right posterior heel and anterior shin from previous brace that patient use that rub the area.  Vascular: Dorsalis Pedis and Posterior Tibial pedal pulses palpable, Capillary Fill Time 5 seconds,(-) pedal hair growth bilateral, varicosities bilateral, no edema bilateral lower extremities, Temperature gradient within normal limits.  Neurology: Johney Maine sensation intact via light touch bilateral, Protective sensation intact  with Thornell Mule Monofilament to all pedal sites, Position sense intact, vibratory diminished bilateral, Deep tendon reflexes within normal limits bilateral, No babinski sign present bilateral. (- )Tinels sign bilateral.   Musculoskeletal: Mild tenderness with palpation at heel at central calcaneal  tubercle on the right.  There is significant atrophy of the lower extremities and hammertoe deformity. Assessment and Plan: Problem List Items Addressed This Visit   None   Visit Diagnoses    Right calcaneal bursitis    -  Primary   Tendonitis       Pain of right heel       Neuritis       Relevant Orders   NCV with EMG(electromyography)       -Complete examination performed -Xrays reviewed -Discussed treatment  options -Rx EMG and NCV at the [facility] for neuritis and toe r/o any other causes for atrophy, hammertoes, and right LE pains -Prescribed topical Voltaren and oral diclofenac for pain and inflammation at plantar heel on right  -Continue with good supportive shoes and dispensed heel offloading padding to use on the right as instructed -Recommend gentle stretching rest ice elevation -Advised patient if symptoms are not better may benefit from a plantar fascial injection central heel plantar approach on right next visit -Patient to return to office in 3 weeks or sooner if condition worsens.  Landis Martins, DPM

## 2020-05-10 ENCOUNTER — Telehealth: Payer: Self-pay | Admitting: *Deleted

## 2020-05-10 NOTE — Telephone Encounter (Signed)
[facility] and spoke with Margreta Journey and the representative stated that they made the appointment for July and the patient called back and cancelled. Lattie Haw

## 2020-05-19 ENCOUNTER — Encounter: Payer: Self-pay | Admitting: Sports Medicine

## 2020-05-19 ENCOUNTER — Ambulatory Visit: Payer: Medicare Other | Admitting: Sports Medicine

## 2020-05-19 ENCOUNTER — Other Ambulatory Visit: Payer: Self-pay

## 2020-05-19 DIAGNOSIS — M7751 Other enthesopathy of right foot: Secondary | ICD-10-CM | POA: Diagnosis not present

## 2020-05-19 DIAGNOSIS — G894 Chronic pain syndrome: Secondary | ICD-10-CM | POA: Insufficient documentation

## 2020-05-19 DIAGNOSIS — R5382 Chronic fatigue, unspecified: Secondary | ICD-10-CM | POA: Insufficient documentation

## 2020-05-19 DIAGNOSIS — F17200 Nicotine dependence, unspecified, uncomplicated: Secondary | ICD-10-CM | POA: Insufficient documentation

## 2020-05-19 DIAGNOSIS — M79671 Pain in right foot: Secondary | ICD-10-CM

## 2020-05-19 DIAGNOSIS — E782 Mixed hyperlipidemia: Secondary | ICD-10-CM | POA: Insufficient documentation

## 2020-05-19 DIAGNOSIS — M792 Neuralgia and neuritis, unspecified: Secondary | ICD-10-CM

## 2020-05-19 DIAGNOSIS — E291 Testicular hypofunction: Secondary | ICD-10-CM | POA: Insufficient documentation

## 2020-05-19 DIAGNOSIS — G4700 Insomnia, unspecified: Secondary | ICD-10-CM | POA: Insufficient documentation

## 2020-05-19 DIAGNOSIS — R7303 Prediabetes: Secondary | ICD-10-CM | POA: Insufficient documentation

## 2020-05-19 DIAGNOSIS — I77819 Aortic ectasia, unspecified site: Secondary | ICD-10-CM | POA: Insufficient documentation

## 2020-05-19 DIAGNOSIS — K2940 Chronic atrophic gastritis without bleeding: Secondary | ICD-10-CM | POA: Insufficient documentation

## 2020-05-19 DIAGNOSIS — F419 Anxiety disorder, unspecified: Secondary | ICD-10-CM | POA: Insufficient documentation

## 2020-05-19 DIAGNOSIS — R54 Age-related physical debility: Secondary | ICD-10-CM | POA: Insufficient documentation

## 2020-05-19 DIAGNOSIS — M779 Enthesopathy, unspecified: Secondary | ICD-10-CM

## 2020-05-19 DIAGNOSIS — G9332 Myalgic encephalomyelitis/chronic fatigue syndrome: Secondary | ICD-10-CM | POA: Insufficient documentation

## 2020-05-19 DIAGNOSIS — E039 Hypothyroidism, unspecified: Secondary | ICD-10-CM | POA: Insufficient documentation

## 2020-05-19 DIAGNOSIS — E538 Deficiency of other specified B group vitamins: Secondary | ICD-10-CM | POA: Insufficient documentation

## 2020-05-19 DIAGNOSIS — F3340 Major depressive disorder, recurrent, in remission, unspecified: Secondary | ICD-10-CM | POA: Insufficient documentation

## 2020-05-19 DIAGNOSIS — R269 Unspecified abnormalities of gait and mobility: Secondary | ICD-10-CM | POA: Insufficient documentation

## 2020-05-19 DIAGNOSIS — E440 Moderate protein-calorie malnutrition: Secondary | ICD-10-CM | POA: Insufficient documentation

## 2020-05-19 DIAGNOSIS — E46 Unspecified protein-calorie malnutrition: Secondary | ICD-10-CM | POA: Insufficient documentation

## 2020-05-19 MED ORDER — TRIAMCINOLONE ACETONIDE 10 MG/ML IJ SUSP
10.0000 mg | Freq: Once | INTRAMUSCULAR | Status: AC
  Administered 2020-05-19: 10 mg

## 2020-05-19 NOTE — Progress Notes (Signed)
Subjective: [name] is a 70 y.o. male patient reports that he is still in a lot of pain has some discomfort at the bottom of his right heel but most discomfort at his ankle.  Patient reports the Voltaren gel has helped his heel and the inserts have helped as well but he still has very significant ankle pain with swelling and redness reports that he cannot take the diclofenac because it makes him feel like he wants to vomit and reports that he does not think it is a nerve problem so he is not going to get the nerve study done.  Patient denies any new problems or injuries.   Patient Active Problem List   Diagnosis Date Noted  . Abnormal gait 05/19/2020  . Anxiety 05/19/2020  . 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 Chronic fatigue syndrome 05/19/2020  . Chronic pain syndrome 05/19/2020  . Dilatation of aorta ([place]) 05/19/2020  . Frail elderly 05/19/2020  . Hypothyroidism 05/19/2020  . Insomnia 05/19/2020  . 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 Malnutrition of moderate degree Altamease Oiler: 60% to less than 75% of standard weight) ([place]) 05/19/2020  . Mixed hyperlipidemia 05/19/2020  . Prediabetes 05/19/2020  . Protein-calorie malnutrition, moderate ([place]) 05/19/2020  . Recurrent major depression in remission ([place]) 05/19/2020  . Tobacco dependence 05/19/2020  . Vitamin B12 deficiency 05/19/2020  . Sensory disturbance 04/13/2014  . Epigastric pain   . Orthostasis   . COPD exacerbation ([place]) 04/12/2014  . Chest pain 04/12/2014  . Numbness and tingling 04/12/2014  . CHEST PAIN 06/27/2008  . ABNORMAL FINDINGS GI TRACT 06/27/2008  . COLONIC POLYPS, HX OF 06/27/2008  . WEIGHT LOSS 05/23/2008  . EPIGASTRIC PAIN 05/23/2008  . NONSPEC ABN FINDNG RAD & OTH EXAM ABDOMINAL AREA 05/23/2008  . TOBACCO ABUSE 03/30/2007  . EMPHYSEMA 03/30/2007  . ASTHMA 03/30/2007  . CHRONIC OBSTRUCTIVE PULMONARY DISEASE, MILD 03/30/2007  . OSTEOARTHRITIS 03/30/2007  . ARTHRITIS 03/30/2007  . DEGENERATIVE DISC DISEASE,  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    Current Outpatient Medications on File Prior to Visit  Medication Sig Dispense Refill  . levothyroxine (SYNTHROID) 25 MCG tablet 1 tablet in the morning on an empty stomach    . omeprazole (PRILOSEC) 40 MG capsule 1 capsule 30 minutes before morning meal    . zolpidem (AMBIEN) 5 MG tablet 1-2 tablet at bedtime    . buPROPion (WELLBUTRIN) 100 MG tablet Take 1 tablet by mouth 2 (two) times daily.    . busPIRone (BUSPAR) 5 MG tablet TAKE 1 TO 2 TABLETS BY MOUTH TWICE A DAY    . citalopram (CELEXA) 40 MG tablet Take 40 mg by mouth daily.    . clonazePAM (KLONOPIN) 1 MG tablet Take 1 mg by mouth 2 (two) times daily as needed for anxiety.    . diclofenac (VOLTAREN) 75 MG EC tablet Take 1 tablet (75 mg total) by mouth 2 (two) times daily. 30 tablet 0  . diclofenac Sodium (VOLTAREN) 1 % GEL Apply 4 g topically 4 (four) times daily. 150 g 1  . Fluticasone-Salmeterol (ADVAIR) 500-50 MCG/DOSE AEPB Inhale 1 puff into the lungs 2 (two) times daily.    . meloxicam (MOBIC) 7.5 MG tablet 1 tablet    . polyethylene glycol (MIRALAX / GLYCOLAX) packet Take 17 g by mouth daily.    . traMADol (ULTRAM) 50 MG tablet Take 50 mg by mouth every 6 (six) hours as needed for moderate pain.    . traZODone (DESYREL) 50 MG tablet      Current Facility-Administered  Medications on File Prior to Visit  Medication Dose Route Frequency Provider Last Rate Last Admin  . 0.9 %  sodium chloride infusion  500 mL Intravenous Continuous Ladene Artist, MD        Allergies  Allergen Reactions  . Erythromycin Base Other (See Comments)    hallucinations    Objective:  General: Alert and oriented x3 in no acute distress  Dermatology: No open lesions bilateral lower extremities, no webspace macerations, no ecchymosis bilateral, all nails x 10 are well manicured.  Small abrasions noted to the right posterior heel that has a dry scab that recently came off with no signs of infection.   Vascular:  Dorsalis Pedis and Posterior Tibial pedal pulses palpable, Capillary Fill Time 5 seconds,(-) pedal hair growth bilateral, varicosities bilateral, no edema bilateral lower extremities, Temperature gradient within normal limits.  Neurology: Johney Maine sensation intact via light touch bilateral.   Musculoskeletal: Moderate tenderness to palpation at the medial ankle joint at the medial malleolus and medial ankle gutter, mild tenderness with palpation at heel at central calcaneal tubercle on the right.  There is significant atrophy of the lower extremities and hammertoe deformity.  Assessment and Plan: Problem List Items Addressed This Visit   None   Visit Diagnoses    Capsulitis of ankle, right    -  Primary   Relevant Medications   meloxicam (MOBIC) 7.5 MG tablet   Right calcaneal bursitis       Tendonitis       Pain of right heel       Neuritis       patient denies any nerve symptoms at this time       -Complete examination performed -Discussed treatment options -Patient declines to get nerve studies because he feels as if this is not a new problem I educated patient that the nerve study was to help further evaluate any further underlying causes patient continues to decline nerve study -After oral consent and aseptic prep, injected a mixture containing 1 ml of 2%  plain lidocaine, 1 ml 0.5% plain marcaine, 0.5 ml of kenalog 10 and 0.5 ml of dexamethasone phosphate into right medial ankle gutter at maximal area of pain without complication. Post-injection care discussed with patient.  -Continue with Voltaren topical  -Recommend gentle stretching rest ice elevation -Continue with good supportive shoes daily and over-the-counter insoles -Patient to return call if no better by next week or sooner if condition worsens.  Landis Martins, DPM

## 2020-07-20 ENCOUNTER — Ambulatory Visit (INDEPENDENT_AMBULATORY_CARE_PROVIDER_SITE_OTHER): Payer: Medicare Other | Admitting: Podiatry

## 2020-07-20 ENCOUNTER — Other Ambulatory Visit: Payer: Self-pay

## 2020-07-20 ENCOUNTER — Ambulatory Visit (INDEPENDENT_AMBULATORY_CARE_PROVIDER_SITE_OTHER): Payer: Medicare Other

## 2020-07-20 ENCOUNTER — Other Ambulatory Visit: Payer: Self-pay | Admitting: Podiatry

## 2020-07-20 DIAGNOSIS — M779 Enthesopathy, unspecified: Secondary | ICD-10-CM

## 2020-07-20 DIAGNOSIS — M7751 Other enthesopathy of right foot: Secondary | ICD-10-CM

## 2020-07-20 DIAGNOSIS — M62571 Muscle wasting and atrophy, not elsewhere classified, right ankle and foot: Secondary | ICD-10-CM | POA: Diagnosis not present

## 2020-07-20 DIAGNOSIS — M76821 Posterior tibial tendinitis, right leg: Secondary | ICD-10-CM

## 2020-07-20 MED ORDER — METHYLPREDNISOLONE 4 MG PO TBPK: ORAL_TABLET | ORAL | 0 refills | Status: DC

## 2020-07-20 NOTE — Progress Notes (Signed)
  Subjective:  Patient ID: [name], male    DOB: 1951/03/10,  MRN: [id number]  Chief Complaint  Patient presents with  . capsulitis    F/U Rt ankle pain -pt states," it's not better with the shot. Usually more pain at the end of the day; 8/10." - w/ swelling - worse with more walking Tx: biofreeze    70 y.o. male presents with the above complaint. History confirmed with patient.   Objective:  Physical Exam: warm, good capillary refill, no trophic changes or ulcerative lesions, normal DP and PT pulses and normal sensory exam. Left Foot: normal exam, no swelling, tenderness, instability; ligaments intact, full range of motion of all ankle/foot joints Right Foot: right ankle pain at the PT tendon with local warmth. Hindfoot valgus with WB. Marked leg muscle atrophy c/t left.  Assessment:   1. Capsulitis of ankle, right   2. Posterior tibial tendonitis, right   3. Muscle wasting and atrophy, NEC, right ankle and foot     Plan:  Patient was evaluated and treated and all questions answered.  Right ankle PT tendonitis, pes planus, hindfoot valgus, muscle wasting -Dispensed Tri-Lock ankle brace.  Patient educated on use  -Refer to PT for muscle atrophy right leg -Rx medrol pack.  Return in about 3 weeks (around 08/10/2020) for Tendonitis.

## 2020-07-24 ENCOUNTER — Telehealth: Payer: Self-pay | Admitting: Podiatry

## 2020-07-24 NOTE — Telephone Encounter (Signed)
Pt saw Dr March Rummage Thursday & rx was sent to wrong pharmacy.  Are you able to send to correct pharmacy which is [facility] [address] or do I need to send to on call provider to resend.

## 2020-07-25 ENCOUNTER — Other Ambulatory Visit: Payer: Self-pay

## 2020-07-25 MED ORDER — METHYLPREDNISOLONE 4 MG PO TBPK: ORAL_TABLET | ORAL | 0 refills | Status: DC

## 2020-07-25 NOTE — Telephone Encounter (Signed)
Pharmacy was updated and medication was resent.

## 2020-07-25 NOTE — Telephone Encounter (Signed)
Medication was resent to the correct pharmacy.

## 2020-08-24 ENCOUNTER — Ambulatory Visit: Payer: Medicare Other | Admitting: Podiatry

## 2020-09-07 DIAGNOSIS — M5136 Other intervertebral disc degeneration, lumbar region: Secondary | ICD-10-CM | POA: Diagnosis not present

## 2020-09-08 DIAGNOSIS — M5136 Other intervertebral disc degeneration, lumbar region: Secondary | ICD-10-CM | POA: Diagnosis not present

## 2020-09-08 DIAGNOSIS — M5116 Intervertebral disc disorders with radiculopathy, lumbar region: Secondary | ICD-10-CM | POA: Diagnosis not present

## 2020-09-21 DIAGNOSIS — Z681 Body mass index (BMI) 19 or less, adult: Secondary | ICD-10-CM | POA: Diagnosis not present

## 2020-09-21 DIAGNOSIS — Z7689 Persons encountering health services in other specified circumstances: Secondary | ICD-10-CM | POA: Diagnosis not present

## 2020-09-21 DIAGNOSIS — R634 Abnormal weight loss: Secondary | ICD-10-CM | POA: Diagnosis not present

## 2020-10-05 DIAGNOSIS — R252 Cramp and spasm: Secondary | ICD-10-CM | POA: Diagnosis not present

## 2020-10-05 DIAGNOSIS — J982 Interstitial emphysema: Secondary | ICD-10-CM | POA: Diagnosis not present

## 2020-10-05 DIAGNOSIS — S2249XA Multiple fractures of ribs, unspecified side, initial encounter for closed fracture: Secondary | ICD-10-CM | POA: Diagnosis not present

## 2020-10-05 DIAGNOSIS — Z792 Long term (current) use of antibiotics: Secondary | ICD-10-CM | POA: Diagnosis not present

## 2020-10-05 DIAGNOSIS — Z87891 Personal history of nicotine dependence: Secondary | ICD-10-CM | POA: Diagnosis not present

## 2020-10-05 DIAGNOSIS — J939 Pneumothorax, unspecified: Secondary | ICD-10-CM | POA: Diagnosis not present

## 2020-10-05 DIAGNOSIS — D72829 Elevated white blood cell count, unspecified: Secondary | ICD-10-CM | POA: Diagnosis not present

## 2020-10-05 DIAGNOSIS — J9811 Atelectasis: Secondary | ICD-10-CM | POA: Diagnosis not present

## 2020-10-05 DIAGNOSIS — F17210 Nicotine dependence, cigarettes, uncomplicated: Secondary | ICD-10-CM | POA: Diagnosis not present

## 2020-10-05 DIAGNOSIS — J432 Centrilobular emphysema: Secondary | ICD-10-CM | POA: Diagnosis not present

## 2020-10-05 DIAGNOSIS — Z8616 Personal history of COVID-19: Secondary | ICD-10-CM | POA: Diagnosis not present

## 2020-10-05 DIAGNOSIS — J439 Emphysema, unspecified: Secondary | ICD-10-CM | POA: Diagnosis not present

## 2020-10-05 DIAGNOSIS — S2242XA Multiple fractures of ribs, left side, initial encounter for closed fracture: Secondary | ICD-10-CM | POA: Diagnosis not present

## 2020-10-05 DIAGNOSIS — T797XXA Traumatic subcutaneous emphysema, initial encounter: Secondary | ICD-10-CM | POA: Diagnosis not present

## 2020-10-05 DIAGNOSIS — Z122 Encounter for screening for malignant neoplasm of respiratory organs: Secondary | ICD-10-CM | POA: Diagnosis not present

## 2020-10-05 DIAGNOSIS — Z681 Body mass index (BMI) 19 or less, adult: Secondary | ICD-10-CM | POA: Diagnosis not present

## 2020-10-05 DIAGNOSIS — M47812 Spondylosis without myelopathy or radiculopathy, cervical region: Secondary | ICD-10-CM | POA: Diagnosis not present

## 2020-10-05 DIAGNOSIS — Z1152 Encounter for screening for COVID-19: Secondary | ICD-10-CM | POA: Diagnosis not present

## 2020-10-05 DIAGNOSIS — F32A Depression, unspecified: Secondary | ICD-10-CM | POA: Diagnosis not present

## 2020-10-05 DIAGNOSIS — E440 Moderate protein-calorie malnutrition: Secondary | ICD-10-CM | POA: Diagnosis not present

## 2020-10-05 DIAGNOSIS — I700 Atherosclerosis of aorta: Secondary | ICD-10-CM | POA: Diagnosis not present

## 2020-10-05 DIAGNOSIS — J9601 Acute respiratory failure with hypoxia: Secondary | ICD-10-CM | POA: Diagnosis not present

## 2020-10-05 DIAGNOSIS — Z881 Allergy status to other antibiotic agents status: Secondary | ICD-10-CM | POA: Diagnosis not present

## 2020-10-05 DIAGNOSIS — R918 Other nonspecific abnormal finding of lung field: Secondary | ICD-10-CM | POA: Diagnosis not present

## 2020-10-05 DIAGNOSIS — Z7982 Long term (current) use of aspirin: Secondary | ICD-10-CM | POA: Diagnosis not present

## 2020-10-05 DIAGNOSIS — J449 Chronic obstructive pulmonary disease, unspecified: Secondary | ICD-10-CM | POA: Diagnosis not present

## 2020-10-05 DIAGNOSIS — Z043 Encounter for examination and observation following other accident: Secondary | ICD-10-CM | POA: Diagnosis not present

## 2020-10-05 DIAGNOSIS — S270XXA Traumatic pneumothorax, initial encounter: Secondary | ICD-10-CM | POA: Diagnosis not present

## 2020-10-05 DIAGNOSIS — R911 Solitary pulmonary nodule: Secondary | ICD-10-CM | POA: Diagnosis not present

## 2020-10-05 DIAGNOSIS — S199XXA Unspecified injury of neck, initial encounter: Secondary | ICD-10-CM | POA: Diagnosis not present

## 2020-10-05 DIAGNOSIS — S3991XA Unspecified injury of abdomen, initial encounter: Secondary | ICD-10-CM | POA: Diagnosis not present

## 2020-10-05 DIAGNOSIS — E871 Hypo-osmolality and hyponatremia: Secondary | ICD-10-CM | POA: Diagnosis not present

## 2020-10-05 DIAGNOSIS — Z20822 Contact with and (suspected) exposure to covid-19: Secondary | ICD-10-CM | POA: Diagnosis not present

## 2020-10-05 DIAGNOSIS — S3993XA Unspecified injury of pelvis, initial encounter: Secondary | ICD-10-CM | POA: Diagnosis not present

## 2020-10-05 DIAGNOSIS — S0990XA Unspecified injury of head, initial encounter: Secondary | ICD-10-CM | POA: Diagnosis not present

## 2020-10-05 DIAGNOSIS — S41112A Laceration without foreign body of left upper arm, initial encounter: Secondary | ICD-10-CM | POA: Diagnosis not present

## 2020-10-05 DIAGNOSIS — Z79899 Other long term (current) drug therapy: Secondary | ICD-10-CM | POA: Diagnosis not present

## 2020-10-11 DIAGNOSIS — S2249XA Multiple fractures of ribs, unspecified side, initial encounter for closed fracture: Secondary | ICD-10-CM | POA: Diagnosis not present

## 2020-10-11 DIAGNOSIS — J939 Pneumothorax, unspecified: Secondary | ICD-10-CM | POA: Diagnosis not present

## 2020-10-11 DIAGNOSIS — Z681 Body mass index (BMI) 19 or less, adult: Secondary | ICD-10-CM | POA: Diagnosis not present

## 2020-10-11 DIAGNOSIS — Z7689 Persons encountering health services in other specified circumstances: Secondary | ICD-10-CM | POA: Diagnosis not present

## 2020-10-12 DIAGNOSIS — J449 Chronic obstructive pulmonary disease, unspecified: Secondary | ICD-10-CM | POA: Diagnosis not present

## 2020-10-13 DIAGNOSIS — R911 Solitary pulmonary nodule: Secondary | ICD-10-CM | POA: Diagnosis not present

## 2020-10-13 DIAGNOSIS — G8929 Other chronic pain: Secondary | ICD-10-CM | POA: Diagnosis not present

## 2020-10-13 DIAGNOSIS — M1990 Unspecified osteoarthritis, unspecified site: Secondary | ICD-10-CM | POA: Diagnosis not present

## 2020-10-13 DIAGNOSIS — Z9981 Dependence on supplemental oxygen: Secondary | ICD-10-CM | POA: Diagnosis not present

## 2020-10-13 DIAGNOSIS — E440 Moderate protein-calorie malnutrition: Secondary | ICD-10-CM | POA: Diagnosis not present

## 2020-10-13 DIAGNOSIS — Z86711 Personal history of pulmonary embolism: Secondary | ICD-10-CM | POA: Diagnosis not present

## 2020-10-13 DIAGNOSIS — S2242XD Multiple fractures of ribs, left side, subsequent encounter for fracture with routine healing: Secondary | ICD-10-CM | POA: Diagnosis not present

## 2020-10-13 DIAGNOSIS — J449 Chronic obstructive pulmonary disease, unspecified: Secondary | ICD-10-CM | POA: Diagnosis not present

## 2020-10-13 DIAGNOSIS — J9601 Acute respiratory failure with hypoxia: Secondary | ICD-10-CM | POA: Diagnosis not present

## 2020-10-13 DIAGNOSIS — Z8616 Personal history of COVID-19: Secondary | ICD-10-CM | POA: Diagnosis not present

## 2020-10-13 DIAGNOSIS — F17210 Nicotine dependence, cigarettes, uncomplicated: Secondary | ICD-10-CM | POA: Diagnosis not present

## 2020-10-13 DIAGNOSIS — F32A Depression, unspecified: Secondary | ICD-10-CM | POA: Diagnosis not present

## 2020-10-13 DIAGNOSIS — S270XXD Traumatic pneumothorax, subsequent encounter: Secondary | ICD-10-CM | POA: Diagnosis not present

## 2020-10-13 DIAGNOSIS — E871 Hypo-osmolality and hyponatremia: Secondary | ICD-10-CM | POA: Diagnosis not present

## 2020-10-13 DIAGNOSIS — S51812D Laceration without foreign body of left forearm, subsequent encounter: Secondary | ICD-10-CM | POA: Diagnosis not present

## 2020-10-13 DIAGNOSIS — Z9181 History of falling: Secondary | ICD-10-CM | POA: Diagnosis not present

## 2020-10-13 DIAGNOSIS — T797XXD Traumatic subcutaneous emphysema, subsequent encounter: Secondary | ICD-10-CM | POA: Diagnosis not present

## 2020-10-16 DIAGNOSIS — S270XXD Traumatic pneumothorax, subsequent encounter: Secondary | ICD-10-CM | POA: Diagnosis not present

## 2020-10-16 DIAGNOSIS — J449 Chronic obstructive pulmonary disease, unspecified: Secondary | ICD-10-CM | POA: Diagnosis not present

## 2020-10-16 DIAGNOSIS — E440 Moderate protein-calorie malnutrition: Secondary | ICD-10-CM | POA: Diagnosis not present

## 2020-10-16 DIAGNOSIS — F32A Depression, unspecified: Secondary | ICD-10-CM | POA: Diagnosis not present

## 2020-10-16 DIAGNOSIS — S272XXD Traumatic hemopneumothorax, subsequent encounter: Secondary | ICD-10-CM | POA: Diagnosis not present

## 2020-10-16 DIAGNOSIS — S51812D Laceration without foreign body of left forearm, subsequent encounter: Secondary | ICD-10-CM | POA: Diagnosis not present

## 2020-10-16 DIAGNOSIS — S2242XD Multiple fractures of ribs, left side, subsequent encounter for fracture with routine healing: Secondary | ICD-10-CM

## 2020-10-16 DIAGNOSIS — Z9181 History of falling: Secondary | ICD-10-CM | POA: Diagnosis not present

## 2020-10-16 DIAGNOSIS — F17210 Nicotine dependence, cigarettes, uncomplicated: Secondary | ICD-10-CM | POA: Diagnosis not present

## 2020-10-16 DIAGNOSIS — E871 Hypo-osmolality and hyponatremia: Secondary | ICD-10-CM | POA: Diagnosis not present

## 2020-10-16 DIAGNOSIS — S41112D Laceration without foreign body of left upper arm, subsequent encounter: Secondary | ICD-10-CM

## 2020-10-16 DIAGNOSIS — M1990 Unspecified osteoarthritis, unspecified site: Secondary | ICD-10-CM | POA: Diagnosis not present

## 2020-10-16 DIAGNOSIS — Z9981 Dependence on supplemental oxygen: Secondary | ICD-10-CM | POA: Diagnosis not present

## 2020-10-16 DIAGNOSIS — Z8616 Personal history of COVID-19: Secondary | ICD-10-CM | POA: Diagnosis not present

## 2020-10-16 DIAGNOSIS — T797XXD Traumatic subcutaneous emphysema, subsequent encounter: Secondary | ICD-10-CM | POA: Diagnosis not present

## 2020-10-16 DIAGNOSIS — G8929 Other chronic pain: Secondary | ICD-10-CM | POA: Diagnosis not present

## 2020-10-16 DIAGNOSIS — J9601 Acute respiratory failure with hypoxia: Secondary | ICD-10-CM | POA: Diagnosis not present

## 2020-10-16 DIAGNOSIS — R911 Solitary pulmonary nodule: Secondary | ICD-10-CM | POA: Diagnosis not present

## 2020-10-16 DIAGNOSIS — Z86711 Personal history of pulmonary embolism: Secondary | ICD-10-CM | POA: Diagnosis not present

## 2020-10-18 DIAGNOSIS — J449 Chronic obstructive pulmonary disease, unspecified: Secondary | ICD-10-CM | POA: Diagnosis not present

## 2020-10-18 DIAGNOSIS — S41119A Laceration without foreign body of unspecified upper arm, initial encounter: Secondary | ICD-10-CM | POA: Diagnosis not present

## 2020-10-18 DIAGNOSIS — T797XXA Traumatic subcutaneous emphysema, initial encounter: Secondary | ICD-10-CM | POA: Diagnosis not present

## 2020-10-18 DIAGNOSIS — R911 Solitary pulmonary nodule: Secondary | ICD-10-CM | POA: Diagnosis not present

## 2020-10-20 DIAGNOSIS — Z9181 History of falling: Secondary | ICD-10-CM | POA: Diagnosis not present

## 2020-10-20 DIAGNOSIS — E440 Moderate protein-calorie malnutrition: Secondary | ICD-10-CM | POA: Diagnosis not present

## 2020-10-20 DIAGNOSIS — E871 Hypo-osmolality and hyponatremia: Secondary | ICD-10-CM | POA: Diagnosis not present

## 2020-10-20 DIAGNOSIS — S51812D Laceration without foreign body of left forearm, subsequent encounter: Secondary | ICD-10-CM | POA: Diagnosis not present

## 2020-10-20 DIAGNOSIS — J449 Chronic obstructive pulmonary disease, unspecified: Secondary | ICD-10-CM | POA: Diagnosis not present

## 2020-10-20 DIAGNOSIS — G8929 Other chronic pain: Secondary | ICD-10-CM | POA: Diagnosis not present

## 2020-10-20 DIAGNOSIS — F17210 Nicotine dependence, cigarettes, uncomplicated: Secondary | ICD-10-CM | POA: Diagnosis not present

## 2020-10-20 DIAGNOSIS — R911 Solitary pulmonary nodule: Secondary | ICD-10-CM | POA: Diagnosis not present

## 2020-10-20 DIAGNOSIS — Z9981 Dependence on supplemental oxygen: Secondary | ICD-10-CM | POA: Diagnosis not present

## 2020-10-20 DIAGNOSIS — T797XXD Traumatic subcutaneous emphysema, subsequent encounter: Secondary | ICD-10-CM | POA: Diagnosis not present

## 2020-10-20 DIAGNOSIS — J9601 Acute respiratory failure with hypoxia: Secondary | ICD-10-CM | POA: Diagnosis not present

## 2020-10-20 DIAGNOSIS — S270XXD Traumatic pneumothorax, subsequent encounter: Secondary | ICD-10-CM | POA: Diagnosis not present

## 2020-10-20 DIAGNOSIS — S2242XD Multiple fractures of ribs, left side, subsequent encounter for fracture with routine healing: Secondary | ICD-10-CM | POA: Diagnosis not present

## 2020-10-20 DIAGNOSIS — Z86711 Personal history of pulmonary embolism: Secondary | ICD-10-CM | POA: Diagnosis not present

## 2020-10-20 DIAGNOSIS — M1990 Unspecified osteoarthritis, unspecified site: Secondary | ICD-10-CM | POA: Diagnosis not present

## 2020-10-20 DIAGNOSIS — Z8616 Personal history of COVID-19: Secondary | ICD-10-CM | POA: Diagnosis not present

## 2020-10-20 DIAGNOSIS — F32A Depression, unspecified: Secondary | ICD-10-CM | POA: Diagnosis not present

## 2020-10-23 DIAGNOSIS — T797XXD Traumatic subcutaneous emphysema, subsequent encounter: Secondary | ICD-10-CM | POA: Diagnosis not present

## 2020-10-23 DIAGNOSIS — Z86711 Personal history of pulmonary embolism: Secondary | ICD-10-CM | POA: Diagnosis not present

## 2020-10-23 DIAGNOSIS — E871 Hypo-osmolality and hyponatremia: Secondary | ICD-10-CM | POA: Diagnosis not present

## 2020-10-23 DIAGNOSIS — S51812D Laceration without foreign body of left forearm, subsequent encounter: Secondary | ICD-10-CM | POA: Diagnosis not present

## 2020-10-23 DIAGNOSIS — M1990 Unspecified osteoarthritis, unspecified site: Secondary | ICD-10-CM | POA: Diagnosis not present

## 2020-10-23 DIAGNOSIS — R911 Solitary pulmonary nodule: Secondary | ICD-10-CM | POA: Diagnosis not present

## 2020-10-23 DIAGNOSIS — J9601 Acute respiratory failure with hypoxia: Secondary | ICD-10-CM | POA: Diagnosis not present

## 2020-10-23 DIAGNOSIS — G8929 Other chronic pain: Secondary | ICD-10-CM | POA: Diagnosis not present

## 2020-10-23 DIAGNOSIS — F17210 Nicotine dependence, cigarettes, uncomplicated: Secondary | ICD-10-CM | POA: Diagnosis not present

## 2020-10-23 DIAGNOSIS — Z9181 History of falling: Secondary | ICD-10-CM | POA: Diagnosis not present

## 2020-10-23 DIAGNOSIS — E440 Moderate protein-calorie malnutrition: Secondary | ICD-10-CM | POA: Diagnosis not present

## 2020-10-23 DIAGNOSIS — F32A Depression, unspecified: Secondary | ICD-10-CM | POA: Diagnosis not present

## 2020-10-23 DIAGNOSIS — S270XXD Traumatic pneumothorax, subsequent encounter: Secondary | ICD-10-CM | POA: Diagnosis not present

## 2020-10-23 DIAGNOSIS — J449 Chronic obstructive pulmonary disease, unspecified: Secondary | ICD-10-CM | POA: Diagnosis not present

## 2020-10-23 DIAGNOSIS — S2242XD Multiple fractures of ribs, left side, subsequent encounter for fracture with routine healing: Secondary | ICD-10-CM | POA: Diagnosis not present

## 2020-10-23 DIAGNOSIS — Z8616 Personal history of COVID-19: Secondary | ICD-10-CM | POA: Diagnosis not present

## 2020-10-23 DIAGNOSIS — Z9981 Dependence on supplemental oxygen: Secondary | ICD-10-CM | POA: Diagnosis not present

## 2020-10-24 DIAGNOSIS — E871 Hypo-osmolality and hyponatremia: Secondary | ICD-10-CM | POA: Diagnosis not present

## 2020-10-24 DIAGNOSIS — J9601 Acute respiratory failure with hypoxia: Secondary | ICD-10-CM | POA: Diagnosis not present

## 2020-10-24 DIAGNOSIS — Z9981 Dependence on supplemental oxygen: Secondary | ICD-10-CM | POA: Diagnosis not present

## 2020-10-24 DIAGNOSIS — R911 Solitary pulmonary nodule: Secondary | ICD-10-CM | POA: Diagnosis not present

## 2020-10-24 DIAGNOSIS — S51812D Laceration without foreign body of left forearm, subsequent encounter: Secondary | ICD-10-CM | POA: Diagnosis not present

## 2020-10-24 DIAGNOSIS — J449 Chronic obstructive pulmonary disease, unspecified: Secondary | ICD-10-CM | POA: Diagnosis not present

## 2020-10-24 DIAGNOSIS — Z9181 History of falling: Secondary | ICD-10-CM | POA: Diagnosis not present

## 2020-10-24 DIAGNOSIS — S270XXD Traumatic pneumothorax, subsequent encounter: Secondary | ICD-10-CM | POA: Diagnosis not present

## 2020-10-24 DIAGNOSIS — Z8616 Personal history of COVID-19: Secondary | ICD-10-CM | POA: Diagnosis not present

## 2020-10-24 DIAGNOSIS — S2242XD Multiple fractures of ribs, left side, subsequent encounter for fracture with routine healing: Secondary | ICD-10-CM | POA: Diagnosis not present

## 2020-10-24 DIAGNOSIS — G8929 Other chronic pain: Secondary | ICD-10-CM | POA: Diagnosis not present

## 2020-10-24 DIAGNOSIS — T797XXD Traumatic subcutaneous emphysema, subsequent encounter: Secondary | ICD-10-CM | POA: Diagnosis not present

## 2020-10-24 DIAGNOSIS — Z86711 Personal history of pulmonary embolism: Secondary | ICD-10-CM | POA: Diagnosis not present

## 2020-10-24 DIAGNOSIS — M1990 Unspecified osteoarthritis, unspecified site: Secondary | ICD-10-CM | POA: Diagnosis not present

## 2020-10-24 DIAGNOSIS — F17210 Nicotine dependence, cigarettes, uncomplicated: Secondary | ICD-10-CM | POA: Diagnosis not present

## 2020-10-24 DIAGNOSIS — F32A Depression, unspecified: Secondary | ICD-10-CM | POA: Diagnosis not present

## 2020-10-24 DIAGNOSIS — E440 Moderate protein-calorie malnutrition: Secondary | ICD-10-CM | POA: Diagnosis not present

## 2020-10-26 DIAGNOSIS — B370 Candidal stomatitis: Secondary | ICD-10-CM | POA: Diagnosis not present

## 2020-10-26 DIAGNOSIS — E871 Hypo-osmolality and hyponatremia: Secondary | ICD-10-CM | POA: Diagnosis not present

## 2020-10-26 DIAGNOSIS — S2249XD Multiple fractures of ribs, unspecified side, subsequent encounter for fracture with routine healing: Secondary | ICD-10-CM | POA: Diagnosis not present

## 2020-10-26 DIAGNOSIS — R911 Solitary pulmonary nodule: Secondary | ICD-10-CM | POA: Diagnosis not present

## 2020-10-26 DIAGNOSIS — B9562 Methicillin resistant Staphylococcus aureus infection as the cause of diseases classified elsewhere: Secondary | ICD-10-CM | POA: Diagnosis not present

## 2020-10-26 DIAGNOSIS — W19XXXA Unspecified fall, initial encounter: Secondary | ICD-10-CM | POA: Diagnosis not present

## 2020-10-26 DIAGNOSIS — Z452 Encounter for adjustment and management of vascular access device: Secondary | ICD-10-CM | POA: Diagnosis not present

## 2020-10-26 DIAGNOSIS — Z86711 Personal history of pulmonary embolism: Secondary | ICD-10-CM | POA: Diagnosis not present

## 2020-10-26 DIAGNOSIS — W11XXXD Fall on and from ladder, subsequent encounter: Secondary | ICD-10-CM | POA: Diagnosis not present

## 2020-10-26 DIAGNOSIS — F32A Depression, unspecified: Secondary | ICD-10-CM | POA: Diagnosis not present

## 2020-10-26 DIAGNOSIS — J440 Chronic obstructive pulmonary disease with acute lower respiratory infection: Secondary | ICD-10-CM | POA: Diagnosis not present

## 2020-10-26 DIAGNOSIS — R609 Edema, unspecified: Secondary | ICD-10-CM | POA: Diagnosis not present

## 2020-10-26 DIAGNOSIS — Z720 Tobacco use: Secondary | ICD-10-CM | POA: Diagnosis not present

## 2020-10-26 DIAGNOSIS — Z87891 Personal history of nicotine dependence: Secondary | ICD-10-CM | POA: Diagnosis not present

## 2020-10-26 DIAGNOSIS — J9611 Chronic respiratory failure with hypoxia: Secondary | ICD-10-CM | POA: Diagnosis not present

## 2020-10-26 DIAGNOSIS — E876 Hypokalemia: Secondary | ICD-10-CM | POA: Diagnosis not present

## 2020-10-26 DIAGNOSIS — I313 Pericardial effusion (noninflammatory): Secondary | ICD-10-CM | POA: Diagnosis not present

## 2020-10-26 DIAGNOSIS — R600 Localized edema: Secondary | ICD-10-CM | POA: Diagnosis not present

## 2020-10-26 DIAGNOSIS — A4102 Sepsis due to Methicillin resistant Staphylococcus aureus: Secondary | ICD-10-CM | POA: Diagnosis not present

## 2020-10-26 DIAGNOSIS — Z8616 Personal history of COVID-19: Secondary | ICD-10-CM | POA: Diagnosis not present

## 2020-10-26 DIAGNOSIS — Z681 Body mass index (BMI) 19 or less, adult: Secondary | ICD-10-CM | POA: Diagnosis not present

## 2020-10-26 DIAGNOSIS — J90 Pleural effusion, not elsewhere classified: Secondary | ICD-10-CM | POA: Diagnosis not present

## 2020-10-26 DIAGNOSIS — E43 Unspecified severe protein-calorie malnutrition: Secondary | ICD-10-CM | POA: Diagnosis not present

## 2020-10-26 DIAGNOSIS — Z792 Long term (current) use of antibiotics: Secondary | ICD-10-CM | POA: Diagnosis not present

## 2020-10-26 DIAGNOSIS — Z743 Need for continuous supervision: Secondary | ICD-10-CM | POA: Diagnosis not present

## 2020-10-26 DIAGNOSIS — J449 Chronic obstructive pulmonary disease, unspecified: Secondary | ICD-10-CM | POA: Diagnosis not present

## 2020-10-26 DIAGNOSIS — Z883 Allergy status to other anti-infective agents status: Secondary | ICD-10-CM | POA: Diagnosis not present

## 2020-10-26 DIAGNOSIS — R531 Weakness: Secondary | ICD-10-CM | POA: Diagnosis not present

## 2020-10-26 DIAGNOSIS — R6889 Other general symptoms and signs: Secondary | ICD-10-CM | POA: Diagnosis not present

## 2020-10-26 DIAGNOSIS — Z66 Do not resuscitate: Secondary | ICD-10-CM | POA: Diagnosis not present

## 2020-10-26 DIAGNOSIS — R7881 Bacteremia: Secondary | ICD-10-CM | POA: Diagnosis not present

## 2020-10-26 DIAGNOSIS — I088 Other rheumatic multiple valve diseases: Secondary | ICD-10-CM | POA: Diagnosis not present

## 2020-10-26 DIAGNOSIS — M1990 Unspecified osteoarthritis, unspecified site: Secondary | ICD-10-CM | POA: Diagnosis not present

## 2020-10-26 DIAGNOSIS — Z79899 Other long term (current) drug therapy: Secondary | ICD-10-CM | POA: Diagnosis not present

## 2020-10-27 DIAGNOSIS — E43 Unspecified severe protein-calorie malnutrition: Secondary | ICD-10-CM | POA: Diagnosis not present

## 2020-10-27 DIAGNOSIS — B370 Candidal stomatitis: Secondary | ICD-10-CM | POA: Diagnosis not present

## 2020-10-28 DIAGNOSIS — E43 Unspecified severe protein-calorie malnutrition: Secondary | ICD-10-CM | POA: Diagnosis not present

## 2020-10-28 DIAGNOSIS — B370 Candidal stomatitis: Secondary | ICD-10-CM | POA: Diagnosis not present

## 2020-10-29 DIAGNOSIS — E43 Unspecified severe protein-calorie malnutrition: Secondary | ICD-10-CM | POA: Diagnosis not present

## 2020-10-29 DIAGNOSIS — B370 Candidal stomatitis: Secondary | ICD-10-CM | POA: Diagnosis not present

## 2020-10-30 DIAGNOSIS — I088 Other rheumatic multiple valve diseases: Secondary | ICD-10-CM

## 2020-10-30 DIAGNOSIS — B370 Candidal stomatitis: Secondary | ICD-10-CM | POA: Diagnosis not present

## 2020-10-30 DIAGNOSIS — E43 Unspecified severe protein-calorie malnutrition: Secondary | ICD-10-CM | POA: Diagnosis not present

## 2020-10-30 DIAGNOSIS — J90 Pleural effusion, not elsewhere classified: Secondary | ICD-10-CM | POA: Diagnosis not present

## 2020-10-30 DIAGNOSIS — Z452 Encounter for adjustment and management of vascular access device: Secondary | ICD-10-CM | POA: Diagnosis not present

## 2020-10-31 DIAGNOSIS — Z720 Tobacco use: Secondary | ICD-10-CM | POA: Diagnosis not present

## 2020-10-31 DIAGNOSIS — E039 Hypothyroidism, unspecified: Secondary | ICD-10-CM | POA: Diagnosis not present

## 2020-10-31 DIAGNOSIS — S2249XD Multiple fractures of ribs, unspecified side, subsequent encounter for fracture with routine healing: Secondary | ICD-10-CM | POA: Diagnosis not present

## 2020-10-31 DIAGNOSIS — R9431 Abnormal electrocardiogram [ECG] [EKG]: Secondary | ICD-10-CM | POA: Diagnosis not present

## 2020-10-31 DIAGNOSIS — S51812D Laceration without foreign body of left forearm, subsequent encounter: Secondary | ICD-10-CM | POA: Diagnosis not present

## 2020-10-31 DIAGNOSIS — E876 Hypokalemia: Secondary | ICD-10-CM | POA: Diagnosis not present

## 2020-10-31 DIAGNOSIS — J449 Chronic obstructive pulmonary disease, unspecified: Secondary | ICD-10-CM | POA: Diagnosis not present

## 2020-10-31 DIAGNOSIS — W19XXXA Unspecified fall, initial encounter: Secondary | ICD-10-CM | POA: Diagnosis not present

## 2020-10-31 DIAGNOSIS — W11XXXD Fall on and from ladder, subsequent encounter: Secondary | ICD-10-CM | POA: Diagnosis not present

## 2020-10-31 DIAGNOSIS — M25531 Pain in right wrist: Secondary | ICD-10-CM | POA: Diagnosis not present

## 2020-10-31 DIAGNOSIS — E46 Unspecified protein-calorie malnutrition: Secondary | ICD-10-CM | POA: Diagnosis not present

## 2020-10-31 DIAGNOSIS — J9811 Atelectasis: Secondary | ICD-10-CM | POA: Diagnosis not present

## 2020-10-31 DIAGNOSIS — E43 Unspecified severe protein-calorie malnutrition: Secondary | ICD-10-CM | POA: Diagnosis not present

## 2020-10-31 DIAGNOSIS — S2242XD Multiple fractures of ribs, left side, subsequent encounter for fracture with routine healing: Secondary | ICD-10-CM | POA: Diagnosis not present

## 2020-10-31 DIAGNOSIS — B370 Candidal stomatitis: Secondary | ICD-10-CM | POA: Diagnosis not present

## 2020-10-31 DIAGNOSIS — R0902 Hypoxemia: Secondary | ICD-10-CM | POA: Diagnosis not present

## 2020-10-31 DIAGNOSIS — D519 Vitamin B12 deficiency anemia, unspecified: Secondary | ICD-10-CM | POA: Diagnosis not present

## 2020-10-31 DIAGNOSIS — R911 Solitary pulmonary nodule: Secondary | ICD-10-CM | POA: Diagnosis not present

## 2020-10-31 DIAGNOSIS — S271XXD Traumatic hemothorax, subsequent encounter: Secondary | ICD-10-CM | POA: Diagnosis not present

## 2020-10-31 DIAGNOSIS — J90 Pleural effusion, not elsewhere classified: Secondary | ICD-10-CM | POA: Diagnosis not present

## 2020-10-31 DIAGNOSIS — J45909 Unspecified asthma, uncomplicated: Secondary | ICD-10-CM | POA: Diagnosis not present

## 2020-10-31 DIAGNOSIS — J439 Emphysema, unspecified: Secondary | ICD-10-CM | POA: Diagnosis not present

## 2020-10-31 DIAGNOSIS — J9621 Acute and chronic respiratory failure with hypoxia: Secondary | ICD-10-CM | POA: Diagnosis not present

## 2020-10-31 DIAGNOSIS — R7881 Bacteremia: Secondary | ICD-10-CM | POA: Diagnosis not present

## 2020-10-31 DIAGNOSIS — M19041 Primary osteoarthritis, right hand: Secondary | ICD-10-CM | POA: Diagnosis not present

## 2020-10-31 DIAGNOSIS — G8911 Acute pain due to trauma: Secondary | ICD-10-CM | POA: Diagnosis not present

## 2020-10-31 DIAGNOSIS — R7303 Prediabetes: Secondary | ICD-10-CM | POA: Diagnosis not present

## 2020-10-31 DIAGNOSIS — M170 Bilateral primary osteoarthritis of knee: Secondary | ICD-10-CM | POA: Diagnosis not present

## 2020-10-31 DIAGNOSIS — Z20822 Contact with and (suspected) exposure to covid-19: Secondary | ICD-10-CM | POA: Diagnosis not present

## 2020-10-31 DIAGNOSIS — R1012 Left upper quadrant pain: Secondary | ICD-10-CM | POA: Diagnosis not present

## 2020-10-31 DIAGNOSIS — Z723 Lack of physical exercise: Secondary | ICD-10-CM | POA: Diagnosis not present

## 2020-10-31 DIAGNOSIS — Z743 Need for continuous supervision: Secondary | ICD-10-CM | POA: Diagnosis not present

## 2020-10-31 DIAGNOSIS — F32A Depression, unspecified: Secondary | ICD-10-CM | POA: Diagnosis not present

## 2020-10-31 DIAGNOSIS — M79644 Pain in right finger(s): Secondary | ICD-10-CM | POA: Diagnosis not present

## 2020-10-31 DIAGNOSIS — E871 Hypo-osmolality and hyponatremia: Secondary | ICD-10-CM | POA: Diagnosis not present

## 2020-10-31 DIAGNOSIS — M79641 Pain in right hand: Secondary | ICD-10-CM | POA: Diagnosis not present

## 2020-10-31 DIAGNOSIS — M79646 Pain in unspecified finger(s): Secondary | ICD-10-CM | POA: Diagnosis not present

## 2020-10-31 DIAGNOSIS — I313 Pericardial effusion (noninflammatory): Secondary | ICD-10-CM | POA: Diagnosis not present

## 2020-10-31 DIAGNOSIS — E441 Mild protein-calorie malnutrition: Secondary | ICD-10-CM | POA: Diagnosis not present

## 2020-10-31 DIAGNOSIS — I959 Hypotension, unspecified: Secondary | ICD-10-CM | POA: Diagnosis present

## 2020-10-31 DIAGNOSIS — F17210 Nicotine dependence, cigarettes, uncomplicated: Secondary | ICD-10-CM | POA: Diagnosis not present

## 2020-10-31 DIAGNOSIS — Z79899 Other long term (current) drug therapy: Secondary | ICD-10-CM | POA: Diagnosis not present

## 2020-10-31 DIAGNOSIS — S6991XA Unspecified injury of right wrist, hand and finger(s), initial encounter: Secondary | ICD-10-CM | POA: Diagnosis not present

## 2020-10-31 DIAGNOSIS — J9611 Chronic respiratory failure with hypoxia: Secondary | ICD-10-CM | POA: Diagnosis not present

## 2020-10-31 DIAGNOSIS — R531 Weakness: Secondary | ICD-10-CM | POA: Diagnosis not present

## 2020-11-01 DIAGNOSIS — Z79899 Other long term (current) drug therapy: Secondary | ICD-10-CM | POA: Diagnosis not present

## 2020-11-03 DIAGNOSIS — Z79899 Other long term (current) drug therapy: Secondary | ICD-10-CM | POA: Diagnosis not present

## 2020-11-04 DIAGNOSIS — E441 Mild protein-calorie malnutrition: Secondary | ICD-10-CM | POA: Diagnosis not present

## 2020-11-04 DIAGNOSIS — R1012 Left upper quadrant pain: Secondary | ICD-10-CM | POA: Diagnosis not present

## 2020-11-04 DIAGNOSIS — M25531 Pain in right wrist: Secondary | ICD-10-CM | POA: Diagnosis not present

## 2020-11-04 DIAGNOSIS — Z723 Lack of physical exercise: Secondary | ICD-10-CM | POA: Diagnosis not present

## 2020-11-04 DIAGNOSIS — M79641 Pain in right hand: Secondary | ICD-10-CM | POA: Diagnosis not present

## 2020-11-04 DIAGNOSIS — M170 Bilateral primary osteoarthritis of knee: Secondary | ICD-10-CM | POA: Diagnosis not present

## 2020-11-05 ENCOUNTER — Observation Stay (HOSPITAL_COMMUNITY): Payer: Medicare Other

## 2020-11-05 ENCOUNTER — Emergency Department (HOSPITAL_COMMUNITY): Payer: Medicare Other

## 2020-11-05 ENCOUNTER — Encounter (HOSPITAL_COMMUNITY): Payer: Self-pay | Admitting: Emergency Medicine

## 2020-11-05 ENCOUNTER — Observation Stay (HOSPITAL_COMMUNITY)
Admission: EM | Admit: 2020-11-05 | Discharge: 2020-11-08 | Disposition: A | Discharge status: Skilled Nursing Facility | Payer: Medicare Other | Attending: Internal Medicine | Admitting: Internal Medicine

## 2020-11-05 DIAGNOSIS — S41112D Laceration without foreign body of left upper arm, subsequent encounter: Secondary | ICD-10-CM

## 2020-11-05 DIAGNOSIS — M19041 Primary osteoarthritis, right hand: Secondary | ICD-10-CM | POA: Diagnosis not present

## 2020-11-05 DIAGNOSIS — J45909 Unspecified asthma, uncomplicated: Secondary | ICD-10-CM | POA: Insufficient documentation

## 2020-11-05 DIAGNOSIS — J189 Pneumonia, unspecified organism: Secondary | ICD-10-CM

## 2020-11-05 DIAGNOSIS — S51812D Laceration without foreign body of left forearm, subsequent encounter: Secondary | ICD-10-CM | POA: Insufficient documentation

## 2020-11-05 DIAGNOSIS — M79644 Pain in right finger(s): Secondary | ICD-10-CM | POA: Diagnosis not present

## 2020-11-05 DIAGNOSIS — E46 Unspecified protein-calorie malnutrition: Secondary | ICD-10-CM | POA: Insufficient documentation

## 2020-11-05 DIAGNOSIS — B370 Candidal stomatitis: Secondary | ICD-10-CM

## 2020-11-05 DIAGNOSIS — S6991XA Unspecified injury of right wrist, hand and finger(s), initial encounter: Secondary | ICD-10-CM | POA: Diagnosis not present

## 2020-11-05 DIAGNOSIS — F17210 Nicotine dependence, cigarettes, uncomplicated: Secondary | ICD-10-CM | POA: Diagnosis not present

## 2020-11-05 DIAGNOSIS — Z79899 Other long term (current) drug therapy: Secondary | ICD-10-CM | POA: Insufficient documentation

## 2020-11-05 DIAGNOSIS — F17200 Nicotine dependence, unspecified, uncomplicated: Secondary | ICD-10-CM | POA: Diagnosis present

## 2020-11-05 DIAGNOSIS — S272XXD Traumatic hemopneumothorax, subsequent encounter: Secondary | ICD-10-CM

## 2020-11-05 DIAGNOSIS — R7303 Prediabetes: Secondary | ICD-10-CM | POA: Insufficient documentation

## 2020-11-05 DIAGNOSIS — S2242XD Multiple fractures of ribs, left side, subsequent encounter for fracture with routine healing: Secondary | ICD-10-CM | POA: Insufficient documentation

## 2020-11-05 DIAGNOSIS — Z20822 Contact with and (suspected) exposure to covid-19: Secondary | ICD-10-CM | POA: Diagnosis not present

## 2020-11-05 DIAGNOSIS — W19XXXA Unspecified fall, initial encounter: Secondary | ICD-10-CM

## 2020-11-05 DIAGNOSIS — E039 Hypothyroidism, unspecified: Secondary | ICD-10-CM | POA: Diagnosis not present

## 2020-11-05 DIAGNOSIS — J449 Chronic obstructive pulmonary disease, unspecified: Secondary | ICD-10-CM | POA: Insufficient documentation

## 2020-11-05 DIAGNOSIS — D649 Anemia, unspecified: Secondary | ICD-10-CM | POA: Diagnosis present

## 2020-11-05 DIAGNOSIS — J90 Pleural effusion, not elsewhere classified: Secondary | ICD-10-CM | POA: Diagnosis not present

## 2020-11-05 DIAGNOSIS — J9621 Acute and chronic respiratory failure with hypoxia: Principal | ICD-10-CM | POA: Insufficient documentation

## 2020-11-05 DIAGNOSIS — E538 Deficiency of other specified B group vitamins: Secondary | ICD-10-CM | POA: Diagnosis present

## 2020-11-05 DIAGNOSIS — W11XXXD Fall on and from ladder, subsequent encounter: Secondary | ICD-10-CM | POA: Diagnosis not present

## 2020-11-05 DIAGNOSIS — J439 Emphysema, unspecified: Secondary | ICD-10-CM | POA: Diagnosis not present

## 2020-11-05 DIAGNOSIS — D638 Anemia in other chronic diseases classified elsewhere: Secondary | ICD-10-CM | POA: Diagnosis present

## 2020-11-05 DIAGNOSIS — S271XXD Traumatic hemothorax, subsequent encounter: Secondary | ICD-10-CM | POA: Diagnosis not present

## 2020-11-05 DIAGNOSIS — J9811 Atelectasis: Secondary | ICD-10-CM | POA: Diagnosis not present

## 2020-11-05 DIAGNOSIS — R0902 Hypoxemia: Secondary | ICD-10-CM | POA: Diagnosis not present

## 2020-11-05 DIAGNOSIS — D519 Vitamin B12 deficiency anemia, unspecified: Secondary | ICD-10-CM | POA: Diagnosis not present

## 2020-11-05 DIAGNOSIS — I959 Hypotension, unspecified: Secondary | ICD-10-CM | POA: Diagnosis present

## 2020-11-05 DIAGNOSIS — G894 Chronic pain syndrome: Secondary | ICD-10-CM | POA: Diagnosis present

## 2020-11-05 DIAGNOSIS — R911 Solitary pulmonary nodule: Secondary | ICD-10-CM | POA: Diagnosis not present

## 2020-11-05 LAB — CBC WITH DIFFERENTIAL/PLATELET
Abs Immature Granulocytes: 0.26 10*3/uL — ABNORMAL HIGH (ref 0.00–0.07)
Basophils Absolute: 0.1 10*3/uL (ref 0.0–0.1)
Basophils Relative: 1 %
Eosinophils Absolute: 0.2 10*3/uL (ref 0.0–0.5)
Eosinophils Relative: 1 %
HCT: 25.7 % — ABNORMAL LOW (ref 39.0–52.0)
Hemoglobin: 8.1 g/dL — ABNORMAL LOW (ref 13.0–17.0)
Immature Granulocytes: 2 %
Lymphocytes Relative: 7 %
Lymphs Abs: 1.1 10*3/uL (ref 0.7–4.0)
MCH: 31.6 pg (ref 26.0–34.0)
MCHC: 31.5 g/dL (ref 30.0–36.0)
MCV: 100.4 fL — ABNORMAL HIGH (ref 80.0–100.0)
Monocytes Absolute: 0.9 10*3/uL (ref 0.1–1.0)
Monocytes Relative: 6 %
Neutro Abs: 12.6 10*3/uL — ABNORMAL HIGH (ref 1.7–7.7)
Neutrophils Relative %: 83 %
Platelets: 345 10*3/uL (ref 150–400)
RBC: 2.56 MIL/uL — ABNORMAL LOW (ref 4.22–5.81)
RDW: 13.2 % (ref 11.5–15.5)
WBC: 15.1 10*3/uL — ABNORMAL HIGH (ref 4.0–10.5)
nRBC: 0 % (ref 0.0–0.2)

## 2020-11-05 LAB — URINALYSIS, ROUTINE W REFLEX MICROSCOPIC
Bilirubin Urine: NEGATIVE
Glucose, UA: NEGATIVE mg/dL
Hgb urine dipstick: NEGATIVE
Ketones, ur: NEGATIVE mg/dL
Leukocytes,Ua: NEGATIVE
Nitrite: NEGATIVE
Protein, ur: NEGATIVE mg/dL
Specific Gravity, Urine: 1.005 (ref 1.005–1.030)
pH: 7 (ref 5.0–8.0)

## 2020-11-05 LAB — COMPREHENSIVE METABOLIC PANEL
ALT: 17 U/L (ref 0–44)
AST: 16 U/L (ref 15–41)
Albumin: 1.7 g/dL — ABNORMAL LOW (ref 3.5–5.0)
Alkaline Phosphatase: 59 U/L (ref 38–126)
Anion gap: 7 (ref 5–15)
BUN: 19 mg/dL (ref 8–23)
CO2: 31 mmol/L (ref 22–32)
Calcium: 8.2 mg/dL — ABNORMAL LOW (ref 8.9–10.3)
Chloride: 98 mmol/L (ref 98–111)
Creatinine, Ser: 1 mg/dL (ref 0.61–1.24)
GFR, Estimated: >60 mL/min (ref >60)
Glucose, Bld: 88 mg/dL (ref 70–99)
Potassium: 3.7 mmol/L (ref 3.5–5.1)
Sodium: 136 mmol/L (ref 135–145)
Total Bilirubin: 0.7 mg/dL (ref 0.3–1.2)
Total Protein: 5.5 g/dL — ABNORMAL LOW (ref 6.5–8.1)

## 2020-11-05 LAB — IRON AND TIBC
Iron: 12 ug/dL — ABNORMAL LOW (ref 45–182)
Saturation Ratios: 9 % — ABNORMAL LOW (ref 17.9–39.5)
TIBC: 135 ug/dL — ABNORMAL LOW (ref 250–450)
UIBC: 123 ug/dL

## 2020-11-05 LAB — CBC
HCT: 18 % — ABNORMAL LOW (ref 39.0–52.0)
Hemoglobin: 5.9 g/dL — CL (ref 13.0–17.0)
MCH: 32.4 pg (ref 26.0–34.0)
MCHC: 32.8 g/dL (ref 30.0–36.0)
MCV: 98.9 fL (ref 80.0–100.0)
Platelets: 442 10*3/uL — ABNORMAL HIGH (ref 150–400)
RBC: 1.82 MIL/uL — ABNORMAL LOW (ref 4.22–5.81)
RDW: 13.2 % (ref 11.5–15.5)
WBC: 17.5 10*3/uL — ABNORMAL HIGH (ref 4.0–10.5)
nRBC: 0 % (ref 0.0–0.2)

## 2020-11-05 LAB — RESP PANEL BY RT-PCR (FLU A&B, COVID) ARPGX2
Influenza A by PCR: NEGATIVE
Influenza B by PCR: NEGATIVE
SARS Coronavirus 2 by RT PCR: NEGATIVE

## 2020-11-05 LAB — VITAMIN B12: Vitamin B-12: 547 pg/mL (ref 180–914)

## 2020-11-05 LAB — LACTIC ACID, PLASMA: Lactic Acid, Venous: 0.7 mmol/L (ref 0.5–1.9)

## 2020-11-05 LAB — VANCOMYCIN, RANDOM: Vancomycin Rm: 18

## 2020-11-05 LAB — C-REACTIVE PROTEIN: CRP: 13.7 mg/dL — ABNORMAL HIGH (ref <1.0)

## 2020-11-05 LAB — FERRITIN: Ferritin: 394 ng/mL — ABNORMAL HIGH (ref 24–336)

## 2020-11-05 LAB — PROCALCITONIN: Procalcitonin: 0.36 ng/mL

## 2020-11-05 LAB — SEDIMENTATION RATE: Sed Rate: >140 mm/hr — ABNORMAL HIGH (ref 0–16)

## 2020-11-05 MED ORDER — VANCOMYCIN HCL 750 MG/150ML IV SOLN
750.0000 mg | Freq: Two times a day (BID) | INTRAVENOUS | Status: DC
  Administered 2020-11-06 – 2020-11-07 (×3): 750 mg via INTRAVENOUS
  Filled 2020-11-05 (×3): qty 150

## 2020-11-05 MED ORDER — SODIUM CHLORIDE 0.9% IV SOLUTION: Freq: Once | INTRAVENOUS | Status: DC

## 2020-11-05 MED ORDER — OXYCODONE HCL 5 MG PO TABS
2.5000 mg | ORAL_TABLET | ORAL | Status: DC | PRN
  Administered 2020-11-05 – 2020-11-08 (×6): 5 mg via ORAL
  Filled 2020-11-05 (×6): qty 1

## 2020-11-05 MED ORDER — PIPERACILLIN-TAZOBACTAM 3.375 G IVPB
3.3750 g | Freq: Three times a day (TID) | INTRAVENOUS | Status: DC
  Administered 2020-11-05 – 2020-11-08 (×8): 3.375 g via INTRAVENOUS
  Filled 2020-11-05 (×8): qty 50

## 2020-11-05 MED ORDER — VITAMIN B-12 1000 MCG PO TABS
1000.0000 ug | ORAL_TABLET | Freq: Every day | ORAL | Status: DC
  Administered 2020-11-06 – 2020-11-08 (×3): 1000 ug via ORAL
  Filled 2020-11-05 (×3): qty 1

## 2020-11-05 NOTE — ED Triage Notes (Signed)
Per [facility], patient from [facility], fall yesterday. R thumb pain since fall. XR completed this morning at facility with reported concern for dislocation.

## 2020-11-05 NOTE — Progress Notes (Addendum)
Pharmacy Antibiotic Note  [name] is a 70 y.o. male admitted on 11/05/2020 with fall, recent discharge from local hospital.  Pt with PICC for prolonged need of vanc. 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 resume vancomyin dosing of '750mg'$  IV q12h Daily Scr F/u renal function and clinical course (end date for vanc?)   Temp (24hrs), Avg:98.3 F (36.8 C), Min:98.3 F (36.8 C), Max:98.3 F (36.8 C)  Recent Labs  Lab 11/05/20 1403  WBC 15.1*  CREATININE 1.00    CrCl cannot be calculated (Unknown ideal weight.).    Allergies  Allergen Reactions   Erythromycin Base Other (See Comments)    hallucinations   Mirtazapine Other (See Comments)   Nsaids Other (See Comments)    Thank you for allowing pharmacy to be a part of this patient's care.  Dolly Rias RPh 11/05/2020, 9:57 PM

## 2020-11-05 NOTE — ED Provider Notes (Signed)
[facility] DEPT Provider Note   CSN: [id number] Arrival date & time: 11/05/20  1107     History Chief Complaint  Patient presents with   Hand Pain   Fall    [name] is a 70 y.o. male who presents from [facility] facility with concern for right hand pain after a fall yesterday.  Per patient understand the reason he is here.  Patient is ill-appearing and hypotensive at time of my arrival to the room.  I personally reviewed this patient's medical records.  He was recently admitted to [facility] for pneumonia.  Case discussed with his nurse at [facility] who states that he was admitted to [facility] for pneumonia and is admitted to this facility on 7/27 with white count at that time of 11.4.  He is currently receiving vancomycin through a PICC line in his right arm for pneumonia and reported bacteremia.  I am unable to see these records from [place] in our computer system.  According to his RN he has become increasingly confused has had low blood pressures with systolics in the 0000000 at his facility and is requiring 3 L of supplemental oxygen by nasal cannula to maintain sats above 90%.  She is concerned for decompensation following recent admission.  Appreciate her input into this patient's care.  I personally viewed the patient medical records.  He has history of emphysema, arthritis, depression, is not on anticoagulation.   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   Malnutrition of moderate degree ([place]: 60% to less than 75% of standard weight) ([place]) 05/19/2020   Mixed hyperlipidemia 05/19/2020   Prediabetes 05/19/2020   Protein-calorie malnutrition, moderate ([place]) 05/19/2020   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/19/2020   Tobacco dependence 05/19/2020   Vitamin B12 deficiency 05/19/2020   Sensory disturbance 04/13/2014   Epigastric pain    Orthostasis    COPD exacerbation ([place]) 04/12/2014   Chest pain 04/12/2014   Numbness and tingling 04/12/2014   CHEST PAIN 06/27/2008   ABNORMAL FINDINGS GI TRACT 06/27/2008   COLONIC POLYPS, HX OF 06/27/2008   WEIGHT LOSS 05/23/2008   EPIGASTRIC PAIN 05/23/2008   NONSPEC ABN FINDNG RAD & OTH EXAM ABDOMINAL AREA 05/23/2008   TOBACCO ABUSE 03/30/2007   EMPHYSEMA 03/30/2007   ASTHMA 03/30/2007   CHRONIC OBSTRUCTIVE PULMONARY DISEASE, MILD 03/30/2007   OSTEOARTHRITIS 03/30/2007   ARTHRITIS 03/30/2007   DEGENERATIVE [place] DISEASE, 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    Past Surgical History:  Procedure Laterality Date   BACK SURGERY     LUMBAR DISC SURGERY Left 06/2003   L4 on L3 hemilaminectomy, foraminotomy for 3-4,4-5 with left L3-L4 extraforaminal diskectomy/notes 08/22/2010    REPAIR DURAL / CSF LEAK  07/2003   [facility] Endo 08/22/2010       Family History  Problem Relation Age of Onset   Diabetes Father    Colon polyps  Father    Heart disease Father    Stroke Father    Heart failure Father    Diabetes Brother    Muscular dystrophy Mother    Diabetes Paternal Uncle    Colon cancer Paternal Grandfather    Diabetes Son     Social History   Tobacco Use   Smoking status: Every Day    Packs/day: 1.50    Years: 43.00    Pack years: 64.50    Types: Cigarettes   Smokeless tobacco: Never   Tobacco comments:    tried chantix couldn't take it  Vaping Use   Vaping Use: Never used  Substance Use Topics   Alcohol use: Not Currently     Alcohol/week: 6.0 standard drinks    Types: 6 Cans of beer per week    Comment: 2 times a week   Drug use: No    Home Medications Prior to Admission medications   Medication Sig Start Date End Date Taking? Authorizing Provider  albuterol (PROVENTIL) (2.5 MG/3ML) 0.083% nebulizer solution Take 2.5 mg by nebulization every 2 (two) hours as needed for wheezing or shortness of breath.   Yes [provider]  benzonatate (TESSALON) 100 MG capsule Take 100 mg by mouth every 8 (eight) hours as needed for cough.   Yes [provider]  OVER THE COUNTER MEDICATION Take 1 Bottle by mouth in the morning, at noon, and at bedtime. Magic cup   Yes [provider]  potassium chloride (KLOR-CON) 10 MEQ tablet Take 10 mEq by mouth daily.   Yes [provider]  promethazine (PHENERGAN) 25 MG tablet Take 25 mg by mouth every 8 (eight) hours as needed for nausea or vomiting.   Yes [provider]  traZODone (DESYREL) 50 MG tablet Take 50 mg by mouth at bedtime as needed for sleep. 02/13/20  Yes [provider]  vancomycin 750 mg in sodium chloride 0.9 % 250 mL Inject 750 mg into the vein 2 (two) times daily.   Yes [provider]  methylPREDNISolone (MEDROL DOSEPAK) 4 MG TBPK tablet 6 Day Taper Pack. Take as Directed. Patient not taking: Reported on 11/05/2020 07/25/20   Evelina Bucy, DPM    Allergies    Erythromycin base, Mirtazapine, and Nsaids  Review of Systems   Review of Systems  Constitutional: Negative.   HENT: Negative.    Respiratory:  Positive for shortness of breath.   Cardiovascular: Negative.   Gastrointestinal: Negative.   Musculoskeletal:  Positive for myalgias.  Skin: Negative.   Neurological: Negative.    Physical Exam Updated Vital Signs BP 104/63   Pulse 66   Temp 98.3 F (36.8 C) (Oral)   Resp 16   SpO2 99%   Physical Exam Vitals and nursing note reviewed.  Constitutional:      Appearance: He is cachectic. He  is ill-appearing. He is not toxic-appearing.  HENT:     Head: Normocephalic and atraumatic.     Nose: Nose normal.     Mouth/Throat:     Mouth: Mucous membranes are moist.     Pharynx: Oropharynx is clear. Uvula midline. No oropharyngeal exudate or posterior oropharyngeal erythema.     Tonsils: No tonsillar exudate.  Eyes:     General: Lids are normal. Vision grossly intact.        Right eye: No discharge.        Left eye: No discharge.     Extraocular Movements: Extraocular movements intact.     Conjunctiva/sclera:  Conjunctivae normal.     Pupils: Pupils are equal, round, and reactive to light.  Neck:     Trachea: Trachea and phonation normal.  Cardiovascular:     Rate and Rhythm: Normal rate and regular rhythm.     Pulses: Normal pulses.     Heart sounds: Normal heart sounds.  Pulmonary:     Effort: Pulmonary effort is normal. No tachypnea, bradypnea, accessory muscle usage, prolonged expiration or respiratory distress.     Breath sounds: Decreased air movement present. Examination of the right-middle field reveals decreased breath sounds. Examination of the right-lower field reveals decreased breath sounds and rales. Decreased breath sounds and rales present. No wheezing.  Chest:     Chest wall: No mass, lacerations, deformity, swelling, tenderness, crepitus or edema.  Abdominal:     General: Bowel sounds are normal. There is no distension.     Palpations: Abdomen is soft.     Tenderness: There is no abdominal tenderness. There is no right CVA tenderness, left CVA tenderness, guarding or rebound.  Musculoskeletal:        General: No deformity.     Cervical back: Normal range of motion and neck supple. No edema, rigidity or crepitus. No pain with movement, spinous process tenderness or muscular tenderness.     Right lower leg: No edema.     Left lower leg: No edema.  Lymphadenopathy:     Cervical: No cervical adenopathy.  Skin:    General: Skin is warm and dry.     Capillary  Refill: Capillary refill takes less than 2 seconds.  Neurological:     General: No focal deficit present.     Mental Status: He is alert and oriented to person, place, and time. He is confused.     GCS: GCS eye subscore is 4. GCS verbal subscore is 5. GCS motor subscore is 6.     Sensory: Sensation is intact.     Motor: Motor function is intact.  Psychiatric:        Mood and Affect: Mood normal.    ED Results / Procedures / Treatments   Labs (all labs ordered are listed, but only abnormal results are displayed) Labs Reviewed  COMPREHENSIVE METABOLIC PANEL - Abnormal; Notable for the following components:      Result Value   Calcium 8.2 (*)    Total Protein 5.5 (*)    Albumin 1.7 (*)    All other components within normal limits  CBC WITH DIFFERENTIAL/PLATELET - Abnormal; Notable for the following components:   WBC 15.1 (*)    RBC 2.56 (*)    Hemoglobin 8.1 (*)    HCT 25.7 (*)    MCV 100.4 (*)    Neutro Abs 12.6 (*)    Abs Immature Granulocytes 0.26 (*)    All other components within normal limits  CULTURE, BLOOD (SINGLE)  LACTIC ACID, PLASMA  LACTIC ACID, PLASMA  PROTIME-INR  APTT  URINALYSIS, ROUTINE W REFLEX MICROSCOPIC    EKG None  Radiology DG Chest 2 View  Result Date: 11/05/2020 CLINICAL DATA:  Hypoxia.  [place] out of bed yesterday. EXAM: CHEST - 2 VIEW COMPARISON:  October 26, 2020 FINDINGS: Known pulmonary nodule in the left upper lobe. Emphysematous changes in the lungs. There is a left pleural effusion with underlying atelectasis. Infiltrate in the right lung persists, largely obscured by layering pleural effusion not seen previously. A right PICC line terminates in the central SVC. The cardiomediastinal silhouette is unremarkable. IMPRESSION: 1. There is a  new layering right pleural effusion which partially obscures the infiltrate in the right base consistent with pneumonia. 2. Small left effusion with underlying atelectasis. 3. Known nodule in the left upper lobe,  better seen on previous CT imaging. Electronically Signed   By: Dorise Bullion III M.D   On: 11/05/2020 14:26   DG Hand Complete Right  Result Date: 11/05/2020 CLINICAL DATA:  Pain in right thumb after rolling out of bed. EXAM: RIGHT HAND - COMPLETE 3+ VIEW COMPARISON:  None. FINDINGS: There is no evidence of fracture or dislocation. Moderate degenerative changes noted at the basilar joint. Soft tissues are unremarkable. IMPRESSION: No acute abnormality.  Basilar joint osteoarthritis. Electronically Signed   By: Kerby Moors M.D.   On: 11/05/2020 11:42    Procedures Procedures   Medications Ordered in ED Medications - No data to display  ED Course  I have reviewed the triage vital signs and the nursing notes.  Pertinent labs & imaging results that were available during my care of the patient were reviewed by me and considered in my medical decision making (see chart for details).    MDM Rules/Calculators/A&P                         70 year old male who presents with concern for right hand pain as well as worsening vital signs at his nursing facility.  Recent admission for pneumonia and suspected bacteremia.  Differential diagnosis is broad and includes but is limited to sepsis, metabolic derangement, dehydration, encephalitis, stroke.  Hypotensive on intake and 96/53, vital signs otherwise normal.  Cardiac exam is unremarkable, pulmonary exam with decreased breath sounds in the right lung base as well as rales.  Abdominal exam is benign.  Patient is overall cachectic.  Examination of the right hand is unremarkable.  CBC with leukocytosis of 15,000, increased from patient's recent white count of 11 at his facility on 7/27.  CMP unremarkable, lactic acid is normal.  Patient declining any further laboratory studies.  States  Chest x-ray with layering of right pleural effusion which per subscribers pneumonia in the right base.  Hand x-ray initially negative, after discussion with RN it  was revealed that there was a lunate dislocation on the wrist films at the facility.  Given new pleural effusion, hypotension, increasing oxygen requirement (requiring 4 L supplemental oxygen by nasal cannula at this time, when he requires 2 L at facility) and increasing leukocytosis, feel patient would benefit from mission to the hospital for further stabilization and management.  Case was discussed with hospitalist who is agreeable to seeing this patient and admitting him to his service.  I appreciate his collaboration in the care of this patient.  [place] voiced understanding with medical evaluation and treatment as far.  Each of his questions was answered to his expressed satisfaction.  While he is displeased with the plan for admission at this time, he is willing to be admitted to the hospital for further stabilization and management.  This chart was dictated using voice recognition software, Dragon. Despite the best efforts of this provider to proofread and correct errors, errors may still occur which can change documentation meaning.  Final Clinical Impression(s) / ED Diagnoses Final diagnoses:  None    Rx / DC Orders ED Discharge Orders     None        Aura Dials 11/06/20 1159    Daleen Bo, MD 11/06/20 [phone]

## 2020-11-05 NOTE — ED Notes (Signed)
Patient states he was trying to get something off of the floor and didn't realize how close to the edge pf the bed he was. Sounds like he caught himself with his right hand. C/O pain to the right thumb. Denies hitting head or any other injuty.

## 2020-11-05 NOTE — H&P (Signed)
History and Physical    [name] BTD:[id number] DOB: November 10, 1950 DOA: 11/05/2020  PCP: Maurice Small, MD   Patient coming from: [place] Chief Complaint: hurt wrist and noted to have low oxygen saturations  HPI: [name] is a 70 y.o. male with a history of COPD on inhalers, with a prolonged admission at [facility] after a fall from a ladder which was complicated by multiple rib fractures on the left side, pneumothorax and he states s/p chest tube for a week which was removed and since has been on 2L of oxygen . Also from fall had a severe Left open ?fracture wound which is healing okay now but has been placed on Vancomycin and didn't think he had recieved any vancomycin today.   He went to [facility] on 7/27 and "was doing better" and getting stronger then he unfortunately had a fall and hurt his Right wrist, X-ray was concerning for a lunate/perilunate dislocation of the wrist with no acute fracture visualized.  It feels like it is doing better and he was sent to the emergency department to get a brace. But in the ED he was noted to have some lower oxygen saturations and was felt to need oxygen.    It's odd because he states he hasn't noticed a change in his breathing and has felt stable and would like to get back to [facility] as soon as possible to keep rehabilitating.  He retired at 26  In the emergency department, having some softer pressures which she says this possibly unusual for him, HR 86, 96/53, 98.3, 98% on 4 L initially, NA 136, K3.7, creatinine 1, albumin low to 1.7, WBC 15.1, Hgb 8.1 CXR shows "1. There is a new layering right pleural effusion which partially obscures the infiltrate in the right base consistent with pneumonia. 2. Small left effusion with underlying atelectasis. 3. Known nodule in the left upper lobe, better seen on previous CT imaging.  " He was not started on antibiotics in the ED.  Review of Systems: As per HPI otherwise 10 point review of  systems negative.  Other pertinents as below:  General - has lost a fair amount of weight, feels weakish HEENT - denies any head trauma, new headaches or visual changes Cardio - denies CP, palpitations Resp - has a cough GI - denies n/v/d GU - denies new urinary concerns like dysuria or frequency MSK - joint pains are getting better. Skin - still some lacerations healing, denies new lacerations  Neuro - denies new numbness or weakness Psych -  denies new anxiety or depression  Past Medical History:  Diagnosis Date   Anxiety    Arthritis    Cataract    COPD (chronic obstructive pulmonary disease) ([place])    inhaler   Depression    Neuromuscular disorder ([place])    bulding disc    Past Surgical History:  Procedure Laterality Date   BACK SURGERY     LUMBAR DISC SURGERY Left 06/2003   L4 on L3 hemilaminectomy, foraminotomy for 3-4,4-5 with left L3-L4 extraforaminal diskectomy/notes 08/22/2010    REPAIR DURAL / CSF LEAK  07/2003   Archie Endo 08/22/2010     reports that he has been smoking cigarettes. He has a 64.50 pack-year smoking history. He has never used smokeless tobacco. He reports previous alcohol use of about 6.0 standard drinks of alcohol per week. He reports that he does not use drugs.  Allergies  Allergen Reactions   Erythromycin Base Other (See Comments)  hallucinations   Mirtazapine Other (See Comments)   Nsaids Other (See Comments)    Family History  Problem Relation Age of Onset   Diabetes Father    Colon polyps Father    Heart disease Father    Stroke Father    Heart failure Father    Diabetes Brother    Muscular dystrophy Mother    Diabetes Paternal Uncle    Colon cancer Paternal Grandfather    Diabetes Son      Prior to Admission medications   Medication Sig Start Date End Date Taking? Authorizing Provider  albuterol (PROVENTIL) (2.5 MG/3ML) 0.083% nebulizer solution Take 2.5 mg by nebulization every 2 (two) hours as needed for wheezing or shortness of  breath.   Yes [provider]  benzonatate (TESSALON) 100 MG capsule Take 100 mg by mouth every 8 (eight) hours as needed for cough.   Yes [provider]  OVER THE COUNTER MEDICATION Take 1 Bottle by mouth in the morning, at noon, and at bedtime. Magic cup   Yes [provider]  potassium chloride (KLOR-CON) 10 MEQ tablet Take 10 mEq by mouth daily.   Yes [provider]  promethazine (PHENERGAN) 25 MG tablet Take 25 mg by mouth every 8 (eight) hours as needed for nausea or vomiting.   Yes [provider]  traZODone (DESYREL) 50 MG tablet Take 50 mg by mouth at bedtime as needed for sleep. 02/13/20  Yes [provider]  vancomycin 750 mg in sodium chloride 0.9 % 250 mL Inject 750 mg into the vein 2 (two) times daily.   Yes [provider]  methylPREDNISolone (MEDROL DOSEPAK) 4 MG TBPK tablet 6 Day Taper Pack. Take as Directed. Patient not taking: Reported on 11/05/2020 07/25/20   Evelina Bucy, DPM    Physical Exam: Vitals:   11/05/20 1704 11/05/20 1856 11/05/20 2110 11/05/20 2155  BP: 110/65 101/61  115/73  Pulse: 75 68 61 71  Resp: '17 17 20 ' (!) 26  Temp:      TempSrc:      SpO2: 94% 99% 99% 97%    Constitutional: NAD, comfortable, thin, weak appearing Eyes: pupils equal and reactive to light, anicteric, without injection ENMT: MMM, throat without exudates or erythema Neck: normal, supple, no masses, no thyromegaly noted Respiratory: some cough, no significant increased work of breathing, finishing full sentences Cardiovascular: rrr w/o mrg, warm extremities Abdomen: NBS, NT,   Musculoskeletal: moving all 4 extremities, left arm healing large wound without evidence of infection Skin: no rashes, lesions, ulcers. No induration Neurologic: CN 2-12 grossly intact. Sensation intact Psychiatric: AO appearing, mentation appropriate   Labs on Admission: I have personally reviewed following labs and imaging  studies  CBC: Recent Labs  Lab 11/05/20 1403  WBC 15.1*  NEUTROABS 12.6*  HGB 8.1*  HCT 25.7*  MCV 100.4*  PLT 097   Basic Metabolic Panel: Recent Labs  Lab 11/05/20 1403  NA 136  K 3.7  CL 98  CO2 31  GLUCOSE 88  BUN 19  CREATININE 1.00  CALCIUM 8.2*   GFR: CrCl cannot be calculated (Unknown ideal weight.). Liver Function Tests: Recent Labs  Lab 11/05/20 1403  AST 16  ALT 17  ALKPHOS 59  BILITOT 0.7  PROT 5.5*  ALBUMIN 1.7*   No results for input(s): LIPASE, AMYLASE in the last 168 hours. No results for input(s): AMMONIA in the last 168 hours. Coagulation Profile: No results for input(s): INR, PROTIME in the last 168 hours. Cardiac  Enzymes: No results for input(s): CKTOTAL, CKMB, CKMBINDEX, TROPONINI in the last 168 hours. BNP (last 3 results) No results for input(s): PROBNP in the last 8760 hours. HbA1C: No results for input(s): HGBA1C in the last 72 hours. CBG: No results for input(s): GLUCAP in the last 168 hours. Lipid Profile: No results for input(s): CHOL, HDL, LDLCALC, TRIG, CHOLHDL, LDLDIRECT in the last 72 hours. Thyroid Function Tests: No results for input(s): TSH, T4TOTAL, FREET4, T3FREE, THYROIDAB in the last 72 hours. Anemia Panel: No results for input(s): VITAMINB12, FOLATE, FERRITIN, TIBC, IRON, RETICCTPCT in the last 72 hours. Urine analysis:    Component Value Date/Time   COLORURINE STRAW (A) 11/05/2020 1507   APPEARANCEUR CLEAR 11/05/2020 1507   LABSPEC 1.005 11/05/2020 1507   PHURINE 7.0 11/05/2020 1507   GLUCOSEU NEGATIVE 11/05/2020 1507   HGBUR NEGATIVE 11/05/2020 1507   BILIRUBINUR NEGATIVE 11/05/2020 1507   KETONESUR NEGATIVE 11/05/2020 1507   PROTEINUR NEGATIVE 11/05/2020 1507   UROBILINOGEN 0.2 04/12/2014 1240   NITRITE NEGATIVE 11/05/2020 1507   LEUKOCYTESUR NEGATIVE 11/05/2020 1507    Radiological Exams on Admission: DG Chest 2 View  Result Date: 11/05/2020 CLINICAL DATA:  Hypoxia.  [place] out of bed yesterday.  EXAM: CHEST - 2 VIEW COMPARISON:  October 26, 2020 FINDINGS: Known pulmonary nodule in the left upper lobe. Emphysematous changes in the lungs. There is a left pleural effusion with underlying atelectasis. Infiltrate in the right lung persists, largely obscured by layering pleural effusion not seen previously. A right PICC line terminates in the central SVC. The cardiomediastinal silhouette is unremarkable. IMPRESSION: 1. There is a new layering right pleural effusion which partially obscures the infiltrate in the right base consistent with pneumonia. 2. Small left effusion with underlying atelectasis. 3. Known nodule in the left upper lobe, better seen on previous CT imaging. Electronically Signed   By: Dorise Bullion III M.D   On: 11/05/2020 14:26   DG Wrist 2 Views Right  Result Date: 11/05/2020 CLINICAL DATA:  Fall yesterday. Right thumb pain since fall, x-ray at facility reported concern for dislocation. EXAM: RIGHT WRIST - 2 VIEW COMPARISON:  Hand radiograph earlier today. FINDINGS: No acute fracture. Distal radioulnar joint is intact. There is volar tilt of the lunate which can be seen in the setting of carpal instability. Slight widening of the scapholunate interval at 5 mm. 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 IMPRESSION: 1. No acute fracture of the right wrist. 2. Volar tilt of the lunate which can be seen with carpal instability with associated widening of the scapholunate interval. 3. 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 Electronically Signed   By: Keith Rake M.D.   On: 11/05/2020 22:01   DG Hand Complete Right  Result Date: 11/05/2020 CLINICAL DATA:  Pain in right thumb after rolling out of bed. EXAM: RIGHT HAND - COMPLETE 3+ VIEW COMPARISON:  None. FINDINGS: There is no evidence of fracture or dislocation. Moderate degenerative changes noted at the basilar joint. Soft tissues are unremarkable. IMPRESSION: No acute abnormality.  Basilar joint  osteoarthritis. Electronically Signed   By: Kerby Moors M.D.   On: 11/05/2020 11:42    EKG: Independently reviewed. Not STEMI, similar to 2020. NSR  Assessment/Plan Active Problems:   Acute on chronic respiratory failure with hypoxia (HCC)   Protein calorie malnutrition (HCC)   Closed fracture of multiple ribs of left side with routine healing   Laceration of arm, left, complicated, subsequent encounter   Traumatic pneumothorax and hemothorax, subsequent encounter  Vitamin B12 deficiency   TOBACCO ABUSE   COPD (chronic obstructive pulmonary disease) (HCC)   Anemia  Despite being med-recc'ed there were a couple medicines I continued from [facility] records that weren't necessarily on med rec.  Acute on Chronic Hypoxic Respiratory Failure, now improving but max of 4L from 2L, improving without escalation of antibiotics.  but given new opacity on RLL with wbc , #Previously multiple rib fractures, traumatic pneumothorax COPD - inhalers continued, IS/flutter valve, wean oxygen as ablve. improving. --encourage use of IS/flutter valve --ESR/CRP with procalcitonin to consider a baseline for treatment, perhaps compare to [facility] records. --will try for sputum culture,  will escalate with zosyn to cover HCAP --consider CT scan --telemetry  Prolonged need for vancomycin ?OM --Will get vanc random to see where levels are, consider re-loading to complete therapy.  Not sure when end date is.  I wonder if presumed Osteomyelitis, consider ID consult -has PICC line placed, help figure out maintenance orders please -Need to get records from [place] --holding fluconazole which was ordered at SNF, need to clarify why. And was ordered for 10 daily doses.  Right wrist "lunate/perilunate disolcation without obvious fracture as per picture/media I took.  I wonder if it was a subluxation and popped back into place. will repeat X-ray and consider ortho in the morning for definite  thoughts/need for a cast/brace  Anemia - 8.1 added on ferritin/iron/tibc, probably avoid iron infusion with concerns for infection, ctm, no signs of bleeding. After prolonged hospitalization. **with possibly big drop in hemoglobin and new R pleural effusion and possibly left was "hemorrhagic" will hold on dvt ppx until get records and monitor stability of hgb --repeating cbc '@10pm'  B12 deficiency - start empiric oral b12, recheck b12  Chronic pain - cont ~oxycodone prn, tylenol prn, gabapentin 356m TID  Poor mobility - PT consult Malnutrition - cachectic appearing, weak appearing, encourage good nutrition, consider boosts, albumin 1.7  Mood - cont citalopram as per [facility] records  Pulm  nodule follow up outpatient  Patient and/or Family completely agreed with the plan, expressed understanding and I answered all questions.  DVT prophylaxis: None:concern for big hgb drop Code Status: Full code despite having a DNR, he said he would like to be full code since [place] [facility] [place] [place] Family Communication: n/a Disposition Plan: hope back to [facility] where he likes to continue rehabilitation Consults called: PT Admission status: observation for now but with HCAP new opacity, can probably be switched to Inpatient.    A total of 80 minutes utilized during this admission.  [facility]   If 7PM-7AM, please contact night-coverage [email or web address] Password [email or web address] 11/05/2020, 10:05 PM

## 2020-11-05 NOTE — ED Notes (Signed)
Patient confused at baseline from a nursing facility. Patient is currently refusing further blood work and states he is ready to go. MD made aware and to speak with patient

## 2020-11-06 ENCOUNTER — Encounter (HOSPITAL_COMMUNITY): Payer: Self-pay | Admitting: Internal Medicine

## 2020-11-06 DIAGNOSIS — B370 Candidal stomatitis: Secondary | ICD-10-CM

## 2020-11-06 DIAGNOSIS — J189 Pneumonia, unspecified organism: Secondary | ICD-10-CM

## 2020-11-06 LAB — HEMOGLOBIN AND HEMATOCRIT, BLOOD
HCT: 24.9 % — ABNORMAL LOW (ref 39.0–52.0)
Hemoglobin: 8.3 g/dL — ABNORMAL LOW (ref 13.0–17.0)

## 2020-11-06 LAB — HIV ANTIBODY (ROUTINE TESTING W REFLEX): HIV Screen 4th Generation wRfx: NONREACTIVE

## 2020-11-06 MED ORDER — FLUTICASONE FUROATE-VILANTEROL 200-25 MCG/INH IN AEPB
1.0000 | INHALATION_SPRAY | Freq: Every day | RESPIRATORY_TRACT | Status: DC
  Administered 2020-11-08: 1 via RESPIRATORY_TRACT
  Filled 2020-11-06: qty 28

## 2020-11-06 MED ORDER — ALBUTEROL SULFATE (2.5 MG/3ML) 0.083% IN NEBU: 2.5000 mg | INHALATION_SOLUTION | Freq: Four times a day (QID) | RESPIRATORY_TRACT | Status: DC | PRN

## 2020-11-06 MED ORDER — POTASSIUM CHLORIDE ER 10 MEQ PO TBCR
10.0000 meq | EXTENDED_RELEASE_TABLET | Freq: Every day | ORAL | Status: DC
  Administered 2020-11-06 – 2020-11-08 (×3): 10 meq via ORAL
  Filled 2020-11-06 (×5): qty 1

## 2020-11-06 MED ORDER — CLONAZEPAM 1 MG PO TABS: 1.0000 mg | ORAL_TABLET | Freq: Two times a day (BID) | ORAL | 0 refills | Status: DC

## 2020-11-06 MED ORDER — MAGNESIUM OXIDE -MG SUPPLEMENT 400 (240 MG) MG PO TABS
400.0000 mg | ORAL_TABLET | Freq: Every day | ORAL | Status: DC
  Administered 2020-11-06 – 2020-11-08 (×3): 400 mg via ORAL
  Filled 2020-11-06 (×3): qty 1

## 2020-11-06 MED ORDER — GABAPENTIN 300 MG PO CAPS
300.0000 mg | ORAL_CAPSULE | Freq: Three times a day (TID) | ORAL | Status: DC
  Administered 2020-11-06 – 2020-11-08 (×8): 300 mg via ORAL
  Filled 2020-11-06 (×8): qty 1

## 2020-11-06 MED ORDER — TRAZODONE HCL 50 MG PO TABS
50.0000 mg | ORAL_TABLET | Freq: Every evening | ORAL | Status: DC | PRN
  Administered 2020-11-06: 50 mg via ORAL
  Filled 2020-11-06: qty 1

## 2020-11-06 MED ORDER — ACETAMINOPHEN 325 MG PO TABS
650.0000 mg | ORAL_TABLET | Freq: Four times a day (QID) | ORAL | Status: DC | PRN
  Administered 2020-11-07 – 2020-11-08 (×3): 650 mg via ORAL
  Filled 2020-11-06 (×3): qty 2

## 2020-11-06 MED ORDER — CLONAZEPAM 0.5 MG PO TABS
0.5000 mg | ORAL_TABLET | Freq: Two times a day (BID) | ORAL | Status: DC | PRN
  Administered 2020-11-07 – 2020-11-08 (×2): 0.5 mg via ORAL
  Filled 2020-11-06 (×2): qty 1

## 2020-11-06 MED ORDER — NICOTINE 21 MG/24HR TD PT24
21.0000 mg | MEDICATED_PATCH | Freq: Every day | TRANSDERMAL | Status: DC
  Administered 2020-11-06 – 2020-11-08 (×3): 21 mg via TRANSDERMAL
  Filled 2020-11-06 (×3): qty 1

## 2020-11-06 MED ORDER — CITALOPRAM HYDROBROMIDE 20 MG PO TABS
40.0000 mg | ORAL_TABLET | Freq: Every day | ORAL | Status: DC
  Administered 2020-11-06 – 2020-11-08 (×3): 40 mg via ORAL
  Filled 2020-11-06 (×2): qty 2
  Filled 2020-11-06: qty 4

## 2020-11-06 MED ORDER — LEVOFLOXACIN 750 MG PO TABS: 750.0000 mg | ORAL_TABLET | Freq: Every day | ORAL | 0 refills | Status: DC

## 2020-11-06 NOTE — ED Notes (Signed)
Notified by DO that patient will not be able to return to facility under tomorrow. Cancelled [facility] transport. Patient notified.

## 2020-11-06 NOTE — Discharge Summary (Addendum)
Physician Discharge Summary  [name] [id number] DOB: 1951-01-13 DOA: 11/05/2020  PCP: Tyler Small, MD  Admit date: 11/05/2020 Discharge date: 11/07/2020  Admitted From: SNF Disposition:  SNF  Recommendations for Outpatient Follow-up:  Follow up with PCP in 1 week Follow up on blood culture results obtained 7/31 - negative to date  Repeat CBC to ensure resolution and improvement of WBC   Discharge Condition: Stable CODE STATUS: Full (changed from DNR prior to admission)  Diet recommendation: Regular   Brief/Interim Summary: From H&P by Dr. Francesco Sor: "HPI: [name] is a 70 y.o. male with a history of COPD on inhalers, with a prolonged admission at [facility] after a fall from a ladder which was complicated by multiple rib fractures on the left side, pneumothorax and he states s/p chest tube for a week which was removed and since has been on 2L of oxygen . Also from fall had a severe Left open ?fracture wound which is healing okay now but has been placed on Vancomycin and didn't think he had recieved any vancomycin today.   He went to [facility] on 7/27 and "was doing better" and getting stronger then he unfortunately had a fall and hurt his Right wrist, X-ray was concerning for a lunate/perilunate dislocation of the wrist with no acute fracture visualized.  It feels like it is doing better and he was sent to the emergency department to get a brace. But in the ED he was noted to have some lower oxygen saturations and was felt to need oxygen.     It's odd because he states he hasn't noticed a change in his breathing and has felt stable and would like to get back to [facility] as soon as possible to keep rehabilitating.   He retired at 29   In the emergency department, having some softer pressures which she says this possibly unusual for him, HR 86, 96/53, 98.3, 98% on 4 L initially, NA 136, K3.7, creatinine 1, albumin low to 1.7, WBC 15.1, Hgb 8.1 CXR shows "1. There is a new  layering right pleural effusion which partially obscures the infiltrate in the right base consistent with pneumonia. 2. [name] left effusion with underlying atelectasis. 3. Known nodule in the left upper lobe, better seen on previous CT imaging."  Interim: Records from [facility] was reviewed.  Patient was admitted on 10/26/2020 for shortness of breath, was diagnosed with right-sided HCAP, chronic hypoxemic respiratory failure, multiple rib fractures.  He was treated with IV vancomycin, IV cefepime, IV doxycycline at the time.  He chronically requires 3 L nasal cannula O2.  During the hospital stay, patient developed MRSA bacteremia, echocardiogram was negative for endocarditis.  He was discharged on 2-week course of IV vancomycin, PICC line inserted 7/25, last day would be 8/3.  Repeat blood culture 7/23 were negative.  He was discharged on 7/26. Previous to that hospitalization, patient was admitted for fall from ladder resulting in left-sided pneumothorax with multiple rib fractures, required chest tube placement.   Subjective on day of discharge: Patient states that he is feeling well, he is not even sure why he was admitted to begin with.  He states that he fell off the bed at his rehab facility because he was not aware that the bed rail was down.  He was trying to get the urinal at the time.  He landed on his right fist.  He has no pain on his right hand or wrist, no strength loss.  He states that  when EMS was called at SNF, he did not even want to come to the hospital, states that he was feeling fine.  However, he had low oxygen saturation and was transferred to [facility].  On examination, patient has no complaints of shortness of breath or cough other than his baseline.  He was afebrile.  He was recently hospitalized for HCAP as above.  He was initially on 4 L nasal cannula oxygen during my examination, this was weaned to 2 L without significant desaturation.  Patient had no  conversational dyspnea.  Patient wanted to go back to rehab, get stronger, and eventually go home.  Discharge Diagnoses:  Principal Problem:   Acute on chronic respiratory failure with hypoxia (HCC) Active Problems:   TOBACCO ABUSE   COPD (chronic obstructive pulmonary disease) (HCC)   Chronic pain syndrome   Protein calorie malnutrition (HCC)   Vitamin B12 deficiency   Closed fracture of multiple ribs of left side with routine healing   Laceration of arm, left, complicated, subsequent encounter   Traumatic pneumothorax and hemothorax, subsequent encounter   Anemia of chronic disease   Thrush   HCAP (healthcare-associated pneumonia)   Discharge Instructions  Discharge Instructions     Increase activity slowly   Complete by: As directed       Allergies as of 11/06/2020       Reactions   Erythromycin Base Other (See Comments)   hallucinations   Mirtazapine Other (See Comments)   Nsaids Other (See Comments)        Medication List     TAKE these medications    albuterol (2.5 MG/3ML) 0.083% nebulizer solution Commonly known as: PROVENTIL Take 2.5 mg by nebulization every 2 (two) hours as needed for wheezing or shortness of breath.   citalopram 40 MG tablet Commonly known as: CELEXA Take 40 mg by mouth daily.   clonazePAM 1 MG tablet Commonly known as: KLONOPIN Take 1 tablet (1 mg total) by mouth 2 (two) times daily.   fluticasone-salmeterol 500-50 MCG/ACT Aepb Commonly known as: ADVAIR Inhale 1 puff into the lungs in the morning and at bedtime.   gabapentin 300 MG capsule Commonly known as: NEURONTIN Take 300 mg by mouth 3 (three) times daily.   lactose free nutrition Liqd Take 237 mLs by mouth 3 (three) times daily between meals.   Nutritional Supplement Liqd Take by mouth 3 (three) times daily with meals. Magic Cup   levofloxacin 750 MG tablet Commonly known as: Levaquin Take 1 tablet (750 mg total) by mouth daily for 5 days.   magic mouthwash  w/lidocaine Soln Take 5 mLs by mouth 4 (four) times daily. Swish and swallow   magnesium oxide 400 MG tablet Commonly known as: MAG-OX Take 400 mg by mouth daily.   nicotine 21 mg/24hr patch Commonly known as: NICODERM CQ - dosed in mg/24 hours Place 21 mg onto the skin every morning.   potassium chloride 10 MEQ tablet Commonly known as: KLOR-CON Take 10 mEq by mouth daily.   promethazine 25 MG tablet Commonly known as: PHENERGAN Take 25 mg by mouth every 8 (eight) hours as needed for nausea or vomiting.   traZODone 50 MG tablet Commonly known as: DESYREL Take 50 mg by mouth at bedtime as needed for sleep.   vancomycin 750 mg in sodium chloride 0.9 % 250 mL Inject 750 mg into the vein 2 (two) times daily.        Follow-up Information     Tyler Small, MD Follow  up.   Specialty: Family Medicine Contact information: [place] [address] [phone]                Allergies  Allergen Reactions   Erythromycin Base Other (See Comments)    hallucinations   Mirtazapine Other (See Comments)   Nsaids Other (See Comments)    Consultations: None    Procedures/Studies: DG Chest 2 View  Result Date: 11/05/2020 CLINICAL DATA:  Hypoxia.  [place] out of bed yesterday. EXAM: CHEST - 2 VIEW COMPARISON:  October 26, 2020 FINDINGS: Known pulmonary nodule in the left upper lobe. Emphysematous changes in the lungs. There is a left pleural effusion with underlying atelectasis. Infiltrate in the right lung persists, largely obscured by layering pleural effusion not seen previously. A right PICC line terminates in the central SVC. The cardiomediastinal silhouette is unremarkable. IMPRESSION: 1. There is a new layering right pleural effusion which partially obscures the infiltrate in the right base consistent with pneumonia. 2. [name] left effusion with underlying atelectasis. 3. Known nodule in the left upper lobe, better seen on previous CT imaging.  Electronically Signed   By: Dorise Bullion III M.D   On: 11/05/2020 14:26   DG Wrist 2 Views Right  Result Date: 11/05/2020 CLINICAL DATA:  Fall yesterday. Right thumb pain since fall, x-ray at facility reported concern for dislocation. EXAM: RIGHT WRIST - 2 VIEW COMPARISON:  Hand radiograph earlier today. FINDINGS: No acute fracture. Distal radioulnar joint is intact. There is volar tilt of the lunate which can be seen in the setting of carpal instability. Slight widening of the scapholunate interval at 5 mm. 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 IMPRESSION: 1. No acute fracture of the right wrist. 2. Volar tilt of the lunate which can be seen with carpal instability with associated widening of the scapholunate interval. 3. 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 Electronically Signed   By: Keith Rake M.D.   On: 11/05/2020 22:01   DG Hand Complete Right  Result Date: 11/05/2020 CLINICAL DATA:  Pain in right thumb after rolling out of bed. EXAM: RIGHT HAND - COMPLETE 3+ VIEW COMPARISON:  None. FINDINGS: There is no evidence of fracture or dislocation. Moderate degenerative changes noted at the basilar joint. Soft tissues are unremarkable. IMPRESSION: No acute abnormality.  Basilar joint osteoarthritis. Electronically Signed   By: Kerby Moors M.D.   On: 11/05/2020 11:42       Discharge Exam: Vitals:   11/06/20 1000 11/06/20 1130  BP: (!) 86/56 (!) 98/49  Pulse: 71 76  Resp: 17 13  Temp:    SpO2: 100% 93%    General: Pt is alert, awake, not in acute distress Cardiovascular: RRR, S1/S2 +, no edema Respiratory: CTA bilaterally, no wheezing, no rhonchi, no respiratory distress, no conversational dyspnea, on baseline 2-3L O2 without distress on exam  Abdominal: Soft, NT, ND, bowel sounds + Extremities: no edema, no cyanosis Psych: Normal mood and affect, stable judgement and insight     The results of significant diagnostics from this  hospitalization (including imaging, microbiology, ancillary and laboratory) are listed below for reference.     Microbiology: Recent Results (from the past 240 hour(s))  Resp Panel by RT-PCR (Flu A&B, Covid) Nasopharyngeal Swab     Status: None   Collection Time: 11/05/20  9:37 PM   Specimen: Nasopharyngeal Swab; Nasopharyngeal(NP) swabs in vial transport medium  Result Value Ref Range Status   SARS Coronavirus 2 by RT PCR  NEGATIVE NEGATIVE Final    Comment: (NOTE) SARS-CoV-2 target nucleic acids are NOT DETECTED.  The SARS-CoV-2 RNA is generally detectable in upper respiratory specimens during the acute phase of infection. The lowest concentration of SARS-CoV-2 viral copies this assay can detect is 138 copies/mL. A negative result does not preclude SARS-Cov-2 infection and should not be used as the sole basis for treatment or other patient management decisions. A negative result may occur with  improper specimen collection/handling, submission of specimen other than nasopharyngeal swab, presence of viral mutation(s) within the areas targeted by this assay, and inadequate number of viral copies(<138 copies/mL). A negative result must be combined with clinical observations, patient history, and epidemiological information. The expected result is Negative.  Fact Sheet for Patients:  EntrepreneurPulse.com.au  Fact Sheet for Healthcare Providers:  IncredibleEmployment.be  This test is no t yet approved or cleared by the [place] FDA and  has been authorized for detection and/or diagnosis of SARS-CoV-2 by FDA under an Emergency Use Authorization (EUA). This EUA will remain  in effect (meaning this test can be used) for the duration of the COVID-19 declaration under Section 564(b)(1) of the Act, 21 U.S.C.section 360bbb-3(b)(1), unless the authorization is terminated  or revoked sooner.       Influenza A by PCR NEGATIVE NEGATIVE Final    Influenza B by PCR NEGATIVE NEGATIVE Final    Comment: (NOTE) The Xpert Xpress SARS-CoV-2/FLU/RSV plus assay is intended as an aid in the diagnosis of influenza from Nasopharyngeal swab specimens and should not be used as a sole basis for treatment. Nasal washings and aspirates are unacceptable for Xpert Xpress SARS-CoV-2/FLU/RSV testing.  Fact Sheet for Patients: EntrepreneurPulse.com.au  Fact Sheet for Healthcare Providers: IncredibleEmployment.be  This test is not yet approved or cleared by the [place] FDA and has been authorized for detection and/or diagnosis of SARS-CoV-2 by FDA under an Emergency Use Authorization (EUA). This EUA will remain in effect (meaning this test can be used) for the duration of the COVID-19 declaration under Section 564(b)(1) of the Act, 21 U.S.C. section 360bbb-3(b)(1), unless the authorization is terminated or revoked.  Performed at [facility], [place] [address], [place], [place] [address]      Labs: BNP (last 3 results) No results for input(s): BNP in the last 8760 hours. Basic Metabolic Panel: Recent Labs  Lab 11/05/20 1403  NA 136  K 3.7  CL 98  CO2 31  GLUCOSE 88  BUN 19  CREATININE 1.00  CALCIUM 8.2*   Liver Function Tests: Recent Labs  Lab 11/05/20 1403  AST 16  ALT 17  ALKPHOS 59  BILITOT 0.7  PROT 5.5*  ALBUMIN 1.7*   No results for input(s): LIPASE, AMYLASE in the last 168 hours. No results for input(s): AMMONIA in the last 168 hours. CBC: Recent Labs  Lab 11/05/20 1403 11/05/20 2212 11/05/20 2320  WBC 15.1* 17.5*  --   NEUTROABS 12.6*  --   --   HGB 8.1* 5.9* 8.3*  HCT 25.7* 18.0* 24.9*  MCV 100.4* 98.9  --   PLT 345 442*  --    Cardiac Enzymes: No results for input(s): CKTOTAL, CKMB, CKMBINDEX, TROPONINI in the last 168 hours. BNP: Invalid input(s): POCBNP CBG: No results for input(s): GLUCAP in the last 168 hours. D-Dimer No results for  input(s): DDIMER in the last 72 hours. Hgb A1c No results for input(s): HGBA1C in the last 72 hours. Lipid Profile No results for input(s): CHOL, HDL, LDLCALC, TRIG, CHOLHDL, LDLDIRECT  in the last 72 hours. Thyroid function studies No results for input(s): TSH, T4TOTAL, T3FREE, THYROIDAB in the last 72 hours.  Invalid input(s): FREET3 Anemia work up Recent Labs    11/05/20 2137 11/05/20 2221  VITAMINB12  --  547  FERRITIN 394*  --   TIBC 135*  --   IRON 12*  --    Urinalysis    Component Value Date/Time   COLORURINE STRAW (A) 11/05/2020 1507   APPEARANCEUR CLEAR 11/05/2020 1507   LABSPEC 1.005 11/05/2020 1507   PHURINE 7.0 11/05/2020 1507   GLUCOSEU NEGATIVE 11/05/2020 1507   HGBUR NEGATIVE 11/05/2020 1507   BILIRUBINUR NEGATIVE 11/05/2020 1507   KETONESUR NEGATIVE 11/05/2020 1507   PROTEINUR NEGATIVE 11/05/2020 1507   UROBILINOGEN 0.2 04/12/2014 1240   NITRITE NEGATIVE 11/05/2020 1507   LEUKOCYTESUR NEGATIVE 11/05/2020 1507   Sepsis Labs Invalid input(s): PROCALCITONIN,  WBC,  LACTICIDVEN Microbiology Recent Results (from the past 240 hour(s))  Resp Panel by RT-PCR (Flu A&B, Covid) Nasopharyngeal Swab     Status: None   Collection Time: 11/05/20  9:37 PM   Specimen: Nasopharyngeal Swab; Nasopharyngeal(NP) swabs in vial transport medium  Result Value Ref Range Status   SARS Coronavirus 2 by RT PCR NEGATIVE NEGATIVE Final    Comment: (NOTE) SARS-CoV-2 target nucleic acids are NOT DETECTED.  The SARS-CoV-2 RNA is generally detectable in upper respiratory specimens during the acute phase of infection. The lowest concentration of SARS-CoV-2 viral copies this assay can detect is 138 copies/mL. A negative result does not preclude SARS-Cov-2 infection and should not be used as the sole basis for treatment or other patient management decisions. A negative result may occur with  improper specimen collection/handling, submission of specimen other than nasopharyngeal swab,  presence of viral mutation(s) within the areas targeted by this assay, and inadequate number of viral copies(<138 copies/mL). A negative result must be combined with clinical observations, patient history, and epidemiological information. The expected result is Negative.  Fact Sheet for Patients:  EntrepreneurPulse.com.au  Fact Sheet for Healthcare Providers:  IncredibleEmployment.be  This test is no t yet approved or cleared by the [place] FDA and  has been authorized for detection and/or diagnosis of SARS-CoV-2 by FDA under an Emergency Use Authorization (EUA). This EUA will remain  in effect (meaning this test can be used) for the duration of the COVID-19 declaration under Section 564(b)(1) of the Act, 21 U.S.C.section 360bbb-3(b)(1), unless the authorization is terminated  or revoked sooner.       Influenza A by PCR NEGATIVE NEGATIVE Final   Influenza B by PCR NEGATIVE NEGATIVE Final    Comment: (NOTE) The Xpert Xpress SARS-CoV-2/FLU/RSV plus assay is intended as an aid in the diagnosis of influenza from Nasopharyngeal swab specimens and should not be used as a sole basis for treatment. Nasal washings and aspirates are unacceptable for Xpert Xpress SARS-CoV-2/FLU/RSV testing.  Fact Sheet for Patients: EntrepreneurPulse.com.au  Fact Sheet for Healthcare Providers: IncredibleEmployment.be  This test is not yet approved or cleared by the [place] FDA and has been authorized for detection and/or diagnosis of SARS-CoV-2 by FDA under an Emergency Use Authorization (EUA). This EUA will remain in effect (meaning this test can be used) for the duration of the COVID-19 declaration under Section 564(b)(1) of the Act, 21 U.S.C. section 360bbb-3(b)(1), unless the authorization is terminated or revoked.  Performed at [facility], [place] [address], [place], [place] [address]       Patient was seen and examined on the  day of discharge and was found to be in stable condition. Time coordinating discharge: 45 minutes including assessment and coordination of care, as well as examination of the patient.   SIGNED:  Dessa Phi, DO [facility] 11/06/2020, 11:56 AM

## 2020-11-06 NOTE — [place] FL2 (Signed)
[place] MEDICAID FL2 LEVEL OF CARE SCREENING TOOL     IDENTIFICATION  Patient Name: [name] Birthdate: 30-Jul-1950 Sex: male Admission Date (Current Location): 11/05/2020  [facility] and [place] Number:  Herbalist and Address:  [facility] [place], [place] [address] [place], [place] [address]      Provider Number: [id number]  Attending Physician Name and Address:  Dessa Phi, DO  Relative Name and Phone Number:       Current Level of Care: Hospital Recommended Level of Care: [place] Prior Approval Number:    Date Approved/Denied:   PASRR Number: [id number]  Discharge Plan: SNF    Current Diagnoses: Patient Active Problem List   Diagnosis Date Noted   Thrush 11/06/2020   HCAP (healthcare-associated pneumonia) 11/06/2020   Acute on chronic respiratory failure with hypoxia ([place]) 11/05/2020   Anemia of chronic disease 11/05/2020   Closed fracture of multiple ribs of left side with routine healing 10/16/2020   Laceration of arm, left, complicated, subsequent encounter 10/16/2020   Traumatic pneumothorax and hemothorax, subsequent encounter 10/16/2020   Abnormal gait 05/19/2020   Anxiety 05/19/2020   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   Chronic fatigue syndrome 05/19/2020   Chronic pain syndrome 05/19/2020   Dilatation of aorta ([place]) 05/19/2020   Frail elderly 05/19/2020   Hypothyroidism 05/19/2020   Insomnia 05/19/2020   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   Malnutrition of moderate degree ([place]: 60% to less than 75% of standard weight) ([place]) 05/19/2020   Mixed hyperlipidemia 05/19/2020   Prediabetes 05/19/2020   Protein calorie malnutrition ([place]) 05/19/2020   Recurrent major depression in remission ([place]) 05/19/2020   Tobacco dependence 05/19/2020   Vitamin B12 deficiency 05/19/2020   Sensory disturbance 04/13/2014   Epigastric pain    Orthostasis    COPD exacerbation ([place]) 04/12/2014   Chest pain 04/12/2014   Numbness  and tingling 04/12/2014   CHEST PAIN 06/27/2008   ABNORMAL FINDINGS GI TRACT 06/27/2008   COLONIC POLYPS, HX OF 06/27/2008   WEIGHT LOSS 05/23/2008   EPIGASTRIC PAIN 05/23/2008   NONSPEC ABN FINDNG RAD & OTH EXAM ABDOMINAL AREA 05/23/2008   TOBACCO ABUSE 03/30/2007   EMPHYSEMA 03/30/2007   ASTHMA 03/30/2007   COPD (chronic obstructive pulmonary disease) ([place]) 03/30/2007   OSTEOARTHRITIS 03/30/2007   ARTHRITIS 03/30/2007   DEGENERATIVE DISC DISEASE, 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    Orientation RESPIRATION BLADDER Height & Weight     Self, Time, Situation, Place  Normal, O2 (2L) Continent Weight:   Height:     BEHAVIORAL SYMPTOMS/MOOD NEUROLOGICAL BOWEL NUTRITION STATUS      Continent Diet (Normal)  AMBULATORY STATUS COMMUNICATION OF NEEDS Skin   Limited Assist Verbally Normal                       Personal Care Assistance Level of Assistance  Bathing, Dressing, Feeding Bathing Assistance: Independent Feeding assistance: Independent Dressing Assistance: Limited assistance     Functional Limitations Info  Sight, Speech, Hearing Sight Info: Impaired (Glasses) Hearing Info: Adequate Speech Info: Adequate    SPECIAL CARE FACTORS FREQUENCY  PT (By licensed PT), OT (By licensed OT)     PT Frequency: 5x weekly OT Frequency: 5x weekly            Contractures Contractures Info: Not present    Additional Factors Info  Code Status, Allergies Code Status Info: Full Allergies Info: Erythromycin, Mirtazapine, Nsaids           Current Medications (11/06/2020):  This is the current hospital active medication list Current Facility-Administered Medications  Medication Dose Route Frequency Provider Last Rate Last Admin   0.9 %  sodium chloride infusion (Manually program via Guardrails IV Fluids)   Intravenous Once Lang Snow, NP       0.9 %  sodium chloride infusion  500 mL Intravenous Continuous Ladene Artist, MD       acetaminophen (TYLENOL) tablet 650 mg   650 mg Oral Q6H PRN Sueanne Margarita, DO       albuterol (PROVENTIL) (2.5 MG/3ML) 0.083% nebulizer solution 2.5 mg  2.5 mg Nebulization Q6H PRN Sueanne Margarita, DO       citalopram (CELEXA) tablet 40 mg  40 mg Oral Daily Sueanne Margarita, DO   40 mg at 11/06/20 [id number]   clonazePAM (KLONOPIN) tablet 0.5 mg  0.5 mg Oral BID PRN Sueanne Margarita, DO       fluticasone furoate-vilanterol (BREO ELLIPTA) 200-25 MCG/INH 1 puff  1 puff Inhalation Daily Sueanne Margarita, DO       gabapentin (NEURONTIN) capsule 300 mg  300 mg Oral TID Sueanne Margarita, DO   300 mg at 11/06/20 [id number]   magnesium oxide (MAG-OX) tablet 400 mg  400 mg Oral Daily Sueanne Margarita, DO   400 mg at 11/06/20 [id number]   nicotine (NICODERM CQ - dosed in mg/24 hours) patch 21 mg  21 mg Transdermal Daily Sueanne Margarita, DO   21 mg at 11/06/20 [id number]   oxyCODONE (Oxy IR/ROXICODONE) immediate release tablet 2.5-5 mg  2.5-5 mg Oral Q4H PRN Sueanne Margarita, DO   5 mg at 11/06/20 0149   piperacillin-tazobactam (ZOSYN) IVPB 3.375 g  3.375 g Intravenous Q8H Angela Adam, [facility]   Stopped at 11/06/20 1028   potassium chloride (KLOR-CON) CR tablet 10 mEq  10 mEq Oral Daily Sueanne Margarita, DO   10 mEq at 11/06/20 0941   traZODone (DESYREL) tablet 50 mg  50 mg Oral QHS PRN Sueanne Margarita, DO       vancomycin (VANCOREADY) IVPB 750 mg/150 mL  750 mg Intravenous Q12H Angela Adam, [facility]   Stopped at 11/06/20 1307   vitamin B-12 (CYANOCOBALAMIN) tablet 1,000 mcg  1,000 mcg Oral Daily Sueanne Margarita, DO   1,000 mcg at 11/06/20 0940   Current Outpatient Medications  Medication Sig Dispense Refill   citalopram (CELEXA) 40 MG tablet Take 40 mg by mouth daily.     fluticasone-salmeterol (ADVAIR) 500-50 MCG/ACT AEPB Inhale 1 puff into the lungs in the morning and at bedtime.     gabapentin (NEURONTIN) 300 MG capsule Take 300 mg by mouth 3 (three) times daily.     lactose free nutrition (BOOST) LIQD Take 237 mLs by mouth 3 (three) times daily between meals.     levofloxacin  (LEVAQUIN) 750 MG tablet Take 1 tablet (750 mg total) by mouth daily for 5 days. 5 tablet 0   magic mouthwash w/lidocaine SOLN Take 5 mLs by mouth 4 (four) times daily. Swish and swallow     magnesium oxide (MAG-OX) 400 MG tablet Take 400 mg by mouth daily.     nicotine (NICODERM CQ - DOSED IN MG/24 HOURS) 21 mg/24hr patch Place 21 mg onto the skin every morning.     Nutritional Supplement LIQD Take by mouth 3 (three) times daily with meals. Magic Cup     potassium chloride (KLOR-CON) 10 MEQ tablet Take 10 mEq by mouth daily.     promethazine (PHENERGAN) 25 MG tablet Take 25 mg by mouth  every 8 (eight) hours as needed for nausea or vomiting.     traZODone (DESYREL) 50 MG tablet Take 50 mg by mouth at bedtime as needed for sleep.     vancomycin 750 mg in sodium chloride 0.9 % 250 mL Inject 750 mg into the vein 2 (two) times daily.     albuterol (PROVENTIL) (2.5 MG/3ML) 0.083% nebulizer solution Take 2.5 mg by nebulization every 2 (two) hours as needed for wheezing or shortness of breath.     clonazePAM (KLONOPIN) 1 MG tablet Take 1 tablet (1 mg total) by mouth 2 (two) times daily. 30 tablet 0     Discharge Medications: Please see discharge summary for a list of discharge medications.  Relevant Imaging Results:  Relevant Lab Results:   Additional Information Madilyn Fireman, MSW, LCSW SSN: [id number]  Archie Endo, LCSW

## 2020-11-06 NOTE — Evaluation (Signed)
Physical Therapy Evaluation Patient Details Name: [name] MRN: [id number] DOB: June 24, 1950 Today's Date: 11/06/2020   History of Present Illness  70 y.o. male admitted from SNF with a fall, hypoxia, R wrist injury (imaging showed lunate dislocation). Pt with a history of COPD on inhalers, with a prolonged admission at [facility] after a fall from a ladder 2 months ago (per pt) which was complicated by multiple rib fractures on the left side, pneumothorax and he states s/p chest tube for a week which was removed and since has been on 2L of oxygen .  Clinical Impression  Pt admitted with above diagnosis. Pt ambulated 69' with RW, no loss of balance, SaO2 94% on room air. Orthostatic vitals: BP supine 86/60, sitting 84/58, standing 85/60 -pt denied dizziness in all positions.  Pt currently with functional limitations due to the deficits listed below (see PT Problem List). Pt will benefit from skilled PT to increase their independence and safety with mobility to allow discharge to the venue listed below.       Follow Up Recommendations SNF    Equipment Recommendations  None recommended by PT    Recommendations for Other Services       Precautions / Restrictions Precautions Precautions: Fall Restrictions Weight Bearing Restrictions: No      Mobility  Bed Mobility Overal bed mobility: Modified Independent             General bed mobility comments: HOB up 25*    Transfers Overall transfer level: Needs assistance Equipment used: Rolling walker (2 wheeled) Transfers: Sit to/from Stand Sit to Stand: Supervision         General transfer comment: supervision for safety, no physical assist needed  Ambulation/Gait Ambulation/Gait assistance: Supervision Gait Distance (Feet): 80 Feet Assistive device: Rolling walker (2 wheeled) Gait Pattern/deviations: Step-through pattern;Decreased stride length Gait velocity: WFL   General Gait Details: steady, no loss of  balance, SaO2 94% on RA  Stairs            Wheelchair Mobility    Modified Rankin (Stroke Patients Only)       Balance Overall balance assessment: Modified Independent                                           Pertinent Vitals/Pain Pain Assessment: 0-10 Pain Score: 7  Pain Location: L ribs and low back Pain Descriptors / Indicators: Aching Pain Intervention(s): Limited activity within patient's tolerance;Monitored during session;Premedicated before session;Repositioned    Home Living Family/patient expects to be discharged to:: Skilled nursing facility                      Prior Function Level of Independence: Needs assistance   Gait / Transfers Assistance Needed: walked with RW in hallway at SNF with PT per pt           Hand Dominance        Extremity/Trunk Assessment   Upper Extremity Assessment Upper Extremity Assessment: Overall WFL for tasks assessed    Lower Extremity Assessment Lower Extremity Assessment: Overall WFL for tasks assessed    Cervical / Trunk Assessment Cervical / Trunk Assessment: Kyphotic  Communication   Communication: No difficulties  Cognition Arousal/Alertness: Awake/alert Behavior During Therapy: WFL for tasks assessed/performed Overall Cognitive Status: Within Functional Limits for tasks assessed  General Comments      Exercises     Assessment/Plan    PT Assessment Patient needs continued PT services  PT Problem List Decreased activity tolerance;Decreased balance;Pain       PT Treatment Interventions Therapeutic activities;Therapeutic exercise;Functional mobility training;Gait training;Patient/family education    PT Goals (Current goals can be found in the Care Plan section)  Acute Rehab PT Goals Patient Stated Goal: walk without assistive device PT Goal Formulation: With patient Time For Goal Achievement: 11/20/20 Potential  to Achieve Goals: Good    Frequency Min 2X/week   Barriers to discharge        Co-evaluation               AM-PAC PT "6 Clicks" Mobility  Outcome Measure Help needed turning from your back to your side while in a flat bed without using bedrails?: A Little Help needed moving from lying on your back to sitting on the side of a flat bed without using bedrails?: A Little Help needed moving to and from a bed to a chair (including a wheelchair)?: A Little Help needed standing up from a chair using your arms (e.g., wheelchair or bedside chair)?: A Little Help needed to walk in hospital room?: A Little Help needed climbing 3-5 steps with a railing? : A Little 6 Click Score: 18    End of Session Equipment Utilized During Treatment: Gait belt Activity Tolerance: Patient tolerated treatment well;No increased pain Patient left: in bed;with call bell/phone within reach Nurse Communication: Mobility status PT Visit Diagnosis: Difficulty in walking, not elsewhere classified (R26.2);Pain    Time: [id number] PT Time Calculation (min) (ACUTE ONLY): 32 min   Charges:   PT Evaluation $PT Eval Moderate Complexity: 1 Mod PT Treatments $Gait Training: 8-22 mins       Blondell Reveal Kistler PT 11/06/2020  Acute Rehabilitation Services Pager [phone] Office [phone]

## 2020-11-06 NOTE — Progress Notes (Addendum)
CSW spoke with Olivia Mackie at [facility] who states the patient can return to the facility after a new insurance authorization is obtained from [facility].  CSW initiated insurance authorization from [facility] for SNF placement.  Madilyn Fireman, MSW, LCSW Transitions of Care  Clinical Social Worker II [phone]

## 2020-11-07 ENCOUNTER — Other Ambulatory Visit: Payer: Self-pay

## 2020-11-07 LAB — CBC
HCT: 25 % — ABNORMAL LOW (ref 39.0–52.0)
Hemoglobin: 8 g/dL — ABNORMAL LOW (ref 13.0–17.0)
MCH: 32 pg (ref 26.0–34.0)
MCHC: 32 g/dL (ref 30.0–36.0)
MCV: 100 fL (ref 80.0–100.0)
Platelets: 429 10*3/uL — ABNORMAL HIGH (ref 150–400)
RBC: 2.5 MIL/uL — ABNORMAL LOW (ref 4.22–5.81)
RDW: 13.2 % (ref 11.5–15.5)
WBC: 11.7 10*3/uL — ABNORMAL HIGH (ref 4.0–10.5)
nRBC: 0 % (ref 0.0–0.2)

## 2020-11-07 LAB — BASIC METABOLIC PANEL
Anion gap: 8 (ref 5–15)
BUN: 23 mg/dL (ref 8–23)
CO2: 32 mmol/L (ref 22–32)
Calcium: 8.2 mg/dL — ABNORMAL LOW (ref 8.9–10.3)
Chloride: 96 mmol/L — ABNORMAL LOW (ref 98–111)
Creatinine, Ser: 1.2 mg/dL (ref 0.61–1.24)
GFR, Estimated: >60 mL/min (ref >60)
Glucose, Bld: 102 mg/dL — ABNORMAL HIGH (ref 70–99)
Potassium: 3.7 mmol/L (ref 3.5–5.1)
Sodium: 136 mmol/L (ref 135–145)

## 2020-11-07 MED ORDER — CHLORHEXIDINE GLUCONATE CLOTH 2 % EX PADS
6.0000 | MEDICATED_PAD | Freq: Every day | CUTANEOUS | Status: DC
  Administered 2020-11-07 – 2020-11-08 (×2): 6 via TOPICAL

## 2020-11-07 MED ORDER — SODIUM CHLORIDE 0.9% FLUSH: 10.0000 mL | INTRAVENOUS | Status: DC | PRN

## 2020-11-07 MED ORDER — LIP MEDEX EX OINT
TOPICAL_OINTMENT | CUTANEOUS | Status: AC
  Filled 2020-11-07: qty 7

## 2020-11-07 MED ORDER — LIP MEDEX EX OINT: TOPICAL_OINTMENT | CUTANEOUS | Status: DC | PRN

## 2020-11-07 MED ORDER — SODIUM CHLORIDE 0.9% FLUSH
10.0000 mL | Freq: Two times a day (BID) | INTRAVENOUS | Status: DC
Start: 2020-11-07 — End: 2020-11-09
  Administered 2020-11-07 – 2020-11-08 (×3): 10 mL

## 2020-11-07 MED ORDER — VANCOMYCIN HCL 750 MG/150ML IV SOLN
750.0000 mg | Freq: Two times a day (BID) | INTRAVENOUS | Status: DC
Start: 2020-11-07 — End: 2020-11-09
  Administered 2020-11-07 – 2020-11-08 (×3): 750 mg via INTRAVENOUS
  Filled 2020-11-07 (×3): qty 150

## 2020-11-07 NOTE — TOC Initial Note (Signed)
Transition of Care [facility]) - Initial/Assessment Note    Patient Details  Name: [name] MRN: [id number] Date of Birth: [birth year]  Transition of [facility]) CM/SW Contact:    Trish Mage, LCSW Phone Number: 11/07/2020, 2:28 PM  Clinical Narrative:   Patient who is stable for d/c is awaiting insurance authorization.  Reference [id number]. Olivia Mackie at [facility] is willing to accept him as late as 9PM. CSW will contact floor to let them know if insurance Josem Kaufmann is received.  If so, call PTAR to see if they can get him there by 9, and call report to [phone]. TOC will continue to follow during the course of hospitalization.               Expected Discharge Plan: Skilled Nursing Facility Barriers to Discharge: Other (must enter comment) (insurance auth)   Patient Goals and CMS Choice        Expected Discharge Plan and Services Expected Discharge Plan: [place]         Expected Discharge Date: 11/07/20                                    Prior Living Arrangements/Services                       Activities of Daily Living Home Assistive Devices/Equipment: Chana Bode (specify type), Nebulizer, Oxygen (Patient from [facility] has necessary equipment for their patients) ADL Screening (condition at time of admission) Patient's cognitive ability adequate to safely complete daily activities?: Yes Is the patient deaf or have difficulty hearing?: No Does the patient have difficulty seeing, even when wearing glasses/contacts?: Yes Does the patient have difficulty concentrating, remembering, or making decisions?: Yes Patient able to express need for assistance with ADLs?: Yes Does the patient have difficulty dressing or bathing?: Yes Independently performs ADLs?: No Communication: Independent Dressing (OT): Needs assistance Is this a change from baseline?: Pre-admission baseline Grooming: Needs assistance Is this a change from baseline?:  Pre-admission baseline Feeding: Needs assistance Is this a change from baseline?: Pre-admission baseline Bathing: Needs assistance Is this a change from baseline?: Pre-admission baseline Toileting: Needs assistance Is this a change from baseline?: Pre-admission baseline In/Out Bed: Needs assistance Is this a change from baseline?: Pre-admission baseline Walks in Home: Needs assistance Is this a change from baseline?: Pre-admission baseline Does the patient have difficulty walking or climbing stairs?: Yes (secondary to weakness) Weakness of Legs: Both Weakness of Arms/Hands: Right  Permission Sought/Granted                  Emotional Assessment              Admission diagnosis:  Fall [W19.XXXA] Healthcare-associated pneumonia [J18.9] Acute on chronic respiratory failure with hypoxia ([place]) [J96.21] Patient Active Problem List   Diagnosis Date Noted   Thrush 11/06/2020   HCAP (healthcare-associated pneumonia) 11/06/2020   Acute on chronic respiratory failure with hypoxia ([place]) 11/05/2020   Anemia of chronic disease 11/05/2020   Closed fracture of multiple ribs of left side with routine healing 10/16/2020   Laceration of arm, left, complicated, subsequent encounter 10/16/2020   Traumatic pneumothorax and hemothorax, subsequent encounter 10/16/2020   Abnormal gait 05/19/2020   Anxiety 05/19/2020   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) 05/19/2020   Tobacco dependence 05/19/2020   Vitamin B12 deficiency  05/19/2020   Sensory disturbance 04/13/2014   Epigastric pain    Orthostasis    COPD exacerbation ([place]) 04/12/2014   Chest pain 04/12/2014   Numbness and tingling 04/12/2014   CHEST PAIN 06/27/2008   ABNORMAL FINDINGS GI TRACT 06/27/2008   COLONIC POLYPS, HX OF 06/27/2008   WEIGHT LOSS 05/23/2008   EPIGASTRIC PAIN 05/23/2008   NONSPEC ABN FINDNG RAD & OTH EXAM ABDOMINAL AREA 05/23/2008   TOBACCO ABUSE 03/30/2007   EMPHYSEMA 03/30/2007   ASTHMA 03/30/2007   COPD (chronic obstructive pulmonary disease) ([place]) 03/30/2007   OSTEOARTHRITIS 03/30/2007   ARTHRITIS 03/30/2007   DEGENERATIVE [place] DISEASE, 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   PCP:  Maurice Small, MD Pharmacy:   [id number] [place], [id number]: [phone] Fax: [phone]    Social Determinants of Health (SDOH) Interventions    Readmission Risk Interventions No flowsheet data found.

## 2020-11-07 NOTE — Progress Notes (Signed)
  PROGRESS NOTE  Patient was supposed to discharge back to SNF yesterday, delayed due to SNF/insurance issues.  Leukocytosis improved today 11.7.  Patient without any new complaints, no worsening shortness of breath, cough, pain, or chills.  Blood pressure remained stable today at 100/60.  Blood cultures are negative to date.  He remains stable to discharge back to SNF today.   Dessa Phi, DO [facility] 11/07/2020, 10:05 AM  Available via Epic secure chat 7am-7pm After these hours, please refer to coverage provider listed on [email or web address]

## 2020-11-08 DIAGNOSIS — R531 Weakness: Secondary | ICD-10-CM | POA: Diagnosis not present

## 2020-11-08 DIAGNOSIS — R112 Nausea with vomiting, unspecified: Secondary | ICD-10-CM | POA: Diagnosis not present

## 2020-11-08 DIAGNOSIS — M170 Bilateral primary osteoarthritis of knee: Secondary | ICD-10-CM | POA: Diagnosis not present

## 2020-11-08 DIAGNOSIS — Z720 Tobacco use: Secondary | ICD-10-CM | POA: Diagnosis not present

## 2020-11-08 DIAGNOSIS — G894 Chronic pain syndrome: Secondary | ICD-10-CM | POA: Diagnosis not present

## 2020-11-08 DIAGNOSIS — S272XXD Traumatic hemopneumothorax, subsequent encounter: Secondary | ICD-10-CM | POA: Diagnosis not present

## 2020-11-08 DIAGNOSIS — Z743 Need for continuous supervision: Secondary | ICD-10-CM | POA: Diagnosis not present

## 2020-11-08 DIAGNOSIS — R0602 Shortness of breath: Secondary | ICD-10-CM | POA: Diagnosis not present

## 2020-11-08 DIAGNOSIS — E538 Deficiency of other specified B group vitamins: Secondary | ICD-10-CM | POA: Diagnosis not present

## 2020-11-08 DIAGNOSIS — J9621 Acute and chronic respiratory failure with hypoxia: Secondary | ICD-10-CM | POA: Diagnosis not present

## 2020-11-08 DIAGNOSIS — S271XXD Traumatic hemothorax, subsequent encounter: Secondary | ICD-10-CM | POA: Diagnosis not present

## 2020-11-08 DIAGNOSIS — J9611 Chronic respiratory failure with hypoxia: Secondary | ICD-10-CM | POA: Diagnosis not present

## 2020-11-08 DIAGNOSIS — Z20822 Contact with and (suspected) exposure to covid-19: Secondary | ICD-10-CM | POA: Diagnosis not present

## 2020-11-08 DIAGNOSIS — J45909 Unspecified asthma, uncomplicated: Secondary | ICD-10-CM | POA: Diagnosis not present

## 2020-11-08 DIAGNOSIS — B370 Candidal stomatitis: Secondary | ICD-10-CM | POA: Diagnosis not present

## 2020-11-08 DIAGNOSIS — D519 Vitamin B12 deficiency anemia, unspecified: Secondary | ICD-10-CM | POA: Diagnosis not present

## 2020-11-08 DIAGNOSIS — Z79899 Other long term (current) drug therapy: Secondary | ICD-10-CM | POA: Diagnosis not present

## 2020-11-08 DIAGNOSIS — E039 Hypothyroidism, unspecified: Secondary | ICD-10-CM | POA: Diagnosis not present

## 2020-11-08 DIAGNOSIS — R7303 Prediabetes: Secondary | ICD-10-CM | POA: Diagnosis not present

## 2020-11-08 DIAGNOSIS — F32A Depression, unspecified: Secondary | ICD-10-CM | POA: Diagnosis not present

## 2020-11-08 DIAGNOSIS — M79644 Pain in right finger(s): Secondary | ICD-10-CM | POA: Diagnosis not present

## 2020-11-08 DIAGNOSIS — E441 Mild protein-calorie malnutrition: Secondary | ICD-10-CM | POA: Diagnosis not present

## 2020-11-08 DIAGNOSIS — D638 Anemia in other chronic diseases classified elsewhere: Secondary | ICD-10-CM | POA: Diagnosis not present

## 2020-11-08 DIAGNOSIS — S41112D Laceration without foreign body of left upper arm, subsequent encounter: Secondary | ICD-10-CM | POA: Diagnosis not present

## 2020-11-08 DIAGNOSIS — F17210 Nicotine dependence, cigarettes, uncomplicated: Secondary | ICD-10-CM | POA: Diagnosis not present

## 2020-11-08 DIAGNOSIS — J9690 Respiratory failure, unspecified, unspecified whether with hypoxia or hypercapnia: Secondary | ICD-10-CM | POA: Diagnosis not present

## 2020-11-08 DIAGNOSIS — S2242XD Multiple fractures of ribs, left side, subsequent encounter for fracture with routine healing: Secondary | ICD-10-CM | POA: Diagnosis not present

## 2020-11-08 DIAGNOSIS — J449 Chronic obstructive pulmonary disease, unspecified: Secondary | ICD-10-CM | POA: Diagnosis not present

## 2020-11-08 DIAGNOSIS — S51812D Laceration without foreign body of left forearm, subsequent encounter: Secondary | ICD-10-CM | POA: Diagnosis not present

## 2020-11-08 DIAGNOSIS — M792 Neuralgia and neuritis, unspecified: Secondary | ICD-10-CM | POA: Diagnosis not present

## 2020-11-08 DIAGNOSIS — E46 Unspecified protein-calorie malnutrition: Secondary | ICD-10-CM | POA: Diagnosis not present

## 2020-11-08 DIAGNOSIS — R0902 Hypoxemia: Secondary | ICD-10-CM | POA: Diagnosis not present

## 2020-11-08 LAB — CREATININE, SERUM
Creatinine, Ser: 1.3 mg/dL — ABNORMAL HIGH (ref 0.61–1.24)
GFR, Estimated: 59 mL/min — ABNORMAL LOW (ref >60)

## 2020-11-08 MED ORDER — NYSTATIN 100000 UNIT/ML MT SUSP
5.0000 mL | Freq: Four times a day (QID) | OROMUCOSAL | Status: DC
  Administered 2020-11-08 (×3): 500000 [IU] via ORAL
  Filled 2020-11-08 (×3): qty 5

## 2020-11-08 MED ORDER — OXYCODONE HCL 5 MG PO TABS: 2.5000 mg | ORAL_TABLET | Freq: Four times a day (QID) | ORAL | 0 refills | Status: AC | PRN

## 2020-11-08 MED ORDER — CLONAZEPAM 1 MG PO TABS: 1.0000 mg | ORAL_TABLET | Freq: Two times a day (BID) | ORAL | 0 refills | Status: DC

## 2020-11-08 NOTE — TOC Transition Note (Signed)
Transition of Care [facility]) - CM/SW Discharge Note   Patient Details  Name: [name] MRN: [id number] Date of Birth: November 29, 1950  Transition [facility] Care [facility]) CM/SW Contact:  Trish Mage, LCSW Phone Number: 11/08/2020, 2:17 PM   Clinical Narrative:   Authorization received. Facility notified.  PTAR arranged.  Nursing, please call report to [phone].Room 209.  TOC sign off.    Final next level of care: Skilled Nursing Facility Barriers to Discharge: Barriers Resolved   Patient Goals and CMS Choice        Discharge Placement                       Discharge Plan and Services                                     Social Determinants of Health (SDOH) Interventions     Readmission Risk Interventions No flowsheet data found.

## 2020-11-08 NOTE — Progress Notes (Signed)
Patient seen and examined.  Stable for discharge.  See updated discharge summary dated from 11/08/2020.

## 2020-11-08 NOTE — Discharge Summary (Signed)
Physician Discharge Summary  [name] [id number] DOB: 04/07/1951 DOA: 11/05/2020  PCP: Maurice Small, MD  Admit date: 11/05/2020 Discharge date: 11/08/2020  Admitted From: Skilled nursing facility Disposition: Skilled nursing facility  Recommendations for Outpatient Follow-up:  Follow up with PCP in 1-2 weeks Please obtain BMP/CBC in one week   Home Health: Not applicable Equipment/Devices: Not applicable  Discharge Condition: Stable CODE STATUS: Full code Diet recommendation: Regular diet, nutritional supplements  Discharge summary:  70 year old gentleman has history of COPD, he had prolonged hospitalization at [facility] after trauma complicated by multiple rib fractures, pneumothorax and chest tube placement and discharged home on 2 L oxygen.  Patient was readmitted to [facility] 7/21-7/27 with pneumonia and found to have bacteremia with MRSA.  Echocardiogram with no vegetations as per reports.  Was discharged to a skilled nursing facility with PICC line for plans to complete 2 weeks of vancomycin until 8/3.  Apparently, at the nursing home he did not realize the guardrails were not up so he slid off hurting his right wrist.  X-ray was concerning for lunate or perilunate dislocation of the wrist with no acute fracture.  Patient did not have much symptoms but he was sent to ER. In the emergency room hemodynamically stable.  On 4 L of oxygen.  Chest x-ray with slightly worsened pleural effusion otherwise no new findings.  Known old fractures and a scarring.  Acute on chronic hypoxemic respiratory failure: Due to MRSA pneumonia that was recently treated and clinically improving.  According to previous culture sensitivity results, patient will complete vancomycin therapy today.  Remove PICC line.  No more antibiotics needed.  Continue aggressive chest physiotherapy, incentive spirometry and mobility at the skilled nursing facility. He does have underlying COPD, he can use  bronchodilator therapy as needed.  Keep on oxygen to keep oxygen saturation more than 90%.  He may chronically require oxygen once discharged from the SNF.  Recently on 2 L oxygen as outpatient. In the hospital, he was treated with vancomycin and Zosyn.  Completing therapy today before discharge. Repeat blood cultures were negative.  Recent multiple rib fractures, chronic pain issues: Pain is well controlled.  Intermittently using oxycodone.  With this will be prescribed.  Acute on chronic anemia of chronic disease: Presented with hemoglobin 8.1.  Recheck hemoglobin is less than 7.  He has chronic anemia.  Received 2 units of PRBC with appropriate response.  Debility: Will benefit with ongoing PT OT.  Transfer to claps.  Right wrist fracture dislocation: Ruled out.  X-ray from the facility reported as perilunate dislocation, however new x-ray at ER and clinical examination not consistent with any acute injuries or dislocation.  Has full range of motion.  No indication to immobilize.  Continue to work with occupational therapist.  Patient is medically stable.  He is able to go back to skilled nursing facility today to continue therapies.  As per previous discharge documentation, will discontinue PICC line before transfer.   Discharge Diagnoses:  Principal Problem:   Acute on chronic respiratory failure with hypoxia (HCC) Active Problems:   TOBACCO ABUSE   COPD (chronic obstructive pulmonary disease) (HCC)   Chronic pain syndrome   Protein calorie malnutrition (HCC)   Vitamin B12 deficiency   Closed fracture of multiple ribs of left side with routine healing   Laceration of arm, left, complicated, subsequent encounter   Traumatic pneumothorax and hemothorax, subsequent encounter   Anemia of chronic disease   Thrush   HCAP (healthcare-associated pneumonia)  Discharge Instructions  Discharge Instructions     Increase activity slowly   Complete by: As directed       Allergies  as of 11/08/2020       Reactions   Erythromycin Base Other (See Comments)   hallucinations   Mirtazapine Other (See Comments)   Nsaids Other (See Comments)        Medication List     STOP taking these medications    vancomycin 750 mg in sodium chloride 0.9 % 250 mL       TAKE these medications    albuterol (2.5 MG/3ML) 0.083% nebulizer solution Commonly known as: PROVENTIL Take 2.5 mg by nebulization every 2 (two) hours as needed for wheezing or shortness of breath.   citalopram 40 MG tablet Commonly known as: CELEXA Take 40 mg by mouth daily.   clonazePAM 1 MG tablet Commonly known as: KLONOPIN Take 1 tablet (1 mg total) by mouth 2 (two) times daily.   fluticasone-salmeterol 500-50 MCG/ACT Aepb Commonly known as: ADVAIR Inhale 1 puff into the lungs in the morning and at bedtime.   gabapentin 300 MG capsule Commonly known as: NEURONTIN Take 300 mg by mouth 3 (three) times daily.   lactose free nutrition Liqd Take 237 mLs by mouth 3 (three) times daily between meals.   Nutritional Supplement Liqd Take by mouth 3 (three) times daily with meals. Magic Cup   magic mouthwash w/lidocaine Soln Take 5 mLs by mouth 4 (four) times daily. Swish and swallow   magnesium oxide 400 MG tablet Commonly known as: MAG-OX Take 400 mg by mouth daily.   nicotine 21 mg/24hr patch Commonly known as: NICODERM CQ - dosed in mg/24 hours Place 21 mg onto the skin every morning.   oxyCODONE 5 MG immediate release tablet Commonly known as: Oxy IR/ROXICODONE Take 0.5-1 tablets (2.5-5 mg total) by mouth every 6 (six) hours as needed for up to 5 days for moderate pain or severe pain (2.5 for moderate, 5 for severe).   potassium chloride 10 MEQ tablet Commonly known as: KLOR-CON Take 10 mEq by mouth daily.   promethazine 25 MG tablet Commonly known as: PHENERGAN Take 25 mg by mouth every 8 (eight) hours as needed for nausea or vomiting.   traZODone 50 MG tablet Commonly known as:  DESYREL Take 50 mg by mouth at bedtime as needed for sleep.        Follow-up Information     Maurice Small, MD Follow up.   Specialty: Family Medicine Contact information: [address] [place] [address] [phone]                Allergies  Allergen Reactions   Erythromycin Base Other (See Comments)    hallucinations   Mirtazapine Other (See Comments)   Nsaids Other (See Comments)    Consultations: None   Procedures/Studies: DG Chest 2 View  Result Date: 11/05/2020 CLINICAL DATA:  Hypoxia.  [place] out of bed yesterday. EXAM: CHEST - 2 VIEW COMPARISON:  October 26, 2020 FINDINGS: Known pulmonary nodule in the left upper lobe. Emphysematous changes in the lungs. There is a left pleural effusion with underlying atelectasis. Infiltrate in the right lung persists, largely obscured by layering pleural effusion not seen previously. A right PICC line terminates in the central SVC. The cardiomediastinal silhouette is unremarkable. IMPRESSION: 1. There is a new layering right pleural effusion which partially obscures the infiltrate in the right base consistent with pneumonia. 2. Small left effusion with underlying atelectasis.  3. Known nodule in the left upper lobe, better seen on previous CT imaging. Electronically Signed   By: Dorise Bullion III M.D   On: 11/05/2020 14:26   DG Wrist 2 Views Right  Result Date: 11/05/2020 CLINICAL DATA:  Fall yesterday. Right thumb pain since fall, x-ray at facility reported concern for dislocation. EXAM: RIGHT WRIST - 2 VIEW COMPARISON:  Hand radiograph earlier today. FINDINGS: No acute fracture. Distal radioulnar joint is intact. There is volar tilt of the lunate which can be seen in the setting of carpal instability. Slight widening of the scapholunate interval at 5 mm. 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 IMPRESSION: 1. No acute fracture of the right wrist. 2. Volar tilt of the lunate which can be seen with  carpal instability with associated widening of the scapholunate interval. 3. 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 Electronically Signed   By: Keith Rake M.D.   On: 11/05/2020 22:01   DG Hand Complete Right  Result Date: 11/05/2020 CLINICAL DATA:  Pain in right thumb after rolling out of bed. EXAM: RIGHT HAND - COMPLETE 3+ VIEW COMPARISON:  None. FINDINGS: There is no evidence of fracture or dislocation. Moderate degenerative changes noted at the basilar joint. Soft tissues are unremarkable. IMPRESSION: No acute abnormality.  Basilar joint osteoarthritis. Electronically Signed   By: Kerby Moors M.D.   On: 11/05/2020 11:42   (Echo, Carotid, EGD, Colonoscopy, ERCP)    Subjective: Patient seen and examined.  No overnight events.  He is eager to go back to skilled nursing facility.  He tells me that he needs rehab and not a hospital.  Denies any pain or swelling.  He was wondering why he was sent to the emergency room for his right wrist which has no problems.   Discharge Exam: Vitals:   11/08/20 0504 11/08/20 1206  BP: (!) 91/56   Pulse: 64   Resp: 18   Temp: 97.7 F (36.5 C)   SpO2: 98% 94%   Vitals:   11/07/20 2031 11/08/20 0401 11/08/20 0504 11/08/20 1206  BP: 110/70  (!) 91/56   Pulse: 72  64   Resp: 18  18   Temp: (!) 97.3 F (36.3 C)  97.7 F (36.5 C)   TempSrc: Axillary  Axillary   SpO2: 93%  98% 94%  Weight:  47.6 kg      General: Pt is alert, awake, not in acute distress Seen and cachectic.  Not in any distress.  Currently on 3 L of oxygen. Cardiovascular: RRR, S1/S2 +, no rubs, no gallops Respiratory: CTA bilaterally, no wheezing, no rhonchi Abdominal: Soft, NT, ND, bowel sounds + Extremities: no edema, no cyanosis    The results of significant diagnostics from this hospitalization (including imaging, microbiology, ancillary and laboratory) are listed below for reference.     Microbiology: Recent Results (from the past 240  hour(s))  Resp Panel by RT-PCR (Flu A&B, Covid) Nasopharyngeal Swab     Status: None   Collection Time: 11/05/20  9:37 PM   Specimen: Nasopharyngeal Swab; Nasopharyngeal(NP) swabs in vial transport medium  Result Value Ref Range Status   SARS Coronavirus 2 by RT PCR NEGATIVE NEGATIVE Final    Comment: (NOTE) SARS-CoV-2 target nucleic acids are NOT DETECTED.  The SARS-CoV-2 RNA is generally detectable in upper respiratory specimens during the acute phase of infection. The lowest concentration of SARS-CoV-2 viral copies this assay can detect is 138 copies/mL. A negative result does not preclude SARS-Cov-2 infection and should  not be used as the sole basis for treatment or other patient management decisions. A negative result may occur with  improper specimen collection/handling, submission of specimen other than nasopharyngeal swab, presence of viral mutation(s) within the areas targeted by this assay, and inadequate number of viral copies(<138 copies/mL). A negative result must be combined with clinical observations, patient history, and epidemiological information. The expected result is Negative.  Fact Sheet for Patients:  EntrepreneurPulse.com.au  Fact Sheet for Healthcare Providers:  IncredibleEmployment.be  This test is no t yet approved or cleared by the [place] FDA and  has been authorized for detection and/or diagnosis of SARS-CoV-2 by FDA under an Emergency Use Authorization (EUA). This EUA will remain  in effect (meaning this test can be used) for the duration of the COVID-19 declaration under Section 564(b)(1) of the Act, 21 U.S.C.section 360bbb-3(b)(1), unless the authorization is terminated  or revoked sooner.       Influenza A by PCR NEGATIVE NEGATIVE Final   Influenza B by PCR NEGATIVE NEGATIVE Final    Comment: (NOTE) The Xpert Xpress SARS-CoV-2/FLU/RSV plus assay is intended as an aid in the diagnosis of influenza from  Nasopharyngeal swab specimens and should not be used as a sole basis for treatment. Nasal washings and aspirates are unacceptable for Xpert Xpress SARS-CoV-2/FLU/RSV testing.  Fact Sheet for Patients: EntrepreneurPulse.com.au  Fact Sheet for Healthcare Providers: IncredibleEmployment.be  This test is not yet approved or cleared by the [place] FDA and has been authorized for detection and/or diagnosis of SARS-CoV-2 by FDA under an Emergency Use Authorization (EUA). This EUA will remain in effect (meaning this test can be used) for the duration of the COVID-19 declaration under Section 564(b)(1) of the Act, 21 U.S.C. section 360bbb-3(b)(1), unless the authorization is terminated or revoked.  Performed at [facility], [place] [address] [place], [place] [address]   Blood culture (routine single)     Status: None (Preliminary result)   Collection Time: 11/05/20  9:50 PM   Specimen: BLOOD  Result Value Ref Range Status   Specimen Description   Final    BLOOD PICC LINE Performed at [facility], [place] [address], [place], [place] [address]    Special Requests   Final    BOTTLES DRAWN AEROBIC AND ANAEROBIC Blood Culture adequate volume Performed at [place] [address], [place], [place] [address]    Culture   Final    NO GROWTH 2 DAYS Performed at [place] [address], [place], [place] [address]    Report Status PENDING  Incomplete     Labs: BNP (last 3 results) No results for input(s): BNP in the last 8760 hours. Basic Metabolic Panel: Recent Labs  Lab 11/05/20 1403 11/07/20 0110 11/08/20 0327  NA 136 136  --   K 3.7 3.7  --   CL 98 96*  --   CO2 31 32  --   GLUCOSE 88 102*  --   BUN 19 23  --   CREATININE 1.00 1.20 1.30*  CALCIUM 8.2* 8.2*  --    Liver Function Tests: Recent Labs  Lab 11/05/20 1403  AST 16  ALT 17  ALKPHOS 59  BILITOT 0.7   PROT 5.5*  ALBUMIN 1.7*   No results for input(s): LIPASE, AMYLASE in the last 168 hours. No results for input(s): AMMONIA in the last 168 hours. CBC: Recent Labs  Lab 11/05/20 1403 11/05/20 2212 11/05/20 2320 11/07/20 0110  WBC  15.1* 17.5*  --  11.7*  NEUTROABS 12.6*  --   --   --   HGB 8.1* 5.9* 8.3* 8.0*  HCT 25.7* 18.0* 24.9* 25.0*  MCV 100.4* 98.9  --  100.0  PLT 345 442*  --  429*   Cardiac Enzymes: No results for input(s): CKTOTAL, CKMB, CKMBINDEX, TROPONINI in the last 168 hours. BNP: Invalid input(s): POCBNP CBG: No results for input(s): GLUCAP in the last 168 hours. D-Dimer No results for input(s): DDIMER in the last 72 hours. Hgb A1c No results for input(s): HGBA1C in the last 72 hours. Lipid Profile No results for input(s): CHOL, HDL, LDLCALC, TRIG, CHOLHDL, LDLDIRECT in the last 72 hours. Thyroid function studies No results for input(s): TSH, T4TOTAL, T3FREE, THYROIDAB in the last 72 hours.  Invalid input(s): FREET3 Anemia work up Recent Labs    11/05/20 2137 11/05/20 2221  VITAMINB12  --  547  FERRITIN 394*  --   TIBC 135*  --   IRON 12*  --    Urinalysis    Component Value Date/Time   COLORURINE STRAW (A) 11/05/2020 1507   APPEARANCEUR CLEAR 11/05/2020 1507   LABSPEC 1.005 11/05/2020 1507   PHURINE 7.0 11/05/2020 1507   GLUCOSEU NEGATIVE 11/05/2020 1507   HGBUR NEGATIVE 11/05/2020 1507   BILIRUBINUR NEGATIVE 11/05/2020 1507   KETONESUR NEGATIVE 11/05/2020 1507   PROTEINUR NEGATIVE 11/05/2020 1507   UROBILINOGEN 0.2 04/12/2014 1240   NITRITE NEGATIVE 11/05/2020 1507   LEUKOCYTESUR NEGATIVE 11/05/2020 1507   Sepsis Labs Invalid input(s): PROCALCITONIN,  WBC,  LACTICIDVEN Microbiology Recent Results (from the past 240 hour(s))  Resp Panel by RT-PCR (Flu A&B, Covid) Nasopharyngeal Swab     Status: None   Collection Time: 11/05/20  9:37 PM   Specimen: Nasopharyngeal Swab; Nasopharyngeal(NP) swabs in vial transport medium  Result Value  Ref Range Status   SARS Coronavirus 2 by RT PCR NEGATIVE NEGATIVE Final    Comment: (NOTE) SARS-CoV-2 target nucleic acids are NOT DETECTED.  The SARS-CoV-2 RNA is generally detectable in upper respiratory specimens during the acute phase of infection. The lowest concentration of SARS-CoV-2 viral copies this assay can detect is 138 copies/mL. A negative result does not preclude SARS-Cov-2 infection and should not be used as the sole basis for treatment or other patient management decisions. A negative result may occur with  improper specimen collection/handling, submission of specimen other than nasopharyngeal swab, presence of viral mutation(s) within the areas targeted by this assay, and inadequate number of viral copies(<138 copies/mL). A negative result must be combined with clinical observations, patient history, and epidemiological information. The expected result is Negative.  Fact Sheet for Patients:  EntrepreneurPulse.com.au  Fact Sheet for Healthcare Providers:  IncredibleEmployment.be  This test is no t yet approved or cleared by the [place] FDA and  has been authorized for detection and/or diagnosis of SARS-CoV-2 by FDA under an Emergency Use Authorization (EUA). This EUA will remain  in effect (meaning this test can be used) for the duration of the COVID-19 declaration under Section 564(b)(1) of the Act, 21 U.S.C.section 360bbb-3(b)(1), unless the authorization is terminated  or revoked sooner.       Influenza A by PCR NEGATIVE NEGATIVE Final   Influenza B by PCR NEGATIVE NEGATIVE Final    Comment: (NOTE) The Xpert Xpress SARS-CoV-2/FLU/RSV plus assay is intended as an aid in the diagnosis of influenza from Nasopharyngeal swab specimens and should not be used as a sole basis for treatment. Nasal washings and aspirates are unacceptable  for Xpert Xpress SARS-CoV-2/FLU/RSV testing.  Fact Sheet for  Patients: EntrepreneurPulse.com.au  Fact Sheet for Healthcare Providers: IncredibleEmployment.be  This test is not yet approved or cleared by the [place] FDA and has been authorized for detection and/or diagnosis of SARS-CoV-2 by FDA under an Emergency Use Authorization (EUA). This EUA will remain in effect (meaning this test can be used) for the duration of the COVID-19 declaration under Section 564(b)(1) of the Act, 21 U.S.C. section 360bbb-3(b)(1), unless the authorization is terminated or revoked.  Performed at [facility], [place] [address] [place], [place] [address]   Blood culture (routine single)     Status: None (Preliminary result)   Collection Time: 11/05/20  9:50 PM   Specimen: BLOOD  Result Value Ref Range Status   Specimen Description   Final    BLOOD PICC LINE Performed at [facility], [place] [address], [place], [place] [address]    Special Requests   Final    BOTTLES DRAWN AEROBIC AND ANAEROBIC Blood Culture adequate volume Performed at [place] [address] [place], [place] [address]    Culture   Final    NO GROWTH 2 DAYS Performed at [place] [address] [place], [place] [address]    Report Status PENDING  Incomplete     Time coordinating discharge:  32 minutes  SIGNED:   Barb Merino, MD  [facility] 11/08/2020, 1:02 PM

## 2020-11-10 DIAGNOSIS — E441 Mild protein-calorie malnutrition: Secondary | ICD-10-CM | POA: Diagnosis not present

## 2020-11-10 DIAGNOSIS — J449 Chronic obstructive pulmonary disease, unspecified: Secondary | ICD-10-CM | POA: Diagnosis not present

## 2020-11-10 DIAGNOSIS — M170 Bilateral primary osteoarthritis of knee: Secondary | ICD-10-CM | POA: Diagnosis not present

## 2020-11-11 LAB — CULTURE, BLOOD (SINGLE)
Culture: NO GROWTH
Special Requests: ADEQUATE

## 2020-11-15 DIAGNOSIS — Z79899 Other long term (current) drug therapy: Secondary | ICD-10-CM | POA: Diagnosis not present

## 2020-11-20 DIAGNOSIS — Z9181 History of falling: Secondary | ICD-10-CM | POA: Diagnosis not present

## 2020-11-20 DIAGNOSIS — J9611 Chronic respiratory failure with hypoxia: Secondary | ICD-10-CM | POA: Diagnosis not present

## 2020-11-20 DIAGNOSIS — S51812D Laceration without foreign body of left forearm, subsequent encounter: Secondary | ICD-10-CM | POA: Diagnosis not present

## 2020-11-20 DIAGNOSIS — D638 Anemia in other chronic diseases classified elsewhere: Secondary | ICD-10-CM | POA: Diagnosis not present

## 2020-11-20 DIAGNOSIS — G894 Chronic pain syndrome: Secondary | ICD-10-CM | POA: Diagnosis not present

## 2020-11-20 DIAGNOSIS — B370 Candidal stomatitis: Secondary | ICD-10-CM | POA: Diagnosis not present

## 2020-11-20 DIAGNOSIS — E538 Deficiency of other specified B group vitamins: Secondary | ICD-10-CM | POA: Diagnosis not present

## 2020-11-20 DIAGNOSIS — R911 Solitary pulmonary nodule: Secondary | ICD-10-CM | POA: Diagnosis not present

## 2020-11-20 DIAGNOSIS — F32A Depression, unspecified: Secondary | ICD-10-CM | POA: Diagnosis not present

## 2020-11-20 DIAGNOSIS — S270XXD Traumatic pneumothorax, subsequent encounter: Secondary | ICD-10-CM | POA: Diagnosis not present

## 2020-11-20 DIAGNOSIS — E876 Hypokalemia: Secondary | ICD-10-CM | POA: Diagnosis not present

## 2020-11-20 DIAGNOSIS — Z86711 Personal history of pulmonary embolism: Secondary | ICD-10-CM | POA: Diagnosis not present

## 2020-11-20 DIAGNOSIS — S2242XD Multiple fractures of ribs, left side, subsequent encounter for fracture with routine healing: Secondary | ICD-10-CM | POA: Diagnosis not present

## 2020-11-20 DIAGNOSIS — Z8616 Personal history of COVID-19: Secondary | ICD-10-CM | POA: Diagnosis not present

## 2020-11-20 DIAGNOSIS — T797XXD Traumatic subcutaneous emphysema, subsequent encounter: Secondary | ICD-10-CM | POA: Diagnosis not present

## 2020-11-20 DIAGNOSIS — E871 Hypo-osmolality and hyponatremia: Secondary | ICD-10-CM | POA: Diagnosis not present

## 2020-11-20 DIAGNOSIS — F17210 Nicotine dependence, cigarettes, uncomplicated: Secondary | ICD-10-CM | POA: Diagnosis not present

## 2020-11-20 DIAGNOSIS — Z9981 Dependence on supplemental oxygen: Secondary | ICD-10-CM | POA: Diagnosis not present

## 2020-11-20 DIAGNOSIS — B9562 Methicillin resistant Staphylococcus aureus infection as the cause of diseases classified elsewhere: Secondary | ICD-10-CM | POA: Diagnosis not present

## 2020-11-20 DIAGNOSIS — R7881 Bacteremia: Secondary | ICD-10-CM | POA: Diagnosis not present

## 2020-11-20 DIAGNOSIS — J440 Chronic obstructive pulmonary disease with acute lower respiratory infection: Secondary | ICD-10-CM | POA: Diagnosis not present

## 2020-11-20 DIAGNOSIS — E43 Unspecified severe protein-calorie malnutrition: Secondary | ICD-10-CM | POA: Diagnosis not present

## 2020-11-22 DIAGNOSIS — E876 Hypokalemia: Secondary | ICD-10-CM | POA: Diagnosis not present

## 2020-11-22 DIAGNOSIS — Z7689 Persons encountering health services in other specified circumstances: Secondary | ICD-10-CM | POA: Diagnosis not present

## 2020-11-22 DIAGNOSIS — S2249XA Multiple fractures of ribs, unspecified side, initial encounter for closed fracture: Secondary | ICD-10-CM | POA: Diagnosis not present

## 2020-11-22 DIAGNOSIS — Z789 Other specified health status: Secondary | ICD-10-CM | POA: Diagnosis not present

## 2020-11-27 DIAGNOSIS — B370 Candidal stomatitis: Secondary | ICD-10-CM | POA: Diagnosis not present

## 2020-11-27 DIAGNOSIS — Z8616 Personal history of COVID-19: Secondary | ICD-10-CM | POA: Diagnosis not present

## 2020-11-27 DIAGNOSIS — D638 Anemia in other chronic diseases classified elsewhere: Secondary | ICD-10-CM | POA: Diagnosis not present

## 2020-11-27 DIAGNOSIS — S2242XD Multiple fractures of ribs, left side, subsequent encounter for fracture with routine healing: Secondary | ICD-10-CM | POA: Diagnosis not present

## 2020-11-27 DIAGNOSIS — T797XXD Traumatic subcutaneous emphysema, subsequent encounter: Secondary | ICD-10-CM | POA: Diagnosis not present

## 2020-11-27 DIAGNOSIS — J440 Chronic obstructive pulmonary disease with acute lower respiratory infection: Secondary | ICD-10-CM | POA: Diagnosis not present

## 2020-11-27 DIAGNOSIS — R911 Solitary pulmonary nodule: Secondary | ICD-10-CM | POA: Diagnosis not present

## 2020-11-27 DIAGNOSIS — S51812D Laceration without foreign body of left forearm, subsequent encounter: Secondary | ICD-10-CM | POA: Diagnosis not present

## 2020-11-27 DIAGNOSIS — Z9981 Dependence on supplemental oxygen: Secondary | ICD-10-CM | POA: Diagnosis not present

## 2020-11-27 DIAGNOSIS — F32A Depression, unspecified: Secondary | ICD-10-CM | POA: Diagnosis not present

## 2020-11-27 DIAGNOSIS — B9562 Methicillin resistant Staphylococcus aureus infection as the cause of diseases classified elsewhere: Secondary | ICD-10-CM | POA: Diagnosis not present

## 2020-11-27 DIAGNOSIS — S270XXD Traumatic pneumothorax, subsequent encounter: Secondary | ICD-10-CM | POA: Diagnosis not present

## 2020-11-27 DIAGNOSIS — Z9181 History of falling: Secondary | ICD-10-CM | POA: Diagnosis not present

## 2020-11-27 DIAGNOSIS — R7881 Bacteremia: Secondary | ICD-10-CM | POA: Diagnosis not present

## 2020-11-27 DIAGNOSIS — G894 Chronic pain syndrome: Secondary | ICD-10-CM | POA: Diagnosis not present

## 2020-11-27 DIAGNOSIS — F17210 Nicotine dependence, cigarettes, uncomplicated: Secondary | ICD-10-CM | POA: Diagnosis not present

## 2020-11-27 DIAGNOSIS — J9611 Chronic respiratory failure with hypoxia: Secondary | ICD-10-CM | POA: Diagnosis not present

## 2020-11-27 DIAGNOSIS — E43 Unspecified severe protein-calorie malnutrition: Secondary | ICD-10-CM | POA: Diagnosis not present

## 2020-11-27 DIAGNOSIS — E876 Hypokalemia: Secondary | ICD-10-CM | POA: Diagnosis not present

## 2020-11-27 DIAGNOSIS — E538 Deficiency of other specified B group vitamins: Secondary | ICD-10-CM | POA: Diagnosis not present

## 2020-11-27 DIAGNOSIS — E871 Hypo-osmolality and hyponatremia: Secondary | ICD-10-CM | POA: Diagnosis not present

## 2020-11-27 DIAGNOSIS — Z86711 Personal history of pulmonary embolism: Secondary | ICD-10-CM | POA: Diagnosis not present

## 2020-11-29 ENCOUNTER — Institutional Professional Consult (permissible substitution): Payer: Medicare Other | Admitting: Pulmonary Disease

## 2020-11-30 DIAGNOSIS — J9611 Chronic respiratory failure with hypoxia: Secondary | ICD-10-CM | POA: Diagnosis not present

## 2020-11-30 DIAGNOSIS — E871 Hypo-osmolality and hyponatremia: Secondary | ICD-10-CM | POA: Diagnosis not present

## 2020-11-30 DIAGNOSIS — F32A Depression, unspecified: Secondary | ICD-10-CM | POA: Diagnosis not present

## 2020-11-30 DIAGNOSIS — Z86711 Personal history of pulmonary embolism: Secondary | ICD-10-CM | POA: Diagnosis not present

## 2020-11-30 DIAGNOSIS — B9562 Methicillin resistant Staphylococcus aureus infection as the cause of diseases classified elsewhere: Secondary | ICD-10-CM | POA: Diagnosis not present

## 2020-11-30 DIAGNOSIS — S270XXD Traumatic pneumothorax, subsequent encounter: Secondary | ICD-10-CM | POA: Diagnosis not present

## 2020-11-30 DIAGNOSIS — R918 Other nonspecific abnormal finding of lung field: Secondary | ICD-10-CM | POA: Diagnosis not present

## 2020-11-30 DIAGNOSIS — R7881 Bacteremia: Secondary | ICD-10-CM | POA: Diagnosis not present

## 2020-11-30 DIAGNOSIS — T797XXD Traumatic subcutaneous emphysema, subsequent encounter: Secondary | ICD-10-CM | POA: Diagnosis not present

## 2020-11-30 DIAGNOSIS — S2242XD Multiple fractures of ribs, left side, subsequent encounter for fracture with routine healing: Secondary | ICD-10-CM | POA: Diagnosis not present

## 2020-11-30 DIAGNOSIS — B370 Candidal stomatitis: Secondary | ICD-10-CM | POA: Diagnosis not present

## 2020-11-30 DIAGNOSIS — R911 Solitary pulmonary nodule: Secondary | ICD-10-CM | POA: Diagnosis not present

## 2020-11-30 DIAGNOSIS — E876 Hypokalemia: Secondary | ICD-10-CM | POA: Diagnosis not present

## 2020-11-30 DIAGNOSIS — D638 Anemia in other chronic diseases classified elsewhere: Secondary | ICD-10-CM | POA: Diagnosis not present

## 2020-11-30 DIAGNOSIS — Z8616 Personal history of COVID-19: Secondary | ICD-10-CM | POA: Diagnosis not present

## 2020-11-30 DIAGNOSIS — J440 Chronic obstructive pulmonary disease with acute lower respiratory infection: Secondary | ICD-10-CM | POA: Diagnosis not present

## 2020-11-30 DIAGNOSIS — E538 Deficiency of other specified B group vitamins: Secondary | ICD-10-CM | POA: Diagnosis not present

## 2020-11-30 DIAGNOSIS — R0600 Dyspnea, unspecified: Secondary | ICD-10-CM | POA: Diagnosis not present

## 2020-11-30 DIAGNOSIS — S51812D Laceration without foreign body of left forearm, subsequent encounter: Secondary | ICD-10-CM | POA: Diagnosis not present

## 2020-11-30 DIAGNOSIS — Z9981 Dependence on supplemental oxygen: Secondary | ICD-10-CM | POA: Diagnosis not present

## 2020-11-30 DIAGNOSIS — F17210 Nicotine dependence, cigarettes, uncomplicated: Secondary | ICD-10-CM | POA: Diagnosis not present

## 2020-11-30 DIAGNOSIS — G894 Chronic pain syndrome: Secondary | ICD-10-CM | POA: Diagnosis not present

## 2020-11-30 DIAGNOSIS — E43 Unspecified severe protein-calorie malnutrition: Secondary | ICD-10-CM | POA: Diagnosis not present

## 2020-11-30 DIAGNOSIS — Z9181 History of falling: Secondary | ICD-10-CM | POA: Diagnosis not present

## 2020-12-02 DIAGNOSIS — J90 Pleural effusion, not elsewhere classified: Secondary | ICD-10-CM | POA: Diagnosis not present

## 2020-12-02 DIAGNOSIS — Z8614 Personal history of Methicillin resistant Staphylococcus aureus infection: Secondary | ICD-10-CM | POA: Diagnosis not present

## 2020-12-02 DIAGNOSIS — J440 Chronic obstructive pulmonary disease with acute lower respiratory infection: Secondary | ICD-10-CM | POA: Diagnosis not present

## 2020-12-02 DIAGNOSIS — R911 Solitary pulmonary nodule: Secondary | ICD-10-CM | POA: Diagnosis not present

## 2020-12-02 DIAGNOSIS — Z681 Body mass index (BMI) 19 or less, adult: Secondary | ICD-10-CM | POA: Diagnosis not present

## 2020-12-02 DIAGNOSIS — R531 Weakness: Secondary | ICD-10-CM | POA: Diagnosis not present

## 2020-12-02 DIAGNOSIS — J9611 Chronic respiratory failure with hypoxia: Secondary | ICD-10-CM | POA: Diagnosis not present

## 2020-12-02 DIAGNOSIS — J439 Emphysema, unspecified: Secondary | ICD-10-CM | POA: Diagnosis not present

## 2020-12-02 DIAGNOSIS — R0602 Shortness of breath: Secondary | ICD-10-CM | POA: Diagnosis not present

## 2020-12-02 DIAGNOSIS — I700 Atherosclerosis of aorta: Secondary | ICD-10-CM | POA: Diagnosis not present

## 2020-12-02 DIAGNOSIS — R5381 Other malaise: Secondary | ICD-10-CM | POA: Diagnosis not present

## 2020-12-02 DIAGNOSIS — F17200 Nicotine dependence, unspecified, uncomplicated: Secondary | ICD-10-CM | POA: Diagnosis not present

## 2020-12-02 DIAGNOSIS — J441 Chronic obstructive pulmonary disease with (acute) exacerbation: Secondary | ICD-10-CM | POA: Diagnosis not present

## 2020-12-02 DIAGNOSIS — E440 Moderate protein-calorie malnutrition: Secondary | ICD-10-CM | POA: Diagnosis not present

## 2020-12-02 DIAGNOSIS — Z79899 Other long term (current) drug therapy: Secondary | ICD-10-CM | POA: Diagnosis not present

## 2020-12-02 DIAGNOSIS — Z5329 Procedure and treatment not carried out because of patient's decision for other reasons: Secondary | ICD-10-CM | POA: Diagnosis not present

## 2020-12-02 DIAGNOSIS — R6889 Other general symptoms and signs: Secondary | ICD-10-CM | POA: Diagnosis not present

## 2020-12-02 DIAGNOSIS — R627 Adult failure to thrive: Secondary | ICD-10-CM | POA: Diagnosis not present

## 2020-12-02 DIAGNOSIS — Z743 Need for continuous supervision: Secondary | ICD-10-CM | POA: Diagnosis not present

## 2020-12-07 DIAGNOSIS — J439 Emphysema, unspecified: Secondary | ICD-10-CM | POA: Diagnosis not present

## 2020-12-07 DIAGNOSIS — J449 Chronic obstructive pulmonary disease, unspecified: Secondary | ICD-10-CM | POA: Diagnosis not present

## 2020-12-07 DIAGNOSIS — S2243XA Multiple fractures of ribs, bilateral, initial encounter for closed fracture: Secondary | ICD-10-CM | POA: Diagnosis not present

## 2020-12-07 DIAGNOSIS — I700 Atherosclerosis of aorta: Secondary | ICD-10-CM | POA: Diagnosis not present

## 2020-12-07 DIAGNOSIS — R911 Solitary pulmonary nodule: Secondary | ICD-10-CM | POA: Diagnosis not present

## 2020-12-07 DIAGNOSIS — S2242XA Multiple fractures of ribs, left side, initial encounter for closed fracture: Secondary | ICD-10-CM | POA: Diagnosis not present

## 2020-12-07 DIAGNOSIS — J90 Pleural effusion, not elsewhere classified: Secondary | ICD-10-CM | POA: Diagnosis not present

## 2020-12-07 DIAGNOSIS — E876 Hypokalemia: Secondary | ICD-10-CM | POA: Diagnosis not present

## 2020-12-08 DIAGNOSIS — R0600 Dyspnea, unspecified: Secondary | ICD-10-CM | POA: Diagnosis not present

## 2020-12-08 DIAGNOSIS — F17210 Nicotine dependence, cigarettes, uncomplicated: Secondary | ICD-10-CM | POA: Diagnosis not present

## 2020-12-08 DIAGNOSIS — R918 Other nonspecific abnormal finding of lung field: Secondary | ICD-10-CM | POA: Diagnosis not present

## 2020-12-11 DIAGNOSIS — J449 Chronic obstructive pulmonary disease, unspecified: Secondary | ICD-10-CM | POA: Diagnosis not present

## 2020-12-13 DIAGNOSIS — G8929 Other chronic pain: Secondary | ICD-10-CM | POA: Diagnosis not present

## 2020-12-13 DIAGNOSIS — F32A Depression, unspecified: Secondary | ICD-10-CM | POA: Diagnosis not present

## 2020-12-13 DIAGNOSIS — M549 Dorsalgia, unspecified: Secondary | ICD-10-CM | POA: Diagnosis not present

## 2020-12-13 DIAGNOSIS — Z7689 Persons encountering health services in other specified circumstances: Secondary | ICD-10-CM | POA: Diagnosis not present

## 2020-12-19 DIAGNOSIS — J690 Pneumonitis due to inhalation of food and vomit: Secondary | ICD-10-CM | POA: Diagnosis not present

## 2020-12-19 DIAGNOSIS — J441 Chronic obstructive pulmonary disease with (acute) exacerbation: Secondary | ICD-10-CM | POA: Diagnosis not present

## 2020-12-19 DIAGNOSIS — J439 Emphysema, unspecified: Secondary | ICD-10-CM | POA: Diagnosis not present

## 2020-12-19 DIAGNOSIS — E46 Unspecified protein-calorie malnutrition: Secondary | ICD-10-CM | POA: Diagnosis not present

## 2020-12-19 DIAGNOSIS — Z743 Need for continuous supervision: Secondary | ICD-10-CM | POA: Diagnosis not present

## 2020-12-19 DIAGNOSIS — I700 Atherosclerosis of aorta: Secondary | ICD-10-CM | POA: Diagnosis not present

## 2020-12-19 DIAGNOSIS — R911 Solitary pulmonary nodule: Secondary | ICD-10-CM | POA: Diagnosis not present

## 2020-12-19 DIAGNOSIS — R0602 Shortness of breath: Secondary | ICD-10-CM | POA: Diagnosis not present

## 2020-12-19 DIAGNOSIS — R0902 Hypoxemia: Secondary | ICD-10-CM | POA: Diagnosis not present

## 2020-12-19 DIAGNOSIS — R531 Weakness: Secondary | ICD-10-CM | POA: Diagnosis not present

## 2020-12-19 DIAGNOSIS — J449 Chronic obstructive pulmonary disease, unspecified: Secondary | ICD-10-CM | POA: Diagnosis not present

## 2020-12-19 DIAGNOSIS — J90 Pleural effusion, not elsewhere classified: Secondary | ICD-10-CM | POA: Diagnosis not present

## 2020-12-19 DIAGNOSIS — R079 Chest pain, unspecified: Secondary | ICD-10-CM | POA: Diagnosis not present

## 2020-12-20 DIAGNOSIS — E46 Unspecified protein-calorie malnutrition: Secondary | ICD-10-CM | POA: Diagnosis not present

## 2020-12-20 DIAGNOSIS — F17210 Nicotine dependence, cigarettes, uncomplicated: Secondary | ICD-10-CM | POA: Diagnosis not present

## 2020-12-20 DIAGNOSIS — J439 Emphysema, unspecified: Secondary | ICD-10-CM | POA: Diagnosis not present

## 2020-12-20 DIAGNOSIS — Z79899 Other long term (current) drug therapy: Secondary | ICD-10-CM | POA: Diagnosis not present

## 2020-12-20 DIAGNOSIS — R911 Solitary pulmonary nodule: Secondary | ICD-10-CM | POA: Diagnosis not present

## 2020-12-20 DIAGNOSIS — J441 Chronic obstructive pulmonary disease with (acute) exacerbation: Secondary | ICD-10-CM | POA: Diagnosis not present

## 2020-12-20 DIAGNOSIS — Z8616 Personal history of COVID-19: Secondary | ICD-10-CM | POA: Diagnosis not present

## 2020-12-20 DIAGNOSIS — J449 Chronic obstructive pulmonary disease, unspecified: Secondary | ICD-10-CM | POA: Diagnosis not present

## 2020-12-20 DIAGNOSIS — Z881 Allergy status to other antibiotic agents status: Secondary | ICD-10-CM | POA: Diagnosis not present

## 2020-12-20 DIAGNOSIS — J690 Pneumonitis due to inhalation of food and vomit: Secondary | ICD-10-CM | POA: Diagnosis not present

## 2020-12-20 DIAGNOSIS — I700 Atherosclerosis of aorta: Secondary | ICD-10-CM | POA: Diagnosis not present

## 2020-12-20 DIAGNOSIS — Z681 Body mass index (BMI) 19 or less, adult: Secondary | ICD-10-CM | POA: Diagnosis not present

## 2020-12-20 DIAGNOSIS — M1990 Unspecified osteoarthritis, unspecified site: Secondary | ICD-10-CM | POA: Diagnosis not present

## 2020-12-20 DIAGNOSIS — R0602 Shortness of breath: Secondary | ICD-10-CM | POA: Diagnosis not present

## 2020-12-20 DIAGNOSIS — F32A Depression, unspecified: Secondary | ICD-10-CM | POA: Diagnosis not present

## 2020-12-20 DIAGNOSIS — R0902 Hypoxemia: Secondary | ICD-10-CM | POA: Diagnosis not present

## 2020-12-20 DIAGNOSIS — Z86711 Personal history of pulmonary embolism: Secondary | ICD-10-CM | POA: Diagnosis not present

## 2020-12-20 DIAGNOSIS — Z720 Tobacco use: Secondary | ICD-10-CM | POA: Diagnosis not present

## 2020-12-20 DIAGNOSIS — J90 Pleural effusion, not elsewhere classified: Secondary | ICD-10-CM | POA: Diagnosis not present

## 2020-12-20 DIAGNOSIS — R531 Weakness: Secondary | ICD-10-CM | POA: Diagnosis not present

## 2020-12-20 DIAGNOSIS — Z23 Encounter for immunization: Secondary | ICD-10-CM | POA: Diagnosis not present

## 2020-12-20 DIAGNOSIS — J9620 Acute and chronic respiratory failure, unspecified whether with hypoxia or hypercapnia: Secondary | ICD-10-CM | POA: Diagnosis not present

## 2020-12-20 DIAGNOSIS — R079 Chest pain, unspecified: Secondary | ICD-10-CM | POA: Diagnosis not present

## 2020-12-20 DIAGNOSIS — J440 Chronic obstructive pulmonary disease with acute lower respiratory infection: Secondary | ICD-10-CM | POA: Diagnosis not present

## 2020-12-20 DIAGNOSIS — R918 Other nonspecific abnormal finding of lung field: Secondary | ICD-10-CM | POA: Diagnosis not present

## 2020-12-21 DIAGNOSIS — J690 Pneumonitis due to inhalation of food and vomit: Secondary | ICD-10-CM | POA: Diagnosis not present

## 2020-12-21 DIAGNOSIS — J90 Pleural effusion, not elsewhere classified: Secondary | ICD-10-CM | POA: Diagnosis not present

## 2020-12-21 DIAGNOSIS — J441 Chronic obstructive pulmonary disease with (acute) exacerbation: Secondary | ICD-10-CM | POA: Diagnosis not present

## 2020-12-22 DIAGNOSIS — J449 Chronic obstructive pulmonary disease, unspecified: Secondary | ICD-10-CM | POA: Diagnosis not present

## 2020-12-28 DIAGNOSIS — R918 Other nonspecific abnormal finding of lung field: Secondary | ICD-10-CM | POA: Diagnosis not present

## 2020-12-28 DIAGNOSIS — R0600 Dyspnea, unspecified: Secondary | ICD-10-CM | POA: Diagnosis not present

## 2020-12-28 DIAGNOSIS — F17210 Nicotine dependence, cigarettes, uncomplicated: Secondary | ICD-10-CM | POA: Diagnosis not present

## 2020-12-29 DIAGNOSIS — G8929 Other chronic pain: Secondary | ICD-10-CM | POA: Diagnosis not present

## 2020-12-29 DIAGNOSIS — Z23 Encounter for immunization: Secondary | ICD-10-CM | POA: Diagnosis not present

## 2020-12-29 DIAGNOSIS — Z7689 Persons encountering health services in other specified circumstances: Secondary | ICD-10-CM | POA: Diagnosis not present

## 2020-12-29 DIAGNOSIS — J9610 Chronic respiratory failure, unspecified whether with hypoxia or hypercapnia: Secondary | ICD-10-CM | POA: Diagnosis not present

## 2020-12-29 DIAGNOSIS — J449 Chronic obstructive pulmonary disease, unspecified: Secondary | ICD-10-CM | POA: Diagnosis not present

## 2021-01-04 DIAGNOSIS — M47816 Spondylosis without myelopathy or radiculopathy, lumbar region: Secondary | ICD-10-CM | POA: Diagnosis not present

## 2021-01-04 DIAGNOSIS — Z1389 Encounter for screening for other disorder: Secondary | ICD-10-CM | POA: Diagnosis not present

## 2021-01-04 DIAGNOSIS — G894 Chronic pain syndrome: Secondary | ICD-10-CM | POA: Diagnosis not present

## 2021-01-04 DIAGNOSIS — M549 Dorsalgia, unspecified: Secondary | ICD-10-CM | POA: Diagnosis not present

## 2021-01-04 DIAGNOSIS — M5136 Other intervertebral disc degeneration, lumbar region: Secondary | ICD-10-CM | POA: Diagnosis not present

## 2021-01-06 DIAGNOSIS — J449 Chronic obstructive pulmonary disease, unspecified: Secondary | ICD-10-CM | POA: Diagnosis not present

## 2021-01-06 DIAGNOSIS — J9610 Chronic respiratory failure, unspecified whether with hypoxia or hypercapnia: Secondary | ICD-10-CM | POA: Diagnosis not present

## 2021-01-10 DIAGNOSIS — J449 Chronic obstructive pulmonary disease, unspecified: Secondary | ICD-10-CM | POA: Diagnosis not present

## 2021-01-12 DIAGNOSIS — Z23 Encounter for immunization: Secondary | ICD-10-CM | POA: Diagnosis not present

## 2021-01-12 DIAGNOSIS — S39012A Strain of muscle, fascia and tendon of lower back, initial encounter: Secondary | ICD-10-CM | POA: Diagnosis not present

## 2021-01-15 DIAGNOSIS — M47816 Spondylosis without myelopathy or radiculopathy, lumbar region: Secondary | ICD-10-CM | POA: Diagnosis not present

## 2021-01-15 DIAGNOSIS — M549 Dorsalgia, unspecified: Secondary | ICD-10-CM | POA: Diagnosis not present

## 2021-01-15 DIAGNOSIS — G894 Chronic pain syndrome: Secondary | ICD-10-CM | POA: Diagnosis not present

## 2021-01-15 DIAGNOSIS — M5136 Other intervertebral disc degeneration, lumbar region: Secondary | ICD-10-CM | POA: Diagnosis not present

## 2021-01-21 ENCOUNTER — Encounter (HOSPITAL_COMMUNITY): Payer: Self-pay | Admitting: Emergency Medicine

## 2021-01-21 ENCOUNTER — Emergency Department (HOSPITAL_COMMUNITY): Payer: Medicare Other

## 2021-01-21 ENCOUNTER — Other Ambulatory Visit: Payer: Self-pay

## 2021-01-21 ENCOUNTER — Inpatient Hospital Stay (HOSPITAL_COMMUNITY)
Admission: EM | Admit: 2021-01-21 | Discharge: 2021-01-26 | DRG: 193 | Disposition: A | Discharge status: Home / Self Care | Payer: Medicare Other | Attending: Internal Medicine | Admitting: Internal Medicine

## 2021-01-21 DIAGNOSIS — J90 Pleural effusion, not elsewhere classified: Secondary | ICD-10-CM | POA: Diagnosis not present

## 2021-01-21 DIAGNOSIS — J440 Chronic obstructive pulmonary disease with acute lower respiratory infection: Secondary | ICD-10-CM | POA: Diagnosis not present

## 2021-01-21 DIAGNOSIS — Z79899 Other long term (current) drug therapy: Secondary | ICD-10-CM | POA: Diagnosis not present

## 2021-01-21 DIAGNOSIS — W11XXXD Fall on and from ladder, subsequent encounter: Secondary | ICD-10-CM | POA: Diagnosis present

## 2021-01-21 DIAGNOSIS — Z681 Body mass index (BMI) 19 or less, adult: Secondary | ICD-10-CM

## 2021-01-21 DIAGNOSIS — J449 Chronic obstructive pulmonary disease, unspecified: Secondary | ICD-10-CM | POA: Diagnosis not present

## 2021-01-21 DIAGNOSIS — J189 Pneumonia, unspecified organism: Principal | ICD-10-CM | POA: Diagnosis present

## 2021-01-21 DIAGNOSIS — E43 Unspecified severe protein-calorie malnutrition: Secondary | ICD-10-CM | POA: Diagnosis present

## 2021-01-21 DIAGNOSIS — R0600 Dyspnea, unspecified: Secondary | ICD-10-CM | POA: Diagnosis not present

## 2021-01-21 DIAGNOSIS — Z8701 Personal history of pneumonia (recurrent): Secondary | ICD-10-CM

## 2021-01-21 DIAGNOSIS — R911 Solitary pulmonary nodule: Secondary | ICD-10-CM | POA: Diagnosis not present

## 2021-01-21 DIAGNOSIS — S2242XD Multiple fractures of ribs, left side, subsequent encounter for fracture with routine healing: Secondary | ICD-10-CM

## 2021-01-21 DIAGNOSIS — Z888 Allergy status to other drugs, medicaments and biological substances status: Secondary | ICD-10-CM | POA: Diagnosis not present

## 2021-01-21 DIAGNOSIS — F419 Anxiety disorder, unspecified: Secondary | ICD-10-CM | POA: Diagnosis present

## 2021-01-21 DIAGNOSIS — E039 Hypothyroidism, unspecified: Secondary | ICD-10-CM | POA: Diagnosis not present

## 2021-01-21 DIAGNOSIS — Z881 Allergy status to other antibiotic agents status: Secondary | ICD-10-CM | POA: Diagnosis not present

## 2021-01-21 DIAGNOSIS — D509 Iron deficiency anemia, unspecified: Secondary | ICD-10-CM | POA: Diagnosis not present

## 2021-01-21 DIAGNOSIS — F329 Major depressive disorder, single episode, unspecified: Secondary | ICD-10-CM | POA: Diagnosis present

## 2021-01-21 DIAGNOSIS — R0789 Other chest pain: Secondary | ICD-10-CM | POA: Diagnosis not present

## 2021-01-21 DIAGNOSIS — G894 Chronic pain syndrome: Secondary | ICD-10-CM | POA: Diagnosis not present

## 2021-01-21 DIAGNOSIS — Z20822 Contact with and (suspected) exposure to covid-19: Secondary | ICD-10-CM | POA: Diagnosis not present

## 2021-01-21 DIAGNOSIS — F418 Other specified anxiety disorders: Secondary | ICD-10-CM | POA: Diagnosis present

## 2021-01-21 DIAGNOSIS — F17210 Nicotine dependence, cigarettes, uncomplicated: Secondary | ICD-10-CM | POA: Diagnosis not present

## 2021-01-21 DIAGNOSIS — R079 Chest pain, unspecified: Secondary | ICD-10-CM | POA: Diagnosis not present

## 2021-01-21 DIAGNOSIS — M1990 Unspecified osteoarthritis, unspecified site: Secondary | ICD-10-CM | POA: Diagnosis present

## 2021-01-21 DIAGNOSIS — J439 Emphysema, unspecified: Secondary | ICD-10-CM | POA: Diagnosis not present

## 2021-01-21 DIAGNOSIS — Z8614 Personal history of Methicillin resistant Staphylococcus aureus infection: Secondary | ICD-10-CM

## 2021-01-21 LAB — CBC WITH DIFFERENTIAL/PLATELET
Abs Immature Granulocytes: 0.04 10*3/uL (ref 0.00–0.07)
Basophils Absolute: 0.1 10*3/uL (ref 0.0–0.1)
Basophils Relative: 1 %
Eosinophils Absolute: 0.2 10*3/uL (ref 0.0–0.5)
Eosinophils Relative: 2 %
HCT: 42.1 % (ref 39.0–52.0)
Hemoglobin: 13.8 g/dL (ref 13.0–17.0)
Immature Granulocytes: 0 %
Lymphocytes Relative: 26 %
Lymphs Abs: 2.3 10*3/uL (ref 0.7–4.0)
MCH: 30.3 pg (ref 26.0–34.0)
MCHC: 32.8 g/dL (ref 30.0–36.0)
MCV: 92.5 fL (ref 80.0–100.0)
Monocytes Absolute: 0.8 10*3/uL (ref 0.1–1.0)
Monocytes Relative: 9 %
Neutro Abs: 5.6 10*3/uL (ref 1.7–7.7)
Neutrophils Relative %: 62 %
Platelets: 426 10*3/uL — ABNORMAL HIGH (ref 150–400)
RBC: 4.55 MIL/uL (ref 4.22–5.81)
RDW: 13.6 % (ref 11.5–15.5)
WBC: 9 10*3/uL (ref 4.0–10.5)
nRBC: 0 % (ref 0.0–0.2)

## 2021-01-21 LAB — RESP PANEL BY RT-PCR (FLU A&B, COVID) ARPGX2
Influenza A by PCR: NEGATIVE
Influenza B by PCR: NEGATIVE
SARS Coronavirus 2 by RT PCR: NEGATIVE

## 2021-01-21 LAB — RAPID URINE DRUG SCREEN, HOSP PERFORMED
Amphetamines: NOT DETECTED
Barbiturates: NOT DETECTED
Benzodiazepines: NOT DETECTED
Cocaine: NOT DETECTED
Opiates: NOT DETECTED
Tetrahydrocannabinol: NOT DETECTED

## 2021-01-21 LAB — COMPREHENSIVE METABOLIC PANEL
ALT: 21 U/L (ref 0–44)
AST: 26 U/L (ref 15–41)
Albumin: 3.4 g/dL — ABNORMAL LOW (ref 3.5–5.0)
Alkaline Phosphatase: 86 U/L (ref 38–126)
Anion gap: 10 (ref 5–15)
BUN: 22 mg/dL (ref 8–23)
CO2: 27 mmol/L (ref 22–32)
Calcium: 9.6 mg/dL (ref 8.9–10.3)
Chloride: 101 mmol/L (ref 98–111)
Creatinine, Ser: 0.72 mg/dL (ref 0.61–1.24)
GFR, Estimated: >60 mL/min (ref >60)
Glucose, Bld: 93 mg/dL (ref 70–99)
Potassium: 3.9 mmol/L (ref 3.5–5.1)
Sodium: 138 mmol/L (ref 135–145)
Total Bilirubin: 0.6 mg/dL (ref 0.3–1.2)
Total Protein: 7.6 g/dL (ref 6.5–8.1)

## 2021-01-21 LAB — LACTIC ACID, PLASMA: Lactic Acid, Venous: 1 mmol/L (ref 0.5–1.9)

## 2021-01-21 LAB — TROPONIN I (HIGH SENSITIVITY)
Troponin I (High Sensitivity): 6 ng/L (ref <18)
Troponin I (High Sensitivity): 5 ng/L (ref <18)

## 2021-01-21 LAB — ETHANOL: Alcohol, Ethyl (B): <10 mg/dL (ref <10)

## 2021-01-21 MED ORDER — ENOXAPARIN SODIUM 40 MG/0.4ML IJ SOSY
40.0000 mg | PREFILLED_SYRINGE | INTRAMUSCULAR | Status: DC
  Administered 2021-01-22 – 2021-01-25 (×5): 40 mg via SUBCUTANEOUS
  Filled 2021-01-21 (×5): qty 0.4

## 2021-01-21 MED ORDER — VANCOMYCIN HCL IN DEXTROSE 1-5 GM/200ML-% IV SOLN
1000.0000 mg | Freq: Once | INTRAVENOUS | Status: AC
  Administered 2021-01-21: 1000 mg via INTRAVENOUS
  Filled 2021-01-21: qty 200

## 2021-01-21 MED ORDER — GABAPENTIN 300 MG PO CAPS
300.0000 mg | ORAL_CAPSULE | Freq: Three times a day (TID) | ORAL | Status: DC
  Administered 2021-01-22 – 2021-01-26 (×14): 300 mg via ORAL
  Filled 2021-01-21 (×14): qty 1

## 2021-01-21 MED ORDER — BUSPIRONE HCL 10 MG PO TABS
5.0000 mg | ORAL_TABLET | Freq: Three times a day (TID) | ORAL | Status: DC
  Filled 2021-01-21 (×4): qty 1

## 2021-01-21 MED ORDER — MOMETASONE FURO-FORMOTEROL FUM 200-5 MCG/ACT IN AERO
2.0000 | INHALATION_SPRAY | Freq: Two times a day (BID) | RESPIRATORY_TRACT | Status: DC
  Administered 2021-01-22 – 2021-01-26 (×7): 2 via RESPIRATORY_TRACT
  Filled 2021-01-21 (×2): qty 8.8

## 2021-01-21 MED ORDER — CITALOPRAM HYDROBROMIDE 20 MG PO TABS
20.0000 mg | ORAL_TABLET | Freq: Every day | ORAL | Status: DC
  Administered 2021-01-22 – 2021-01-26 (×5): 20 mg via ORAL
  Filled 2021-01-21 (×2): qty 1
  Filled 2021-01-21: qty 2
  Filled 2021-01-21 (×2): qty 1

## 2021-01-21 MED ORDER — SODIUM CHLORIDE 0.9 % IV SOLN
2.0000 g | Freq: Three times a day (TID) | INTRAVENOUS | Status: DC
  Administered 2021-01-21 – 2021-01-24 (×9): 2 g via INTRAVENOUS
  Filled 2021-01-21 (×9): qty 2

## 2021-01-21 MED ORDER — IPRATROPIUM-ALBUTEROL 0.5-2.5 (3) MG/3ML IN SOLN
3.0000 mL | Freq: Four times a day (QID) | RESPIRATORY_TRACT | Status: DC
  Administered 2021-01-22 – 2021-01-23 (×5): 3 mL via RESPIRATORY_TRACT
  Filled 2021-01-21 (×5): qty 3

## 2021-01-21 MED ORDER — TRAZODONE HCL 50 MG PO TABS
25.0000 mg | ORAL_TABLET | Freq: Every evening | ORAL | Status: DC | PRN
  Administered 2021-01-22 – 2021-01-24 (×3): 50 mg via ORAL
  Filled 2021-01-21 (×3): qty 1

## 2021-01-21 MED ORDER — ONDANSETRON HCL 4 MG/2ML IJ SOLN: 4.0000 mg | Freq: Four times a day (QID) | INTRAMUSCULAR | Status: DC | PRN

## 2021-01-21 MED ORDER — PROCHLORPERAZINE EDISYLATE 10 MG/2ML IJ SOLN
10.0000 mg | Freq: Once | INTRAMUSCULAR | Status: AC
  Administered 2021-01-21: 10 mg via INTRAVENOUS
  Filled 2021-01-21: qty 2

## 2021-01-21 MED ORDER — VANCOMYCIN HCL 500 MG/100ML IV SOLN
500.0000 mg | Freq: Two times a day (BID) | INTRAVENOUS | Status: DC
  Administered 2021-01-22 – 2021-01-24 (×5): 500 mg via INTRAVENOUS
  Filled 2021-01-21 (×7): qty 100

## 2021-01-21 MED ORDER — ACETAMINOPHEN 650 MG RE SUPP: 650.0000 mg | Freq: Four times a day (QID) | RECTAL | Status: DC | PRN

## 2021-01-21 MED ORDER — TRAMADOL HCL 50 MG PO TABS
100.0000 mg | ORAL_TABLET | Freq: Three times a day (TID) | ORAL | Status: DC | PRN
  Administered 2021-01-22 – 2021-01-25 (×8): 100 mg via ORAL
  Filled 2021-01-21 (×8): qty 2

## 2021-01-21 MED ORDER — ONDANSETRON HCL 4 MG PO TABS: 4.0000 mg | ORAL_TABLET | Freq: Four times a day (QID) | ORAL | Status: DC | PRN

## 2021-01-21 MED ORDER — LACTATED RINGERS IV BOLUS
1000.0000 mL | Freq: Once | INTRAVENOUS | Status: AC
  Administered 2021-01-21: 1000 mL via INTRAVENOUS

## 2021-01-21 MED ORDER — DIPHENHYDRAMINE HCL 50 MG/ML IJ SOLN
25.0000 mg | Freq: Once | INTRAMUSCULAR | Status: AC
  Administered 2021-01-21: 25 mg via INTRAVENOUS
  Filled 2021-01-21: qty 1

## 2021-01-21 MED ORDER — ACETAMINOPHEN 500 MG PO TABS
1000.0000 mg | ORAL_TABLET | Freq: Four times a day (QID) | ORAL | Status: DC | PRN
  Administered 2021-01-23 – 2021-01-25 (×3): 1000 mg via ORAL
  Filled 2021-01-21 (×4): qty 2

## 2021-01-21 MED ORDER — BOOST PO LIQD
237.0000 mL | Freq: Three times a day (TID) | ORAL | Status: DC
  Administered 2021-01-22 – 2021-01-26 (×9): 237 mL via ORAL
  Filled 2021-01-21 (×15): qty 237

## 2021-01-21 MED ORDER — ACETAMINOPHEN 500 MG PO TABS
1000.0000 mg | ORAL_TABLET | Freq: Once | ORAL | Status: AC
  Administered 2021-01-21: 1000 mg via ORAL
  Filled 2021-01-21: qty 2

## 2021-01-21 MED ORDER — IOHEXOL 350 MG/ML SOLN
64.0000 mL | Freq: Once | INTRAVENOUS | Status: AC | PRN
  Administered 2021-01-21: 64 mL via INTRAVENOUS

## 2021-01-21 MED ORDER — NICOTINE 14 MG/24HR TD PT24
14.0000 mg | MEDICATED_PATCH | Freq: Every day | TRANSDERMAL | Status: DC
  Administered 2021-01-22 – 2021-01-26 (×6): 14 mg via TRANSDERMAL
  Filled 2021-01-21 (×6): qty 1

## 2021-01-21 MED ORDER — ALBUTEROL SULFATE (2.5 MG/3ML) 0.083% IN NEBU: 2.5000 mg | INHALATION_SOLUTION | Freq: Four times a day (QID) | RESPIRATORY_TRACT | Status: DC | PRN

## 2021-01-21 NOTE — H&P (Signed)
History and Physical    [name] HAL:[id number] DOB: Apr 29, 1950 DOA: 01/21/2021  PCP: Maryella Shivers, MD  Patient coming from: Home  I have personally briefly reviewed patient's old medical records in [place]  Chief Complaint: Chest tightness, shortness of breath  HPI: [name] is a 70 y.o. male with medical history significant for COPD, depression, prolonged hospital stay earlier this year after trauma complicated by multiple rib fractures with pneumothorax requiring temporary chest tube placement and subsequent readmission for MRSA pneumonia/bacteremia who presents to the ED for evaluation of shortness of breath.  Patient has been having physical deterioration since he fell off a ladder on June 30 resulting in multiple left-sided rib fractures.  This was complicated by pneumothorax requiring hospitalization and temporary chest tube placement which was since removed.  He was subsequently readmitted at [facility] 10/26/2020-11/01/2020 with pneumonia and was found to have MRSA bacteremia.  He was discharged to SNF but then admitted to [facility] 11/05/2020-11/08/2020 with acute on chronic hypoxemic respiratory failure due to MRSA pneumonia.  He completed full course of antibiotics with vancomycin and discharged back to SNF.  Patient states over the last month he has had continued shortness of breath with minimal exertion, occasional nonproductive cough, subjective fevers, chills, diaphoresis.  He has had poor appetite.  He reports approximately 40 pound weight loss since June 30.  He has continued pain related to his left-sided rib fractures and chronic low back pain (s/p lumbar disc surgery 2005).  He says he has been feeling more depressed due to his medical problems.  He says his PCP recently adjusted his medications to buspirone but patient does not feel this has been helping.  He denies any suicidal or homicidal ideation.  He does state that he got in a bad  argument with his daughter today prior to coming to the hospital.  ED Course:  Initial vitals showed BP 100/77, pulse 87, RR 17, temp 97.8 F, SPO2 97% on room air.  Labs show WBC 9.0, hemoglobin 13.8, platelets 426,000, sodium 138, potassium 3.9, bicarb 27, BUN 22, creatinine 0.72, serum glucose 93, LFTs within normal limits, serum ethanol <10, high-sensitivity troponin 6 > 5.  Blood cultures ordered (1 collected, 1 pending collection), UDS in process, SARS-CoV-2 PCR panel in process.  2 view chest x-ray showed severe emphysematous changes with persistent consolidation in the right lower lobe with right effusion.  Bilateral upper lobe scarring with nodular density in the left upper lung seen.  CTA chest PE study was negative for evidence of PE.  Severe emphysema and scarring again noted and appears stable.  Continue consolidation in the right lower lobe with small right effusion noted.  Stable 12 mm nodule in the superior segment of the left lower lobe seen.  Patient was given 1 L LR and started on IV vancomycin and cefepime.  Hospitalist service was consulted to admit for further evaluation and management.  Review of Systems: All systems reviewed and are negative except as documented in history of present illness above.   Past Medical History:  Diagnosis Date   Anxiety    Arthritis    Cataract    COPD (chronic obstructive pulmonary disease) ([place])    inhaler   Depression    Neuromuscular disorder ([place])    bulding disc    Past Surgical History:  Procedure Laterality Date   BACK SURGERY     LUMBAR DISC SURGERY Left 06/2003   L4 on L3 hemilaminectomy, foraminotomy for 3-4,4-5  with left L3-L4 extraforaminal diskectomy/notes 08/22/2010    REPAIR DURAL / CSF LEAK  07/2003   Archie Endo 08/22/2010    Social History:  reports that he has been smoking cigarettes. He has a 64.50 pack-year smoking history. He has never used smokeless tobacco. He reports that he does not currently use alcohol  after a past usage of about 6.0 standard drinks per week. He reports that he does not use drugs.  Allergies  Allergen Reactions   Erythromycin Base Other (See Comments)    hallucinations   Mirtazapine Other (See Comments)   Nsaids Other (See Comments)    Family History  Problem Relation Age of Onset   Diabetes Father    Colon polyps Father    Heart disease Father    Stroke Father    Heart failure Father    Diabetes Brother    Muscular dystrophy Mother    Diabetes Paternal Uncle    Colon cancer Paternal Grandfather    Diabetes Son      Prior to Admission medications   Medication Sig Start Date End Date Taking? Authorizing Provider  albuterol (PROVENTIL) (2.5 MG/3ML) 0.083% nebulizer solution Take 2.5 mg by nebulization every 2 (two) hours as needed for wheezing or shortness of breath.    [provider]  citalopram (CELEXA) 40 MG tablet Take 40 mg by mouth daily.    [provider]  clonazePAM (KLONOPIN) 1 MG tablet Take 1 tablet (1 mg total) by mouth 2 (two) times daily. 11/08/20   Barb Merino, MD  fluticasone-salmeterol (ADVAIR) 500-50 MCG/ACT AEPB Inhale 1 puff into the lungs in the morning and at bedtime.    [provider]  gabapentin (NEURONTIN) 300 MG capsule Take 300 mg by mouth 3 (three) times daily.    [provider]  lactose free nutrition (BOOST) LIQD Take 237 mLs by mouth 3 (three) times daily between meals.    [provider]  magic mouthwash w/lidocaine SOLN Take 5 mLs by mouth 4 (four) times daily. Swish and swallow    [provider]  magnesium oxide (MAG-OX) 400 MG tablet Take 400 mg by mouth daily.    [provider]  nicotine (NICODERM CQ - DOSED IN MG/24 HOURS) 21 mg/24hr patch Place 21 mg onto the skin every morning.    [provider]  Nutritional Supplement LIQD Take by mouth 3 (three) times daily with meals. Magic Cup    [provider]  potassium chloride (KLOR-CON) 10 MEQ  tablet Take 10 mEq by mouth daily.    [provider]  promethazine (PHENERGAN) 25 MG tablet Take 25 mg by mouth every 8 (eight) hours as needed for nausea or vomiting.    [provider]  traZODone (DESYREL) 50 MG tablet Take 50 mg by mouth at bedtime as needed for sleep. 02/13/20   [provider]    Physical Exam: Vitals:   01/21/21 1900 01/21/21 1915 01/21/21 1930 01/21/21 2015  BP: 115/69 116/84 95/82 120/72  Pulse: 78 73 75 72  Resp:   16   Temp:      TempSrc:      SpO2: 100% 100% 99% 96%   Constitutional: Cachectic man resting in bed with head elevated, NAD, calm, comfortable Eyes: PERRL, lids and conjunctivae normal ENMT: Mucous membranes are dry. Posterior pharynx clear of any exudate or lesions.Normal dentition.  Neck: normal, supple, no masses. Respiratory: Inspiratory crackles right lower lung field otherwise distant breath sounds which are clear to auscultation. Normal respiratory  effort. No accessory muscle use.  Cardiovascular: Regular rate and rhythm, no murmurs / rubs / gallops. No extremity edema. 2+ pedal pulses. Abdomen: no tenderness, no masses palpated. No hepatosplenomegaly. Bowel sounds positive.  Musculoskeletal: no clubbing / cyanosis. No joint deformity upper and lower extremities. Good ROM, no contractures.  Muscle wasting throughout. Skin: no rashes, lesions, ulcers. No induration Neurologic: CN 2-12 grossly intact. Sensation intact. Strength 5/5 in all 4.  Psychiatric: Normal judgment and insight. Alert and oriented x 3.  Mood is depressed.  Denies suicidal or homicidal ideation.  Denies auditory or visual hallucinations.  Labs on Admission: I have personally reviewed following labs and imaging studies  CBC: Recent Labs  Lab 01/21/21 1632  WBC 9.0  NEUTROABS 5.6  HGB 13.8  HCT 42.1  MCV 92.5  PLT 536*   Basic Metabolic Panel: Recent Labs  Lab 01/21/21 1632  NA 138  K 3.9  CL 101  CO2 27  GLUCOSE 93  BUN 22   CREATININE 0.72  CALCIUM 9.6   GFR: CrCl cannot be calculated (Unknown ideal weight.). Liver Function Tests: Recent Labs  Lab 01/21/21 1632  AST 26  ALT 21  ALKPHOS 86  BILITOT 0.6  PROT 7.6  ALBUMIN 3.4*   No results for input(s): LIPASE, AMYLASE in the last 168 hours. No results for input(s): AMMONIA in the last 168 hours. Coagulation Profile: No results for input(s): INR, PROTIME in the last 168 hours. Cardiac Enzymes: No results for input(s): CKTOTAL, CKMB, CKMBINDEX, TROPONINI in the last 168 hours. BNP (last 3 results) No results for input(s): PROBNP in the last 8760 hours. HbA1C: No results for input(s): HGBA1C in the last 72 hours. CBG: No results for input(s): GLUCAP in the last 168 hours. Lipid Profile: No results for input(s): CHOL, HDL, LDLCALC, TRIG, CHOLHDL, LDLDIRECT in the last 72 hours. Thyroid Function Tests: No results for input(s): TSH, T4TOTAL, FREET4, T3FREE, THYROIDAB in the last 72 hours. Anemia Panel: No results for input(s): VITAMINB12, FOLATE, FERRITIN, TIBC, IRON, RETICCTPCT in the last 72 hours. Urine analysis:    Component Value Date/Time   COLORURINE STRAW (A) 11/05/2020 1507   APPEARANCEUR CLEAR 11/05/2020 1507   LABSPEC 1.005 11/05/2020 1507   PHURINE 7.0 11/05/2020 1507   GLUCOSEU NEGATIVE 11/05/2020 1507   HGBUR NEGATIVE 11/05/2020 1507   BILIRUBINUR NEGATIVE 11/05/2020 1507   KETONESUR NEGATIVE 11/05/2020 1507   PROTEINUR NEGATIVE 11/05/2020 1507   UROBILINOGEN 0.2 04/12/2014 1240   NITRITE NEGATIVE 11/05/2020 1507   LEUKOCYTESUR NEGATIVE 11/05/2020 1507    Radiological Exams on Admission: DG Chest 2 View  Result Date: 01/21/2021 CLINICAL DATA:  Chest pain, difficulty breathing EXAM: CHEST - 2 VIEW COMPARISON:  12/19/2020 chest x-ray and chest CT. FINDINGS: Severe emphysema. Areas of scarring in the upper lobes. Nodular density in the left upper lung, shown on prior CT to be in the superior segment of the left lower lobe,  likely unchanged. Continued consolidation in the right lower lobe with small right pleural effusion. Heart is normal size. No acute bony abnormality. IMPRESSION: Severe COPD. Continued consolidation in the right lower lobe with right effusion, concerning for pneumonia. Bilateral upper lobe scarring. Nodular density in the left upper lung as seen on prior CT and likely stable. Recommend continued follow-up as recommended on prior chest CT. Electronically Signed   By: Rolm Baptise M.D.   On: 01/21/2021 17:03   CT Angio Chest PE W and/or Wo Contrast  Result Date: 01/21/2021 CLINICAL DATA:  Left chest tightness  EXAM: CT ANGIOGRAPHY CHEST WITH CONTRAST TECHNIQUE: Multidetector CT imaging of the chest was performed using the standard protocol during bolus administration of intravenous contrast. Multiplanar CT image reconstructions and MIPs were obtained to evaluate the vascular anatomy. CONTRAST:  65mL OMNIPAQUE IOHEXOL 350 MG/ML SOLN COMPARISON:  12/19/2020 FINDINGS: Cardiovascular: No filling defects in the pulmonary arteries to suggest pulmonary emboli. Heart is normal size. Aorta is normal caliber. Scattered aortic calcifications. Mediastinum/Nodes: No mediastinal, hilar, or axillary adenopathy. Trachea and esophagus are unremarkable. Thyroid unremarkable. Lungs/Pleura: Severe emphysema. Biapical scarring. 12 mm nodule in the superior segment of the left lower lobe is stable. Small right pleural effusion again noted with consolidation in the right lower lobe, stable since prior study and concerning for pneumonia. The UB Upper Abdomen: Imaging into the upper abdomen demonstrates no acute findings. Musculoskeletal: Chest wall soft tissues are unremarkable. No acute bony abnormality. Review of the MIP images confirms the above findings. IMPRESSION: No evidence of pulmonary embolus. Severe emphysema and scarring, stable. Stable 12 mm nodule in the superior segment of the left lower lobe. This could be further  evaluated with nuclear medicine PET CT. Continued consolidation in the right lower lobe with small right effusion. Findings concerning for pneumonia. Aortic Atherosclerosis (ICD10-I70.0) and Emphysema (ICD10-J43.9). Electronically Signed   By: Rolm Baptise M.D.   On: 01/21/2021 20:45    EKG: Personally reviewed. Normal sinus rhythm, motion artifact.  Similar to prior.  Assessment/Plan Principal Problem:   Right lower lobe pneumonia Active Problems:   COPD (chronic obstructive pulmonary disease) ([facility])   Depression with anxiety   [name] is a 70 y.o. male with medical history significant for COPD, depression, prolonged hospital stay earlier this year after trauma complicated by multiple rib fractures with pneumothorax requiring temporary chest tube placement and subsequent readmission for MRSA pneumonia/bacteremia who is admitted with persistent right lower lobe pneumonia.  Persistent right lower lobe consolidation/pneumonia with effusion Recent history MRSA pneumonia/bacteremia: CT imaging shows persistent right lower lobe consolidation with small effusion.  This was also seen on outpatient PET scan 9/1 and present on multiple chest imaging modalities going back to mid July.  Will continue broad-spectrum antibiotics however need to consider underlying malignant process given persistent findings associated with significant weight loss and tobacco history.  Does not meet sepsis criteria on admission. -Continue IV vancomycin and cefepime -Follow blood cultures -Sputum culture, strep pneumonia and Legionella urinary antigens -SLP eval to assess for potential aspiration  COPD: Currently stable and saturating well on room air. Continue Dulera, duo nebs, as needed albuterol.  Depression/anxiety: Reports worsening depressed mood without suicidal or homicidal ideation.  Continue BuSpar.  May benefit from psychiatry evaluation while in hospital.  Chronic pain syndrome: Continue tramadol as  needed, gabapentin.  Left lower lobe pulmonary nodule: Stable 12 mm nodule in the superior segment of the left lower lobe noted on CTA.  Patient did undergo PET scan 12/07/2020 (report in Care Everywhere/images in PACS), read as "LEFT pulmonary nodule with uptake less than cardiac blood pool on the current study. Findings suggest indolent process. Indolent bronchogenic neoplasm remains a differential consideration."   Tobacco use: Reports cutting back from 2 pack/day to quarter pack per day.  Nicotine patch ordered.  DVT prophylaxis: Lovenox Code Status: Full code Family Communication: Discussed with patient, he has discussed with his close friend Disposition Plan: From home, dispo pending clinical progress Consults called: None Level of care: Med-Surg Admission status:  Status is: Observation  The patient remains OBS appropriate and  will d/c before 2 midnights.   Zada Finders MD [facility]  If 7PM-7AM, please contact night-coverage [email or web address]  01/21/2021, 10:12 PM

## 2021-01-21 NOTE — ED Notes (Addendum)
Pt changed into burgundy scrubs and security notified to wand pt.  Belongings placed in locker #7 by NT.
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
## 2021-01-21 NOTE — Progress Notes (Signed)
Pharmacy Antibiotic Note  [name] is a 70 y.o. male admitted on 01/21/2021 presenting with CP, nausea, SOB.  Pharmacy has been consulted for vancomycin and cefepime dosing.  Plan: Vancomycin 1000 mg IV x 1, then 500 mg IV q 12h (eAUC 500, Goal AUC 400-550, SCr used 0.8) Add MRSA PCR Cefepime 2g IV q 8h Monitor renal function, PCR/Cx to narrow Vancomycin levels as needed      Temp (24hrs), Avg:97.8 F (36.6 C), Min:97.8 F (36.6 C), Max:97.8 F (36.6 C)  Recent Labs  Lab 01/21/21 1632  WBC 9.0  CREATININE 0.72    CrCl cannot be calculated (Unknown ideal weight.).    Allergies  Allergen Reactions   Erythromycin Base Other (See Comments)    hallucinations   Mirtazapine Other (See Comments)   Nsaids Other (See Comments)    Bertis Ruddy, PharmD Clinical Pharmacist ED Pharmacist Phone # [phone] 01/21/2021 6:49 PM
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
## 2021-01-21 NOTE — ED Notes (Signed)
Added contact named Foxx to chart, patient states he is his friend and would like to notify him that "he is alright". This contact would like to be notified about his admission.

## 2021-01-22 DIAGNOSIS — J189 Pneumonia, unspecified organism: Secondary | ICD-10-CM | POA: Diagnosis not present

## 2021-01-22 LAB — CBC
HCT: 35.1 % — ABNORMAL LOW (ref 39.0–52.0)
Hemoglobin: 11.6 g/dL — ABNORMAL LOW (ref 13.0–17.0)
MCH: 30.8 pg (ref 26.0–34.0)
MCHC: 33 g/dL (ref 30.0–36.0)
MCV: 93.1 fL (ref 80.0–100.0)
Platelets: 219 10*3/uL (ref 150–400)
RBC: 3.77 MIL/uL — ABNORMAL LOW (ref 4.22–5.81)
RDW: 13.7 % (ref 11.5–15.5)
WBC: 5.4 10*3/uL (ref 4.0–10.5)
nRBC: 0 % (ref 0.0–0.2)

## 2021-01-22 LAB — MAGNESIUM: Magnesium: 1.9 mg/dL (ref 1.7–2.4)

## 2021-01-22 LAB — PHOSPHORUS: Phosphorus: 3.5 mg/dL (ref 2.5–4.6)

## 2021-01-22 LAB — BASIC METABOLIC PANEL
Anion gap: 9 (ref 5–15)
BUN: 18 mg/dL (ref 8–23)
CO2: 22 mmol/L (ref 22–32)
Calcium: 8.3 mg/dL — ABNORMAL LOW (ref 8.9–10.3)
Chloride: 103 mmol/L (ref 98–111)
Creatinine, Ser: 0.59 mg/dL — ABNORMAL LOW (ref 0.61–1.24)
GFR, Estimated: >60 mL/min (ref >60)
Glucose, Bld: 109 mg/dL — ABNORMAL HIGH (ref 70–99)
Potassium: 3 mmol/L — ABNORMAL LOW (ref 3.5–5.1)
Sodium: 134 mmol/L — ABNORMAL LOW (ref 135–145)

## 2021-01-22 LAB — MRSA NEXT GEN BY PCR, NASAL: MRSA by PCR Next Gen: DETECTED — AB

## 2021-01-22 LAB — PROCALCITONIN: Procalcitonin: <0.1 ng/mL

## 2021-01-22 MED ORDER — GUAIFENESIN ER 600 MG PO TB12
600.0000 mg | ORAL_TABLET | Freq: Two times a day (BID) | ORAL | Status: DC
  Administered 2021-01-22 – 2021-01-23 (×3): 600 mg via ORAL
  Filled 2021-01-22 (×3): qty 1

## 2021-01-22 MED ORDER — POTASSIUM CHLORIDE CRYS ER 20 MEQ PO TBCR
40.0000 meq | EXTENDED_RELEASE_TABLET | Freq: Once | ORAL | Status: AC
  Administered 2021-01-22: 40 meq via ORAL
  Filled 2021-01-22: qty 2

## 2021-01-22 NOTE — ED Notes (Signed)
Pt was explained the process of being admitted, hospitalized in the ED for monitoring until he can get a bed assigned & what his sitter is for. Pt was very anxious regarding his situation & surroundings. Sitter aware, this RN will monitor.

## 2021-01-22 NOTE — Progress Notes (Signed)
PROGRESS NOTE    [name]  KWI:[id number] DOB: 1950/04/24 DOA: 01/21/2021 PCP: Maryella Shivers, MD   Brief Narrative: 70 year old with past medical history significant for COPD, depression, previous prolonged hospital stay after trauma complicated by multiple rib fractures with pneumothorax requiring temporary chest tube, subsequent readmission for MRSA pneumonia bacteremia who presents to the ED for further evaluation of shortness of breath. CTA negative for PE, continued consolidation in the right lower lobe with a small effusion.  Stable 12 mm nodule in the superior segment of the left lower lobe.     Assessment & Plan:   Principal Problem:   Right lower lobe pneumonia Active Problems:   COPD (chronic obstructive pulmonary disease) (HCC)   Depression with anxiety  1-Persistent right lower lobe consolidation/pneumonia with effusion recent history of MRSA pneumonia bacteremia Continue with IV vancomycin and cefepime Follow-up blood cultures Patient had swallow evaluation For SOB , will check 2D echo. Pet scan 9/22: (care everywhere) LEFT pulmonary nodule with uptake less than cardiac blood pool on  the current study. Findings suggest indolent process. Indolent  bronchogenic neoplasm remains a differential consideration. RIGHT lower lobe pneumonia. Material in RIGHT lower lobe bronchi  suggest aspiration, associated with small to moderate RIGHT-sided  pleural effusion.  He will need follow up with pulmonologist.   2-COPD: Continue with Ruthe Mannan and DuoNeb  3-Depression anxiety: Continue with BuSpar Chronic pain syndrome: Continue with tramadol as needed and gabapentin Left lower lobe pulmonary nodule: Asked to follow-up with pulmonologist  Tobacco use: Counseling provided  Estimated body mass index is 13.84 kg/m as calculated from the following:   Height as of 09/03/19: 6\' 1"  (1.854 m).   Weight as of 11/08/20: 47.6 kg.   DVT prophylaxis: Lovenox Code Status: Full  code Family Communication: Care discussed with patient Disposition Plan:  Status is: Observation  The patient remains OBS appropriate and will d/c before 2 midnights.       Consultants:  None  Procedures:  ECHO  Antimicrobials:    Subjective: He is alert, report dyspnea improved.    Objective: Vitals:   01/22/21 0530 01/22/21 0600 01/22/21 0621 01/22/21 0715  BP: 101/66 106/67 113/72 101/64  Pulse: 64 64 65 65  Resp:   18   Temp:   (!) 97.5 F (36.4 C)   TempSrc:   Oral   SpO2: 98% 100% 100% 100%    Intake/Output Summary (Last 24 hours) at 01/22/2021 0844 Last data filed at 01/21/2021 2310 Gross per 24 hour  Intake 1209.94 ml  Output --  Net 1209.94 ml   There were no vitals filed for this visit.  Examination:  General exam: Appears calm and comfortable  Respiratory system: right crackles, ronchus Cardiovascular system: S1 & S2 heard, RRR. No JVD, murmurs, rubs, gallops or clicks. No pedal edema. Gastrointestinal system: Abdomen is nondistended, soft and nontender. No organomegaly or masses felt. Normal bowel sounds heard. Central nervous system: Alert and oriented. No focal neurological deficits.   Data Reviewed: I have personally reviewed following labs and imaging studies  CBC: Recent Labs  Lab 01/21/21 1632 01/22/21 0604  WBC 9.0 5.4  NEUTROABS 5.6  --   HGB 13.8 11.6*  HCT 42.1 35.1*  MCV 92.5 93.1  PLT 426* 242   Basic Metabolic Panel: Recent Labs  Lab 01/21/21 1632 01/22/21 0604  NA 138 134*  K 3.9 3.0*  CL 101 103  CO2 27 22  GLUCOSE 93 109*  BUN 22 18  CREATININE 0.72 0.59*  CALCIUM  9.6 8.3*  MG  --  1.9  PHOS  --  3.5   GFR: CrCl cannot be calculated (Unknown ideal weight.). Liver Function Tests: Recent Labs  Lab 01/21/21 1632  AST 26  ALT 21  ALKPHOS 86  BILITOT 0.6  PROT 7.6  ALBUMIN 3.4*   No results for input(s): LIPASE, AMYLASE in the last 168 hours. No results for input(s): AMMONIA in the last 168  hours. Coagulation Profile: No results for input(s): INR, PROTIME in the last 168 hours. Cardiac Enzymes: No results for input(s): CKTOTAL, CKMB, CKMBINDEX, TROPONINI in the last 168 hours. BNP (last 3 results) No results for input(s): PROBNP in the last 8760 hours. HbA1C: No results for input(s): HGBA1C in the last 72 hours. CBG: No results for input(s): GLUCAP in the last 168 hours. Lipid Profile: No results for input(s): CHOL, HDL, LDLCALC, TRIG, CHOLHDL, LDLDIRECT in the last 72 hours. Thyroid Function Tests: No results for input(s): TSH, T4TOTAL, FREET4, T3FREE, THYROIDAB in the last 72 hours. Anemia Panel: No results for input(s): VITAMINB12, FOLATE, FERRITIN, TIBC, IRON, RETICCTPCT in the last 72 hours. Sepsis Labs: Recent Labs  Lab 01/21/21 2303  LATICACIDVEN 1.0    Recent Results (from the past 240 hour(s))  Blood culture (routine x 2)     Status: None (Preliminary result)   Collection Time: 01/21/21  7:59 PM   Specimen: BLOOD  Result Value Ref Range Status   Specimen Description BLOOD LEFT ARM  Final   Special Requests   Final    BOTTLES DRAWN AEROBIC AND ANAEROBIC Blood Culture adequate volume   Culture   Final    NO GROWTH < 12 HOURS Performed at [facility], [place] [address], [place], [place] [address]    Report Status PENDING  Incomplete  Resp Panel by RT-PCR (Flu A&B, Covid) Nasopharyngeal Swab     Status: None   Collection Time: 01/21/21  8:55 PM   Specimen: Nasopharyngeal Swab; Nasopharyngeal(NP) swabs in vial transport medium  Result Value Ref Range Status   SARS Coronavirus 2 by RT PCR NEGATIVE NEGATIVE Final    Comment: (NOTE) SARS-CoV-2 target nucleic acids are NOT DETECTED.  The SARS-CoV-2 RNA is generally detectable in upper respiratory specimens during the acute phase of infection. The lowest concentration of SARS-CoV-2 viral copies this assay can detect is 138 copies/mL. A negative result does not preclude SARS-Cov-2 infection and should  not be used as the sole basis for treatment or other patient management decisions. A negative result may occur with  improper specimen collection/handling, submission of specimen other than nasopharyngeal swab, presence of viral mutation(s) within the areas targeted by this assay, and inadequate number of viral copies(<138 copies/mL). A negative result must be combined with clinical observations, patient history, and epidemiological information. The expected result is Negative.  Fact Sheet for Patients:  EntrepreneurPulse.com.au  Fact Sheet for Healthcare Providers:  IncredibleEmployment.be  This test is no t yet approved or cleared by the [place] FDA and  has been authorized for detection and/or diagnosis of SARS-CoV-2 by FDA under an Emergency Use Authorization (EUA). This EUA will remain  in effect (meaning this test can be used) for the duration of the COVID-19 declaration under Section 564(b)(1) of the Act, 21 U.S.C.section 360bbb-3(b)(1), unless the authorization is terminated  or revoked sooner.       Influenza A by PCR NEGATIVE NEGATIVE Final   Influenza B by PCR NEGATIVE NEGATIVE Final    Comment: (NOTE) The Xpert Xpress SARS-CoV-2/FLU/RSV plus assay  is intended as an aid in the diagnosis of influenza from Nasopharyngeal swab specimens and should not be used as a sole basis for treatment. Nasal washings and aspirates are unacceptable for Xpert Xpress SARS-CoV-2/FLU/RSV testing.  Fact Sheet for Patients: EntrepreneurPulse.com.au  Fact Sheet for Healthcare Providers: IncredibleEmployment.be  This test is not yet approved or cleared by the [place] FDA and has been authorized for detection and/or diagnosis of SARS-CoV-2 by FDA under an Emergency Use Authorization (EUA). This EUA will remain in effect (meaning this test can be used) for the duration of the COVID-19 declaration under  Section 564(b)(1) of the Act, 21 U.S.C. section 360bbb-3(b)(1), unless the authorization is terminated or revoked.  Performed at [facility], [place] [address] [place], [place] [address]          Radiology Studies: DG Chest 2 View  Result Date: 01/21/2021 CLINICAL DATA:  Chest pain, difficulty breathing EXAM: CHEST - 2 VIEW COMPARISON:  12/19/2020 chest x-ray and chest CT. FINDINGS: Severe emphysema. Areas of scarring in the upper lobes. Nodular density in the left upper lung, shown on prior CT to be in the superior segment of the left lower lobe, likely unchanged. Continued consolidation in the right lower lobe with small right pleural effusion. Heart is normal size. No acute bony abnormality. IMPRESSION: Severe COPD. Continued consolidation in the right lower lobe with right effusion, concerning for pneumonia. Bilateral upper lobe scarring. Nodular density in the left upper lung as seen on prior CT and likely stable. Recommend continued follow-up as recommended on prior chest CT. Electronically Signed   By: Rolm Baptise M.D.   On: 01/21/2021 17:03   CT Angio Chest PE W and/or Wo Contrast  Result Date: 01/21/2021 CLINICAL DATA:  Left chest tightness EXAM: CT ANGIOGRAPHY CHEST WITH CONTRAST TECHNIQUE: Multidetector CT imaging of the chest was performed using the standard protocol during bolus administration of intravenous contrast. Multiplanar CT image reconstructions and MIPs were obtained to evaluate the vascular anatomy. CONTRAST:  51mL OMNIPAQUE IOHEXOL 350 MG/ML SOLN COMPARISON:  12/19/2020 FINDINGS: Cardiovascular: No filling defects in the pulmonary arteries to suggest pulmonary emboli. Heart is normal size. Aorta is normal caliber. Scattered aortic calcifications. Mediastinum/Nodes: No mediastinal, hilar, or axillary adenopathy. Trachea and esophagus are unremarkable. Thyroid unremarkable. Lungs/Pleura: Severe emphysema. Biapical scarring. 12 mm nodule in the superior segment of  the left lower lobe is stable. Small right pleural effusion again noted with consolidation in the right lower lobe, stable since prior study and concerning for pneumonia. The UB Upper Abdomen: Imaging into the upper abdomen demonstrates no acute findings. Musculoskeletal: Chest wall soft tissues are unremarkable. No acute bony abnormality. Review of the MIP images confirms the above findings. IMPRESSION: No evidence of pulmonary embolus. Severe emphysema and scarring, stable. Stable 12 mm nodule in the superior segment of the left lower lobe. This could be further evaluated with nuclear medicine PET CT. Continued consolidation in the right lower lobe with small right effusion. Findings concerning for pneumonia. Aortic Atherosclerosis (ICD10-I70.0) and Emphysema (ICD10-J43.9). Electronically Signed   By: Rolm Baptise M.D.   On: 01/21/2021 20:45        Scheduled Meds:  busPIRone  5 mg Oral TID   citalopram  20 mg Oral Daily   enoxaparin (LOVENOX) injection  40 mg Subcutaneous Q24H   gabapentin  300 mg Oral TID   ipratropium-albuterol  3 mL Nebulization QID   lactose free nutrition  237 mL Oral TID BM   mometasone-formoterol  2 puff  Inhalation BID   nicotine  14 mg Transdermal Daily   potassium chloride  40 mEq Oral Once   Continuous Infusions:  ceFEPime (MAXIPIME) IV Stopped (01/22/21 5300)   vancomycin 500 mg (01/22/21 0835)     LOS: 0 days    Time spent: 35 minutes.     Elmarie Shiley, MD [facility]   If 7PM-7AM, please contact night-coverage [email or web address]  01/22/2021, 8:44 AM

## 2021-01-22 NOTE — ED Notes (Signed)
I went in to talk to patient to explain to him that he is waiting for a bed upstairs. He said that why nobody came to tell him that. I told him I am not sure, but that I was letting him know now, he stated ok. I offered patient a [place] sandwich bag and ginger ale, he said ok. Patient is eating his snack at this time.

## 2021-01-22 NOTE — ED Notes (Signed)
Patient c/o being depressed denies SI/HI

## 2021-01-22 NOTE — Evaluation (Signed)
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 male admitted with SOB, 2 view chest x-ray showed severe emphysematous changes with persistent consolidation in the right lower lobe with right effusion.  Patient has been having physical deterioration since he fell off a ladder on June 30 resulting in multiple left-sided rib fractures.  This was complicated by pneumothorax requiring hospitalization and temporary chest tube placement which was since removed.  He was subsequently readmitted at [facility] 10/26/2020-11/01/2020 with pneumonia and was found to have MRSA bacteremia.  He was discharged to SNF but then admitted to [facility] 11/05/2020-11/08/2020 with acute on chronic hypoxemic respiratory failure due to MRSA pneumonia.  He completed full course of antibiotics with vancomycin and discharged back to SNF.    Assessment / Plan / Recommendation  Clinical Impression  Pt demonstrates no signs of dysphagia, denies any history of difficulty swallowing, reports he eats well despite weight loss. Reports he had an MBS in the last year that did not reveal any dysphagia to his knowledge (could not locate a record but pt  described test well). Recommend pt continue current diet without SLP f/u. Will sign off. SLP Visit Diagnosis: Dysphagia, unspecified (R13.10)    Aspiration Risk  Risk for inadequate nutrition/hydration    Diet Recommendation Regular;Thin liquid   Liquid Administration via: Cup;Straw Medication Administration: Whole meds with liquid Supervision: Patient able to self feed    Other  Recommendations      Recommendations for follow up therapy are one component of a multi-disciplinary discharge planning process, led by the attending physician.  Recommendations may be updated based on patient status, additional functional criteria and insurance authorization.  Follow up Recommendations        Frequency and Duration            Prognosis        Swallow Study   General HPI: [name] is a 70 y.o. male admitted with SOB, 2 view chest x-ray showed severe emphysematous changes with persistent consolidation in the right lower lobe with right effusion.  Patient has been having physical deterioration since he fell off a ladder on June 30 resulting in multiple left-sided rib fractures.  This was complicated by pneumothorax requiring hospitalization and temporary chest tube placement which was since removed.  He was subsequently readmitted at [facility] 10/26/2020-11/01/2020 with pneumonia and was found to have MRSA bacteremia.  He was discharged to SNF but then admitted to [facility] 11/05/2020-11/08/2020 with acute on chronic hypoxemic respiratory failure due to MRSA pneumonia.  He completed full course of antibiotics with vancomycin and discharged back to SNF. Type of Study:  Bedside Swallow Evaluation Previous Swallow Assessment: Pt reports MBS at another hospital, no significant finding. COuld not locate a record in care everywhere. Diet Prior to this Study: Regular;Thin liquids Temperature Spikes Noted: No Respiratory Status: Room air History of Recent Intubation:  No Behavior/Cognition: Alert;Cooperative;Pleasant mood Oral Cavity Assessment: Within Functional Limits Oral Care Completed by SLP: No Oral Cavity - Dentition: Poor condition;Missing dentition Vision: Functional for self-feeding Self-Feeding Abilities: Able to feed self Patient Positioning: Upright in bed Baseline Vocal Quality: Normal Volitional Cough: Strong Volitional Swallow: Able to elicit    Oral/Motor/Sensory Function Overall Oral Motor/Sensory Function: Within functional limits   Ice Chips     Thin Liquid Thin Liquid: Within functional limits    Nectar Thick Nectar Thick Liquid: Not tested   Honey Thick Honey Thick Liquid: Not tested   Puree Puree: Within functional limits   Solid     Solid: Within functional limits      [name], Katherene Ponto 01/22/2021,11:18 AM

## 2021-01-23 DIAGNOSIS — S2242XD Multiple fractures of ribs, left side, subsequent encounter for fracture with routine healing: Secondary | ICD-10-CM | POA: Diagnosis not present

## 2021-01-23 DIAGNOSIS — Z881 Allergy status to other antibiotic agents status: Secondary | ICD-10-CM | POA: Diagnosis not present

## 2021-01-23 DIAGNOSIS — Z681 Body mass index (BMI) 19 or less, adult: Secondary | ICD-10-CM | POA: Diagnosis not present

## 2021-01-23 DIAGNOSIS — F329 Major depressive disorder, single episode, unspecified: Secondary | ICD-10-CM | POA: Diagnosis present

## 2021-01-23 DIAGNOSIS — Z20822 Contact with and (suspected) exposure to covid-19: Secondary | ICD-10-CM | POA: Diagnosis present

## 2021-01-23 DIAGNOSIS — F418 Other specified anxiety disorders: Secondary | ICD-10-CM

## 2021-01-23 DIAGNOSIS — G894 Chronic pain syndrome: Secondary | ICD-10-CM | POA: Diagnosis present

## 2021-01-23 DIAGNOSIS — F419 Anxiety disorder, unspecified: Secondary | ICD-10-CM | POA: Diagnosis present

## 2021-01-23 DIAGNOSIS — Z888 Allergy status to other drugs, medicaments and biological substances status: Secondary | ICD-10-CM | POA: Diagnosis not present

## 2021-01-23 DIAGNOSIS — Z79899 Other long term (current) drug therapy: Secondary | ICD-10-CM | POA: Diagnosis not present

## 2021-01-23 DIAGNOSIS — W11XXXD Fall on and from ladder, subsequent encounter: Secondary | ICD-10-CM | POA: Diagnosis present

## 2021-01-23 DIAGNOSIS — J440 Chronic obstructive pulmonary disease with acute lower respiratory infection: Secondary | ICD-10-CM | POA: Diagnosis present

## 2021-01-23 DIAGNOSIS — R911 Solitary pulmonary nodule: Secondary | ICD-10-CM | POA: Diagnosis present

## 2021-01-23 DIAGNOSIS — Z8701 Personal history of pneumonia (recurrent): Secondary | ICD-10-CM | POA: Diagnosis not present

## 2021-01-23 DIAGNOSIS — E039 Hypothyroidism, unspecified: Secondary | ICD-10-CM | POA: Diagnosis present

## 2021-01-23 DIAGNOSIS — D509 Iron deficiency anemia, unspecified: Secondary | ICD-10-CM | POA: Diagnosis present

## 2021-01-23 DIAGNOSIS — Z8614 Personal history of Methicillin resistant Staphylococcus aureus infection: Secondary | ICD-10-CM | POA: Diagnosis not present

## 2021-01-23 DIAGNOSIS — E43 Unspecified severe protein-calorie malnutrition: Secondary | ICD-10-CM | POA: Diagnosis present

## 2021-01-23 DIAGNOSIS — M1990 Unspecified osteoarthritis, unspecified site: Secondary | ICD-10-CM | POA: Diagnosis present

## 2021-01-23 DIAGNOSIS — J189 Pneumonia, unspecified organism: Secondary | ICD-10-CM | POA: Diagnosis present

## 2021-01-23 DIAGNOSIS — F17210 Nicotine dependence, cigarettes, uncomplicated: Secondary | ICD-10-CM | POA: Diagnosis present

## 2021-01-23 LAB — BASIC METABOLIC PANEL
Anion gap: 7 (ref 5–15)
BUN: 20 mg/dL (ref 8–23)
CO2: 23 mmol/L (ref 22–32)
Calcium: 8.5 mg/dL — ABNORMAL LOW (ref 8.9–10.3)
Chloride: 105 mmol/L (ref 98–111)
Creatinine, Ser: 0.65 mg/dL (ref 0.61–1.24)
GFR, Estimated: >60 mL/min (ref >60)
Glucose, Bld: 98 mg/dL (ref 70–99)
Potassium: 3.8 mmol/L (ref 3.5–5.1)
Sodium: 135 mmol/L (ref 135–145)

## 2021-01-23 LAB — CBC
HCT: 33.1 % — ABNORMAL LOW (ref 39.0–52.0)
Hemoglobin: 10.8 g/dL — ABNORMAL LOW (ref 13.0–17.0)
MCH: 30.6 pg (ref 26.0–34.0)
MCHC: 32.6 g/dL (ref 30.0–36.0)
MCV: 93.8 fL (ref 80.0–100.0)
Platelets: 296 10*3/uL (ref 150–400)
RBC: 3.53 MIL/uL — ABNORMAL LOW (ref 4.22–5.81)
RDW: 13.6 % (ref 11.5–15.5)
WBC: 6.1 10*3/uL (ref 4.0–10.5)
nRBC: 0 % (ref 0.0–0.2)

## 2021-01-23 LAB — EXPECTORATED SPUTUM ASSESSMENT W GRAM STAIN, RFLX TO RESP C

## 2021-01-23 MED ORDER — SODIUM CHLORIDE 3 % IN NEBU
4.0000 mL | INHALATION_SOLUTION | Freq: Two times a day (BID) | RESPIRATORY_TRACT | Status: AC
  Administered 2021-01-23 – 2021-01-24 (×3): 4 mL via RESPIRATORY_TRACT
  Filled 2021-01-23 (×4): qty 4

## 2021-01-23 MED ORDER — ADULT MULTIVITAMIN W/MINERALS CH
1.0000 | ORAL_TABLET | Freq: Every day | ORAL | Status: DC
  Administered 2021-01-23 – 2021-01-26 (×4): 1 via ORAL
  Filled 2021-01-23 (×4): qty 1

## 2021-01-23 MED ORDER — LACTATED RINGERS IV SOLN: INTRAVENOUS | Status: AC

## 2021-01-23 MED ORDER — IPRATROPIUM-ALBUTEROL 0.5-2.5 (3) MG/3ML IN SOLN
3.0000 mL | Freq: Two times a day (BID) | RESPIRATORY_TRACT | Status: DC
  Administered 2021-01-23 – 2021-01-26 (×6): 3 mL via RESPIRATORY_TRACT
  Filled 2021-01-23 (×6): qty 3

## 2021-01-23 MED ORDER — GUAIFENESIN ER 600 MG PO TB12
1200.0000 mg | ORAL_TABLET | Freq: Two times a day (BID) | ORAL | Status: AC
  Filled 2021-01-23 (×4): qty 2

## 2021-01-23 NOTE — Progress Notes (Signed)
BP 88/49. MD made aware. See new orders.

## 2021-01-23 NOTE — Progress Notes (Signed)
PROGRESS NOTE    [name]  WUJ:[id number] DOB: 1950/10/03 DOA: 01/21/2021 PCP: Maryella Shivers, MD   Brief Narrative: 70 year old with past medical history significant for COPD, chronic anxiety/depression, previous prolonged hospital stay after trauma complicated by multiple rib fractures with pneumothorax requiring temporary chest tube, subsequent readmission for MRSA pneumonia bacteremia who presents to the ED for further evaluation of shortness of breath. CTA negative for PE, continued consolidation in the right lower lobe with a small effusion.  Stable 12 mm nodule in the superior segment of the left lower lobe.  Admitted for right lower lobe consolidation/pneumonia with effusion in the setting of recent MRSA Pneumonia bacteremia.  01/23/2021: Patient seen and examined at his bedside he reports chest congestion with a dry cough.  Endorses poor appetite.  He has a one-to-one sitter in the room.  Patient with history of depression not controlled.  Psychiatry consulted to assist with the management.   Assessment & Plan:   Principal Problem:   Right lower lobe pneumonia Active Problems:   COPD (chronic obstructive pulmonary disease) (HCC)   Depression with anxiety  Persistent right lower lobe consolidation/pneumonia with effusion recent history of MRSA pneumonia bacteremia Continue with IV vancomycin and cefepime for now Obtain sputum culture Blood cultures negative to date. Pet scan 9/22: (care everywhere) LEFT pulmonary nodule with uptake less than cardiac blood pool on  the current study. Findings suggest indolent process. Indolent  bronchogenic neoplasm remains a differential consideration. RIGHT lower lobe pneumonia. Material in RIGHT lower lobe bronchi  suggest aspiration, associated with small to moderate RIGHT-sided  pleural effusion.  He will need follow up with pulmonologist.   Severe protein calorie malnutrition BMI 13 Severe muscle mass loss. Dietitian  consulted Encourage increase in oral protein calorie intake.  Chronic anxiety/depression Depression not controlled Psychiatry consulted to assist with the management Currently on home BuSpar and Celexa, also on gabapentin 300 mg 3 times daily Management by psychiatry  COPD without oxygen treatment.:  Continue Dulera and DuoNeb  Left lower lobe pulmonary nodule:  Asked to follow-up with pulmonologist outpatient  Tobacco use disorder: Tobacco cessation counseling provided.  Estimated body mass index is 13.84 kg/m as calculated from the following:   Height as of this encounter: 6\' 1"  (1.854 m).   Weight as of this encounter: 47.6 kg.   DVT prophylaxis: Lovenox subcu daily. Code Status: Full code Family Communication: Care discussed with patient Disposition Plan:  Status is: Observation  The patient remains OBS appropriate and will d/c before 2 midnights.       Consultants:  Psychiatry  Procedures:  ECHO  Antimicrobials:  IV vancomycin, 01/22/2021. Cefepime, 01/22/2021.   Objective: Vitals:   01/23/21 0822 01/23/21 0835 01/23/21 1154 01/23/21 1243  BP: (!) 94/34   (!) 104/58  Pulse: 85   72  Resp: 12   20  Temp: 98.2 F (36.8 C)   (!) 97.5 F (36.4 C)  TempSrc: Oral     SpO2:  96% 96% 100%  Weight:      Height:        Intake/Output Summary (Last 24 hours) at 01/23/2021 1248 Last data filed at 01/23/2021 1210 Gross per 24 hour  Intake 798 ml  Output 1075 ml  Net -277 ml   Filed Weights   01/22/21 0900  Weight: 47.6 kg    Examination:  General exam: Frail-appearing in no acute distress.  He is alert and oriented x3. Respiratory system: Right rales noted at bases.  No wheezing noted.  Good inspiratory effort.   Cardiovascular system: Regular rate and rhythm no rubs or gallops.  No JVD or thyromegaly noted.   Gastrointestinal system: Soft nontender no bowel sounds present.  Central nervous system: Alert and oriented x3.  No focal neurological  deficits. Skin: No rashes noted. Psych: Flat affect.  Data Reviewed: I have personally reviewed following labs and imaging studies  CBC: Recent Labs  Lab 01/21/21 1632 01/22/21 0604 01/23/21 0303  WBC 9.0 5.4 6.1  NEUTROABS 5.6  --   --   HGB 13.8 11.6* 10.8*  HCT 42.1 35.1* 33.1*  MCV 92.5 93.1 93.8  PLT 426* 219 539   Basic Metabolic Panel: Recent Labs  Lab 01/21/21 1632 01/22/21 0604 01/23/21 0303  NA 138 134* 135  K 3.9 3.0* 3.8  CL 101 103 105  CO2 27 22 23   GLUCOSE 93 109* 98  BUN 22 18 20   CREATININE 0.72 0.59* 0.65  CALCIUM 9.6 8.3* 8.5*  MG  --  1.9  --   PHOS  --  3.5  --    GFR: Estimated Creatinine Clearance: 58.7 mL/min (by C-G formula based on SCr of 0.65 mg/dL). Liver Function Tests: Recent Labs  Lab 01/21/21 1632  AST 26  ALT 21  ALKPHOS 86  BILITOT 0.6  PROT 7.6  ALBUMIN 3.4*   No results for input(s): LIPASE, AMYLASE in the last 168 hours. No results for input(s): AMMONIA in the last 168 hours. Coagulation Profile: No results for input(s): INR, PROTIME in the last 168 hours. Cardiac Enzymes: No results for input(s): CKTOTAL, CKMB, CKMBINDEX, TROPONINI in the last 168 hours. BNP (last 3 results) No results for input(s): PROBNP in the last 8760 hours. HbA1C: No results for input(s): HGBA1C in the last 72 hours. CBG: No results for input(s): GLUCAP in the last 168 hours. Lipid Profile: No results for input(s): CHOL, HDL, LDLCALC, TRIG, CHOLHDL, LDLDIRECT in the last 72 hours. Thyroid Function Tests: No results for input(s): TSH, T4TOTAL, FREET4, T3FREE, THYROIDAB in the last 72 hours. Anemia Panel: No results for input(s): VITAMINB12, FOLATE, FERRITIN, TIBC, IRON, RETICCTPCT in the last 72 hours. Sepsis Labs: Recent Labs  Lab 01/21/21 2303 01/22/21 0604  PROCALCITON  --  <0.10  LATICACIDVEN 1.0  --     Recent Results (from the past 240 hour(s))  Blood culture (routine x 2)     Status: None (Preliminary result)   Collection  Time: 01/21/21  7:59 PM   Specimen: BLOOD  Result Value Ref Range Status   Specimen Description BLOOD LEFT ARM  Final   Special Requests   Final    BOTTLES DRAWN AEROBIC AND ANAEROBIC Blood Culture adequate volume   Culture   Final    NO GROWTH 2 DAYS Performed at [facility], [address], [place], [place] [address]    Report Status PENDING  Incomplete  Resp Panel by RT-PCR (Flu A&B, Covid) Nasopharyngeal Swab     Status: None   Collection Time: 01/21/21  8:55 PM   Specimen: Nasopharyngeal Swab; Nasopharyngeal(NP) swabs in vial transport medium  Result Value Ref Range Status   SARS Coronavirus 2 by RT PCR NEGATIVE NEGATIVE Final    Comment: (NOTE) SARS-CoV-2 target nucleic acids are NOT DETECTED.  The SARS-CoV-2 RNA is generally detectable in upper respiratory specimens during the acute phase of infection. The lowest concentration of SARS-CoV-2 viral copies this assay can detect is 138 copies/mL. A negative result does not preclude SARS-Cov-2 infection and should not be used  as the sole basis for treatment or other patient management decisions. A negative result may occur with  improper specimen collection/handling, submission of specimen other than nasopharyngeal swab, presence of viral mutation(s) within the areas targeted by this assay, and inadequate number of viral copies(<138 copies/mL). A negative result must be combined with clinical observations, patient history, and epidemiological information. The expected result is Negative.  Fact Sheet for Patients:  EntrepreneurPulse.com.au  Fact Sheet for Healthcare Providers:  IncredibleEmployment.be  This test is no t yet approved or cleared by the [place] FDA and  has been authorized for detection and/or diagnosis of SARS-CoV-2 by FDA under an Emergency Use Authorization (EUA). This EUA will remain  in effect (meaning this test can be used) for the duration of the COVID-19  declaration under Section 564(b)(1) of the Act, 21 U.S.C.section 360bbb-3(b)(1), unless the authorization is terminated  or revoked sooner.       Influenza A by PCR NEGATIVE NEGATIVE Final   Influenza B by PCR NEGATIVE NEGATIVE Final    Comment: (NOTE) The Xpert Xpress SARS-CoV-2/FLU/RSV plus assay is intended as an aid in the diagnosis of influenza from Nasopharyngeal swab specimens and should not be used as a sole basis for treatment. Nasal washings and aspirates are unacceptable for Xpert Xpress SARS-CoV-2/FLU/RSV testing.  Fact Sheet for Patients: EntrepreneurPulse.com.au  Fact Sheet for Healthcare Providers: IncredibleEmployment.be  This test is not yet approved or cleared by the [place] FDA and has been authorized for detection and/or diagnosis of SARS-CoV-2 by FDA under an Emergency Use Authorization (EUA). This EUA will remain in effect (meaning this test can be used) for the duration of the COVID-19 declaration under Section 564(b)(1) of the Act, 21 U.S.C. section 360bbb-3(b)(1), unless the authorization is terminated or revoked.  Performed at [facility],  [address], [place], [place] [address]   Blood culture (routine x 2)     Status: None (Preliminary result)   Collection Time: 01/21/21 11:03 PM   Specimen: BLOOD  Result Value Ref Range Status   Specimen Description BLOOD RIGHT ANTECUBITAL  Final   Special Requests   Final    BOTTLES DRAWN AEROBIC AND ANAEROBIC Blood Culture results may not be optimal due to an excessive volume of blood received in culture bottles   Culture   Final    NO GROWTH 1 DAY Performed at [facility], [place] [address] [place], [place] [address]    Report Status PENDING  Incomplete  MRSA Next Gen by PCR, Nasal     Status: Abnormal   Collection Time: 01/22/21  9:55 AM   Specimen: Nasal Mucosa; Nasal Swab  Result Value Ref Range Status   MRSA by PCR Next Gen DETECTED (A) NOT  DETECTED Final    Comment: RESULT CALLED TO, READ BACK BY AND VERIFIED WITH: RN J BONEY [id number] AT 1131 BY CM (NOTE) The GeneXpert MRSA Assay (FDA approved for NASAL specimens only), is one component of a comprehensive MRSA colonization surveillance program. It is not intended to diagnose MRSA infection nor to guide or monitor treatment for MRSA infections. Test performance is not FDA approved in patients less than 2 years old. Performed at [facility], [place] [address], [place], [place] [address]          Radiology Studies: DG Chest 2 View  Result Date: 01/21/2021 CLINICAL DATA:  Chest pain, difficulty breathing EXAM: CHEST - 2 VIEW COMPARISON:  12/19/2020 chest x-ray and chest CT. FINDINGS: Severe emphysema. Areas  of scarring in the upper lobes. Nodular density in the left upper lung, shown on prior CT to be in the superior segment of the left lower lobe, likely unchanged. Continued consolidation in the right lower lobe with small right pleural effusion. Heart is normal size. No acute bony abnormality. IMPRESSION: Severe COPD. Continued consolidation in the right lower lobe with right effusion, concerning for pneumonia. Bilateral upper lobe scarring. Nodular density in the left upper lung as seen on prior CT and likely stable. Recommend continued follow-up as recommended on prior chest CT. Electronically Signed   By: Rolm Baptise M.D.   On: 01/21/2021 17:03   CT Angio Chest PE W and/or Wo Contrast  Result Date: 01/21/2021 CLINICAL DATA:  Left chest tightness EXAM: CT ANGIOGRAPHY CHEST WITH CONTRAST TECHNIQUE: Multidetector CT imaging of the chest was performed using the standard protocol during bolus administration of intravenous contrast. Multiplanar CT image reconstructions and MIPs were obtained to evaluate the vascular anatomy. CONTRAST:  28mL OMNIPAQUE IOHEXOL 350 MG/ML SOLN COMPARISON:  12/19/2020 FINDINGS: Cardiovascular: No filling defects in the pulmonary arteries to  suggest pulmonary emboli. Heart is normal size. Aorta is normal caliber. Scattered aortic calcifications. Mediastinum/Nodes: No mediastinal, hilar, or axillary adenopathy. Trachea and esophagus are unremarkable. Thyroid unremarkable. Lungs/Pleura: Severe emphysema. Biapical scarring. 12 mm nodule in the superior segment of the left lower lobe is stable. Small right pleural effusion again noted with consolidation in the right lower lobe, stable since prior study and concerning for pneumonia. The UB Upper Abdomen: Imaging into the upper abdomen demonstrates no acute findings. Musculoskeletal: Chest wall soft tissues are unremarkable. No acute bony abnormality. Review of the MIP images confirms the above findings. IMPRESSION: No evidence of pulmonary embolus. Severe emphysema and scarring, stable. Stable 12 mm nodule in the superior segment of the left lower lobe. This could be further evaluated with nuclear medicine PET CT. Continued consolidation in the right lower lobe with small right effusion. Findings concerning for pneumonia. Aortic Atherosclerosis (ICD10-I70.0) and Emphysema (ICD10-J43.9). Electronically Signed   By: Rolm Baptise M.D.   On: 01/21/2021 20:45        Scheduled Meds:  busPIRone  5 mg Oral TID   citalopram  20 mg Oral Daily   enoxaparin (LOVENOX) injection  40 mg Subcutaneous Q24H   gabapentin  300 mg Oral TID   guaiFENesin  600 mg Oral BID   ipratropium-albuterol  3 mL Nebulization QID   lactose free nutrition  237 mL Oral TID BM   mometasone-formoterol  2 puff Inhalation BID   nicotine  14 mg Transdermal Daily   Continuous Infusions:  ceFEPime (MAXIPIME) IV 2 g (01/23/21 7371)   lactated ringers 50 mL/hr at 01/23/21 0834   vancomycin 500 mg (01/23/21 0837)     LOS: 0 days    Time spent: 35 minutes.     Kayleen Memos, MD [facility]   If 7PM-7AM, please contact night-coverage [email or web address]  01/23/2021, 12:48 PM

## 2021-01-23 NOTE — Consult Note (Signed)
[facility] New Psychiatric Evaluation   Service Date: January 23, 2021 LOS:  LOS: 0 days    Assessment  [name] is a 70 y.o. male admitted medically for 01/21/2021  4:14 PM for pneumonia. He carries the psychiatric diagnoses of MDD, GAD and has a past medical history most significant for COPD.Psychiatry was consulted for depression by Irene Pap, MD.    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 Interview is complicated by pt's overall negative view on psychiatry (he does not consider citalopram a psych med) and feelings of stigmatization from being placed on suicide precautions. Current outpatient psychotropic medications include citalopram and buspirone and historically he has had a good response to citalopram and a poor response to buspirone. He was compliant compliant with medications prior to admission. On initial examination, patient denies any current or prior suicidality and would be interested in raising his dose of citalopram to previously effective 40 mg dose when more medically stable. He is currently not interested in working with therapy intern.  Please see plan below for detailed recommendations.   Diagnoses:  Active Hospital problems: Principal Problem:   Right lower lobe pneumonia Active Problems:   COPD (chronic obstructive pulmonary disease) ([place])   Depression with anxiety    Problems edited/added by me: No problems updated.  Plan  ## Safety and Observation Level:  - Based on my clinical evaluation, I estimate the patient to be at low risk of self harm in the current setting - At this time, we recommend a routine level of observation. This decision is based on my review of the chart including patient's history and current presentation, interview of the patient, mental status examination, and consideration of suicide risk including evaluating suicidal  ideation, plan, intent, suicidal or self-harm behaviors, risk factors, and protective factors. This judgment is based on our ability to directly address suicide risk, implement suicide prevention strategies and develop a safety plan while the patient is in the clinical setting. Please contact our team if there is a concern that risk level has changed.   ## Medications:  -- c citalopram 20 mg -- dc buspirone 5 mg TID  Patient has overall (-) view of psychiatry and denies psychiatric symptoms when framed as such. It may be worth involving palliative care over psychiatry for some complaints (low appetite, etc) given above.   ## Medical Decision Making Capacity:  Not formally assessed  ## Further Work-up:  -- B12 wnl,  -- please obtain TSH   ## Disposition:  -- per medical team  Thank you for this consult request. Recommendations have been communicated to the primary team.  We will continue to follow at this time.   [place] A Aulton Routt    NEW  history  Relevant Aspects of Hospital Course:  Admitted on 01/21/2021 for shortness of breath. Has generally been declining since a fall from a ladder on June 30 resulting in multiple left-sided rib fractures.  This was complicated by pneumothorax requiring hospitalization and temporary chest tube placement which was since removed.  He was subsequently readmitted at [facility] 10/26/2020-11/01/2020 with pneumonia and was found to have MRSA bacteremia.  He was discharged to SNF but then admitted to [facility] 11/05/2020-11/08/2020 with acute on chronic hypoxemic respiratory failure due to MRSA pneumonia.  He completed full course of antibiotics with vancomycin and discharged back to SNF. Has been feeling more depressed, no SI/HI;  argued with daughter prior to coming to hospital.    Has a pulmonary nodule (stable x years, recent PET ct with no increased uptake). Has hx GI polyps (colonoscopy due in 2021). 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   Per chart review, multiple recent changes to psychotropic medications - on citalopram 20 mg (LF 9/23), ?venlafaxine 150 mg (LF 9/8), buproprion 150 mg (LF 9/27), buspirone 5 mg TID (LF 10/11), klonopin 1 mg BID (LF 9/23) - also filled trazodone back in July.   Review of notes through hospitalization indicate pt consistently denying SI.   Patient Report:  Patient reports worsening mood over past 1.5 months; his son is currently hospitalized after a motorcycle accident. He has also been suffering from pneumonia and feeling sicker himself. He shared this with the doctor who admitted him; he is upset that he was put on suicide watch (feels othered by maroon scrubs, lack of access to cell phone, lack of silverware, etc). He has an overall negative view of psychiatry and does not view any of his current medications (including celexa, buspirone, klonopin, etc) as psychotropics. He indicates that he was doing fairly well on celexa/klonopin a few months ago, and his new doctor stopped klonopin and tried to replace it with buspirone and buproprion; this coincides with decline in physical health and worsening mood. His outpt dr will not prescribe any controlled substance.   Adamantly denies any recent suicidal thoughts, plans or statements - closest he came was telling his daughter he planned to keep smoking because "something will kill me eventually". Has cut back from 2 packs to ~8 cigarettes/day, doesn't feel ready to cut back further. Recently cut back from a few beers a day to no alcohol for several months. Primary benefit from celexa is to anxiety - easier to keep with one train of thought.   Generally denies psychiatric symptoms when framed as such. To me he denied poor appetite since getting in the hospital.   He has a gun at home, which he keeps unloaded but not locked up. Discussed gun safety extensively.   Gives permission  to call daughter  ROS:  Significant (+) chest pain on inspiration Significant (-) blood in stool, change in quality of stool Rest of ROS is (-)  Collateral information:  Called daughter x2 no response  Psychiatric History:  Information collected from pt  Family psych history: minimal See med list above Has never seen psychiatry or therapy, only family dr No history SI/SA Prior bad reaction to remeron (rash, outbreak) Prior bad reaction to buspirone (tried recently, discontinued today) - made him more anxious.   Medical History: Past Medical History:  Diagnosis Date  . Anxiety   . Arthritis   . Cataract   . COPD (chronic obstructive pulmonary disease) (H[facility])    inhaler  . Depression   . 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 BACK SURGERY    . LUMBAR DISC SURGERY Left 06/2003   L4 on L3 hemilaminectomy, foraminotomy for 3-4,4-5 with left L3-L4 extraforaminal diskectomy/notes 08/22/2010   . REPAIR DURAL / CSF LEAK  07/2003   [facility] Endo 08/22/2010    Medications:   Current Facility-Administered Medications:  .  acetaminophen (TYLENOL) tablet 1,000 mg, 1,000 mg, Oral, Q6H PRN, 1,000 mg at 01/23/21 1235 **OR** acetaminophen (TYLENOL) suppository 650 mg, 650 mg, Rectal, Q6H PRN, Posey Pronto,  Vishal R, MD .  albuterol (PROVENTIL) (2.5 MG/3ML) 0.083% nebulizer solution 2.5 mg, 2.5 mg, Inhalation, Q6H PRN, Posey Pronto, Vishal R, MD .  busPIRone (BUSPAR) tablet 5 mg, 5 mg, Oral, TID, Patel, Vishal R, MD .  ceFEPIme (MAXIPIME) 2 g in sodium chloride 0.9 % 100 mL IVPB, 2 g, Intravenous, Q8H, Bertis Ruddy, RPH, Last Rate: 200 mL/hr at 01/23/21 0652, 2 g at 01/23/21 0652 .  citalopram (CELEXA) tablet 20 mg, 20 mg, Oral, Daily, Zada Finders R, MD, 20 mg at 01/23/21 0823 .  enoxaparin (LOVENOX) injection 40 mg, 40 mg, Subcutaneous, Q24H, Zada Finders R, MD, 40 mg at 01/22/21 2259 .  gabapentin (NEURONTIN) capsule 300  mg, 300 mg, Oral, TID, Zada Finders R, MD, 300 mg at 01/23/21 0823 .  guaiFENesin (MUCINEX) 12 hr tablet 600 mg, 600 mg, Oral, BID, Regalado, Belkys A, MD, 600 mg at 01/23/21 5784 .  ipratropium-albuterol (DUONEB) 0.5-2.5 (3) MG/3ML nebulizer solution 3 mL, 3 mL, Nebulization, QID, Zada Finders R, MD, 3 mL at 01/23/21 1154 .  lactated ringers infusion, , Intravenous, Continuous, Kayleen Memos, DO, Last Rate: 50 mL/hr at 01/23/21 0834, New Bag at 01/23/21 0834 .  lactose free nutrition (Boost) liquid 237 mL, 237 mL, Oral, TID BM, Zada Finders R, MD, 237 mL at 01/22/21 2047 .  mometasone-formoterol (DULERA) 200-5 MCG/ACT inhaler 2 puff, 2 puff, Inhalation, BID, Zada Finders R, MD, 2 puff at 01/22/21 0812 .  nicotine (NICODERM CQ - dosed in mg/24 hours) patch 14 mg, 14 mg, Transdermal, Daily, Zada Finders R, MD, 14 mg at 01/23/21 0826 .  ondansetron (ZOFRAN) tablet 4 mg, 4 mg, Oral, Q6H PRN **OR** ondansetron (ZOFRAN) injection 4 mg, 4 mg, Intravenous, Q6H PRN, Zada Finders R, MD .  traMADol (ULTRAM) tablet 100 mg, 100 mg, Oral, TID PRN, Lenore Cordia, MD, 100 mg at 01/23/21 0823 .  traZODone (DESYREL) tablet 25-50 mg, 25-50 mg, Oral, QHS PRN, Lenore Cordia, MD, 50 mg at 01/22/21 2303 .  vancomycin (VANCOREADY) IVPB 500 mg/100 mL, 500 mg, Intravenous, Q12H, Bertis Ruddy, RPH, Last Rate: 100 mL/hr at 01/23/21 0837, 500 mg at 01/23/21 6962  Allergies: Allergies  Allergen Reactions  . Erythromycin Base Other (See Comments)    hallucinations  . Mirtazapine Other (See Comments)  . Nsaids Other (See Comments)    Social History:  Lives alone Daughter and son nearby  Tobacco use: 8-10 cigarettes/d Alcohol use: sober x several months Drug use: denies x 50 years  Family History:  The patient's family history includes Colon cancer in his paternal grandfather; Colon polyps in his father; Diabetes in his brother, father, paternal uncle, and son; Heart disease in his father; Heart failure  in his father; Muscular dystrophy in his mother; Stroke in his father.    Objective  Vital signs:  Temp:  [97.5 F (36.4 C)-98.6 F (37 C)] 97.5 F (36.4 C) (10/18 1243) Pulse Rate:  [72-89] 72 (10/18 1243) Resp:  [12-21] 20 (10/18 1243) BP: (93-118)/(34-80) 104/58 (10/18 1243) SpO2:  [94 %-100 %] 100 % (10/18 1243)  Physical Exam: Gen: thin, in maroon scrubs,  Head: normacephalic/atraumatic Pulm: on RA,  no increased WOB while lying in bed Psych: A&Ox4  Mental Status Exam: Appearance: Thin, well groomed  Attitude:  Upset, cooperative  Behavior/Psychomotor: No increased rate of gesturing, no psychomotor slowing noted  Speech/Language:  Nl rate, amount, spontaneity, etc  Mood: "Down lately"  Affect: Congruent, appropriate to conversation  Thought process: Clear, coherent, goal directed  Thought content:   Devoid of SI/HI/delusions/paranoia  Perceptual disturbances:  Not endorsed, not RIS  Attention: Good   Concentration: Good (DOWB and mOYB no errors)  Orientation: full  Memory: Recent and remote intact  Fund of knowledge:  Fair (missed Obama/Clinton on recent presidents)  Insight:   Fair, clouded by negative view of psychiatry   Judgment:  good  Impulse Control: good

## 2021-01-23 NOTE — Care Management Obs Status (Signed)
[facility] NOTIFICATION   Patient Details  Name: [name] MRN: [id number] Date of Birth: 1950-09-08   Medicare Observation Status Notification Given:  Yes    Angelita Ingles, RN 01/23/2021, 9:42 AM

## 2021-01-23 NOTE — Progress Notes (Signed)
Initial Nutrition Assessment  DOCUMENTATION CODES:  Severe malnutrition in context of chronic illness, Underweight  INTERVENTION:  Downgrade Dysphagia 3 diet.  Continue Boost Plus TID.  Obtain admission weight.  Add Magic cup TID with meals, each supplement provides 290 kcal and 9 grams of protein.  Add MVI with minerals daily.  NUTRITION DIAGNOSIS:  Severe Malnutrition related to chronic illness (recurrent PNA) as evidenced by severe fat depletion, severe muscle depletion.  GOAL:  Patient will meet greater than or equal to 90% of their needs  MONITOR:  PO intake, Supplement acceptance, Diet advancement, Labs, I & O's  REASON FOR ASSESSMENT:  Consult Assessment of nutrition requirement/status  ASSESSMENT:  70 yo male with a PMH of COPD, depression, previous prolonged hospital stay after trauma complicated by multiple rib fractures with pneumothorax requiring temporary chest tube, subsequent readmission for MRSA pneumonia bacteremia who is admitted with RLL PNA.  Spoke with pt at bedside. Pt reports that his PO intake and weight decreased since June 2022. He reports having PNA 3 times since then, causing his weight to decrease more. He reports that he lives off of microwave meals and sometimes goes out to eat with his son.  He reports a 30 lb weight loss since June 2022. Per Epic, pt ate 75% of a snack last night in ED ([place] sandwich and a ginger ale) and 95% of his breakfast this morning.  Per Epic, weight appears to be copied from previous admission in August 2022. RD to order new measured weight to determine weight changes.  Continue Boost Plus TID and add MVI with minerals, as well as Magic Cup TID.  RD to downgrade to Dysphagia 3 diet due to some difficulty chewing.  Supplements: Boost Plus TID  Medications: reviewed; LR @ 50 ml/hr  Labs: reviewed  NUTRITION - FOCUSED PHYSICAL EXAM: Flowsheet Row Most Recent Value  Orbital Region Severe depletion  Upper Arm  Region Severe depletion  Thoracic and Lumbar Region Severe depletion  Buccal Region Severe depletion  Temple Region Severe depletion  Clavicle Bone Region Severe depletion  Clavicle and Acromion Bone Region Severe depletion  Scapular Bone Region Severe depletion  Dorsal Hand Severe depletion  Patellar Region Severe depletion  Anterior Thigh Region Severe depletion  Posterior Calf Region Severe depletion  Edema (RD Assessment) None  Hair Reviewed  Eyes Reviewed  Mouth Reviewed  Skin Reviewed  Nails Reviewed   Diet Order:   Diet Order             DIET DYS 3 Room service appropriate? Yes; Fluid consistency: Thin  Diet effective now                  EDUCATION NEEDS:  Education needs have been addressed  Skin:  Skin Assessment: Reviewed RN Assessment  Last BM:  unknown  Height:  Ht Readings from Last 1 Encounters:  01/22/21 6\' 1"  (1.854 m)   Weight:  Wt Readings from Last 1 Encounters:  01/22/21 47.6 kg   BMI:  Body mass index is 13.84 kg/m.  Estimated Nutritional Needs:  Kcal:  1900-2100 Protein:  70-85 grams Fluid:  >1.9 L  Derrel Nip, RD, LDN (she/her/hers) Registered Dietitian I After-Hours/Weekend Pager # in [place]

## 2021-01-24 LAB — COMPREHENSIVE METABOLIC PANEL
ALT: 16 U/L (ref 0–44)
AST: 20 U/L (ref 15–41)
Albumin: 2.7 g/dL — ABNORMAL LOW (ref 3.5–5.0)
Alkaline Phosphatase: 70 U/L (ref 38–126)
Anion gap: 8 (ref 5–15)
BUN: 21 mg/dL (ref 8–23)
CO2: 26 mmol/L (ref 22–32)
Calcium: 8.7 mg/dL — ABNORMAL LOW (ref 8.9–10.3)
Chloride: 102 mmol/L (ref 98–111)
Creatinine, Ser: 0.61 mg/dL (ref 0.61–1.24)
GFR, Estimated: >60 mL/min (ref >60)
Glucose, Bld: 90 mg/dL (ref 70–99)
Potassium: 4.1 mmol/L (ref 3.5–5.1)
Sodium: 136 mmol/L (ref 135–145)
Total Bilirubin: 0.4 mg/dL (ref 0.3–1.2)
Total Protein: 5.9 g/dL — ABNORMAL LOW (ref 6.5–8.1)

## 2021-01-24 LAB — CBC
HCT: 32.9 % — ABNORMAL LOW (ref 39.0–52.0)
Hemoglobin: 10.6 g/dL — ABNORMAL LOW (ref 13.0–17.0)
MCH: 30.5 pg (ref 26.0–34.0)
MCHC: 32.2 g/dL (ref 30.0–36.0)
MCV: 94.5 fL (ref 80.0–100.0)
Platelets: 265 10*3/uL (ref 150–400)
RBC: 3.48 MIL/uL — ABNORMAL LOW (ref 4.22–5.81)
RDW: 13.6 % (ref 11.5–15.5)
WBC: 6.2 10*3/uL (ref 4.0–10.5)
nRBC: 0 % (ref 0.0–0.2)

## 2021-01-24 LAB — RETICULOCYTES
Immature Retic Fract: 6.6 % (ref 2.3–15.9)
RBC.: 3.48 MIL/uL — ABNORMAL LOW (ref 4.22–5.81)
Retic Count, Absolute: 32 10*3/uL (ref 19.0–186.0)
Retic Ct Pct: 0.9 % (ref 0.4–3.1)

## 2021-01-24 LAB — PHOSPHORUS: Phosphorus: 3.8 mg/dL (ref 2.5–4.6)

## 2021-01-24 LAB — VANCOMYCIN, TROUGH: Vancomycin Tr: 9 ug/mL — ABNORMAL LOW (ref 15–20)

## 2021-01-24 LAB — TSH: TSH: 4.433 u[IU]/mL (ref 0.350–4.500)

## 2021-01-24 LAB — MAGNESIUM: Magnesium: 1.9 mg/dL (ref 1.7–2.4)

## 2021-01-24 MED ORDER — POLYETHYLENE GLYCOL 3350 17 G PO PACK
17.0000 g | PACK | Freq: Every day | ORAL | Status: DC | PRN
  Administered 2021-01-24: 17 g via ORAL
  Filled 2021-01-24 (×2): qty 1

## 2021-01-24 MED ORDER — VANCOMYCIN HCL 500 MG/100ML IV SOLN
500.0000 mg | Freq: Two times a day (BID) | INTRAVENOUS | Status: DC
  Administered 2021-01-24: 500 mg via INTRAVENOUS
  Filled 2021-01-24: qty 100

## 2021-01-24 MED ORDER — SODIUM CHLORIDE 0.9 % IV SOLN
2.0000 g | Freq: Two times a day (BID) | INTRAVENOUS | Status: DC
  Administered 2021-01-24 – 2021-01-25 (×2): 2 g via INTRAVENOUS
  Filled 2021-01-24 (×2): qty 2

## 2021-01-24 MED ORDER — VANCOMYCIN VARIABLE DOSE PER UNSTABLE RENAL FUNCTION (PHARMACIST DOSING): Status: DC

## 2021-01-24 NOTE — Progress Notes (Signed)
Pharmacy Antibiotic Note  [name] is a 70 y.o. male admitted on 01/21/2021 presenting with CP, nausea, SOB.  Pharmacy was consulted on 10/16 for vancomycin and cefepime dosing for pneumonia.    Vancomycin trough is slightly low at 9 mcg/ml but could still represent therapeutic AUC - will reorder 500mg  q12h and check peak level.   Plan: Resume vancomycin 500mg  IV q12h for now Check peak level 1h AFTER dose is done infusing   Height: 6\' 1"  (185.4 cm) Weight: 43.2 kg (95 lb 3.8 oz) IBW/kg (Calculated) : 79.9 Dosing weight = 43.2 actual body weight  Temp (24hrs), Avg:98.4 F (36.9 C), Min:97.4 F (36.3 C), Max:99.2 F (37.3 C)  Recent Labs  Lab 01/21/21 1632 01/21/21 2303 01/22/21 0604 01/23/21 0303 01/24/21 0309 01/24/21 1849  WBC 9.0  --  5.4 6.1 6.2  --   CREATININE 0.72  --  0.59* 0.65 0.61  --   LATICACIDVEN  --  1.0  --   --   --   --   VANCOTROUGH  --   --   --   --   --  9*     Estimated Creatinine Clearance: 53.3 mL/min (by C-G formula based on SCr of 0.61 mg/dL).    Allergies  Allergen Reactions   Erythromycin Base Other (See Comments)    hallucinations   Mirtazapine Other (See Comments)   Nsaids Other (See Comments)    Arrie Senate, PharmD, BCPS, [facility] Clinical Pharmacist [phone] Please check AMION for all [place] numbers 01/24/2021

## 2021-01-24 NOTE — Progress Notes (Signed)
Pharmacy Antibiotic Note  [name] is a 70 y.o. male admitted on 01/21/2021 presenting with CP, nausea, SOB.  Pharmacy was consulted on 10/16 for vancomycin and cefepime dosing for pneumonia.    WBC remains wnl, afebrile SCr 0.61, stable <1 however pt is underweight at 43.2 kg, BMI only 12.5.   I will check vancomycin trough tonight to ensure current dose of 500mg  q12h is appropriate.  I will adjust cefepime dose for crcl<60 ml/min ,  currentl estimated CrCl is 53 ml/min.   Antibiotics this admission:   Vanc 10/16 > Cefepime 10/16 >  Microbiology: 10/17 MRSA PCR : positive 10/16 BCx  x2>ngtd  10/18 sputum:  GPC, GNRs,  pending   Plan: Reduce Cefepime to 2 g IV q 12h  (CrCl < 60 ml/hr).  Hold Vancomycin dose  tonight until vanc trough checked tonight, then we will recalculate dose as  necessary.  Monitor renal function, PCR/Cultures to narrow Vancomycin levels as needed per protocol   Height: 6\' 1"  (185.4 cm) Weight: 43.2 kg (95 lb 3.8 oz) IBW/kg (Calculated) : 79.9 Dosing weight = 43.2 actual body weight  Temp (24hrs), Avg:98.2 F (36.8 C), Min:97.4 F (36.3 C), Max:99.2 F (37.3 C)  Recent Labs  Lab 01/21/21 1632 01/21/21 2303 01/22/21 0604 01/23/21 0303 01/24/21 0309  WBC 9.0  --  5.4 6.1 6.2  CREATININE 0.72  --  0.59* 0.65 0.61  LATICACIDVEN  --  1.0  --   --   --      Estimated Creatinine Clearance: 53.3 mL/min (by C-G formula based on SCr of 0.61 mg/dL).    Allergies  Allergen Reactions   Erythromycin Base Other (See Comments)    hallucinations   Mirtazapine Other (See Comments)   Nsaids Other (See Comments)    Thank you for allowing pharmacy to be part of this patients care team. Nicole Cella, [place] Pharmacist [phone] 01/24/2021 2:43 PM Please check AMION for all [place] phone numbers After 10:00 PM, call [place]

## 2021-01-24 NOTE — Progress Notes (Signed)
PROGRESS NOTE    [name]  IEP:[id number] DOB: 02/19/1951 DOA: 01/21/2021 PCP: Maryella Shivers, MD   Brief Narrative: 70 year old with past medical history significant for COPD, chronic anxiety/depression, recent prolonged hospital stay after trauma complicated by multiple rib fractures with pneumothorax requiring temporary chest tube, subsequent readmission for MRSA pneumonia, bacteremia who presents to [facility] ED for further evaluation of shortness of breath. CTA negative for PE, continued consolidation in the right lower lobe with a small effusion.  Stable 12 mm nodule in the superior segment of the left lower lobe.  Admitted for right lower lobe consolidation/pneumonia with effusion in the setting of recent MRSA Pneumonia bacteremia.  01/24/2021: Seen at bedside.  Still feels congested.  Denies dysphagia.  Discussed with speech therapist, patient has passed his swallow evaluation.   Assessment & Plan:   Principal Problem:   Right lower lobe pneumonia Active Problems:   COPD (chronic obstructive pulmonary disease) (HCC)   Depression with anxiety   RLL pneumonia  Persistent right lower lobe consolidation/pneumonia with effusion recent history of MRSA pneumonia bacteremia Continue with IV vancomycin and cefepime for now Obtain sputum culture, reincubated for better growth. Blood cultures no growth to date. Pet scan 9/22: (care everywhere) LEFT pulmonary nodule with uptake less than cardiac blood pool on  the current study. Findings suggest indolent process. Indolent  bronchogenic neoplasm remains a differential consideration. RIGHT lower lobe pneumonia. Material in RIGHT lower lobe bronchi  suggest aspiration, associated with small to moderate RIGHT-sided  pleural effusion.  He will need follow up with pulmonologist.   Chronic normocytic anemia Hemoglobin 10.6 Obtain iron studies and closely monitor H&H  Severe protein calorie malnutrition BMI 13 Severe muscle mass  loss. Dietitian consulted, appreciate assistance. Continue to encourage increase in oral protein calorie intake.  Hypoalbuminemia in the setting of malnutrition Albumin level 2.7 Continue to encourage increase in oral intake.  Chronic anxiety/depression Depression not controlled.  No suicidal ideation. Seen by psychiatry Currently on home BuSpar and Celexa, also on gabapentin 300 mg 3 times daily  COPD without oxygen treatment.:  Continue Dulera and DuoNeb  Left lower lobe pulmonary nodule:  Asked to follow-up with pulmonologist outpatient  Tobacco use disorder: Tobacco cessation counseling provided.  History of colonic polyp/tubular adenoma Previously seen by GI    Estimated body mass index is 12.57 kg/m as calculated from the following:   Height as of this encounter: 6\' 1"  (1.854 m).   Weight as of this encounter: 43.2 kg.   DVT prophylaxis: Lovenox subcu daily. Code Status: Full code Family Communication: Care discussed with patient Disposition Plan:  Status is: Observation  The patient remains OBS appropriate and will d/c before 2 midnights.       Consultants:  Psychiatry  Procedures:  ECHO  Antimicrobials:  IV vancomycin, 01/22/2021. Cefepime, 01/22/2021.   Objective: Vitals:   01/24/21 0546 01/24/21 0800 01/24/21 0840 01/24/21 1148  BP:  117/68  99/60  Pulse:  76  75  Resp:  14  19  Temp:  99.2 F (37.3 C)  98 F (36.7 C)  TempSrc:  Oral  Oral  SpO2:  99% 100% 98%  Weight: 43.2 kg     Height:        Intake/Output Summary (Last 24 hours) at 01/24/2021 1512 Last data filed at 01/24/2021 0800 Gross per 24 hour  Intake 853.15 ml  Output 910 ml  Net -56.85 ml   Filed Weights   01/22/21 0900 01/24/21 0546  Weight: 47.6 kg 43.2 kg  Examination:  General exam: Frail-appearing no acute distress.  He is alert and oriented x3.   Respiratory system: Mild rales at bases no wheezing noted.  Poor inspiratory effort.   Cardiovascular  system: Regular rate and rhythm no rubs or gallops.   Gastrointestinal system: Soft nontender normal bowel sounds present.   Central nervous system: Alert and oriented x3.  Nonfocal exam.  Skin: No rashes or lesions noted. Psych: Mood is appropriate for condition 7. Data Reviewed: I have personally reviewed following labs and imaging studies  CBC: Recent Labs  Lab 01/21/21 1632 01/22/21 0604 01/23/21 0303 01/24/21 0309  WBC 9.0 5.4 6.1 6.2  NEUTROABS 5.6  --   --   --   HGB 13.8 11.6* 10.8* 10.6*  HCT 42.1 35.1* 33.1* 32.9*  MCV 92.5 93.1 93.8 94.5  PLT 426* 219 296 629   Basic Metabolic Panel: Recent Labs  Lab 01/21/21 1632 01/22/21 0604 01/23/21 0303 01/24/21 0309  NA 138 134* 135 136  K 3.9 3.0* 3.8 4.1  CL 101 103 105 102  CO2 27 22 23 26   GLUCOSE 93 109* 98 90  BUN 22 18 20 21   CREATININE 0.72 0.59* 0.65 0.61  CALCIUM 9.6 8.3* 8.5* 8.7*  MG  --  1.9  --  1.9  PHOS  --  3.5  --  3.8   GFR: Estimated Creatinine Clearance: 53.3 mL/min (by C-G formula based on SCr of 0.61 mg/dL). Liver Function Tests: Recent Labs  Lab 01/21/21 1632 01/24/21 0309  AST 26 20  ALT 21 16  ALKPHOS 86 70  BILITOT 0.6 0.4  PROT 7.6 5.9*  ALBUMIN 3.4* 2.7*   No results for input(s): LIPASE, AMYLASE in the last 168 hours. No results for input(s): AMMONIA in the last 168 hours. Coagulation Profile: No results for input(s): INR, PROTIME in the last 168 hours. Cardiac Enzymes: No results for input(s): CKTOTAL, CKMB, CKMBINDEX, TROPONINI in the last 168 hours. BNP (last 3 results) No results for input(s): PROBNP in the last 8760 hours. HbA1C: No results for input(s): HGBA1C in the last 72 hours. CBG: No results for input(s): GLUCAP in the last 168 hours. Lipid Profile: No results for input(s): CHOL, HDL, LDLCALC, TRIG, CHOLHDL, LDLDIRECT in the last 72 hours. Thyroid Function Tests: Recent Labs    01/24/21 0633  TSH 4.433   Anemia Panel: No results for input(s):  VITAMINB12, FOLATE, FERRITIN, TIBC, IRON, RETICCTPCT in the last 72 hours. Sepsis Labs: Recent Labs  Lab 01/21/21 2303 01/22/21 0604  PROCALCITON  --  <0.10  LATICACIDVEN 1.0  --     Recent Results (from the past 240 hour(s))  Blood culture (routine x 2)     Status: None (Preliminary result)   Collection Time: 01/21/21  7:59 PM   Specimen: BLOOD  Result Value Ref Range Status   Specimen Description BLOOD LEFT ARM  Final   Special Requests   Final    BOTTLES DRAWN AEROBIC AND ANAEROBIC Blood Culture adequate volume   Culture   Final    NO GROWTH 3 DAYS Performed at [facility], [address] [place], [place] [address]    Report Status PENDING  Incomplete  Resp Panel by RT-PCR (Flu A&B, Covid) Nasopharyngeal Swab     Status: None   Collection Time: 01/21/21  8:55 PM   Specimen: Nasopharyngeal Swab; Nasopharyngeal(NP) swabs in vial transport medium  Result Value Ref Range Status   SARS Coronavirus 2 by RT PCR NEGATIVE NEGATIVE Final  Comment: (NOTE) SARS-CoV-2 target nucleic acids are NOT DETECTED.  The SARS-CoV-2 RNA is generally detectable in upper respiratory specimens during the acute phase of infection. The lowest concentration of SARS-CoV-2 viral copies this assay can detect is 138 copies/mL. A negative result does not preclude SARS-Cov-2 infection and should not be used as the sole basis for treatment or other patient management decisions. A negative result may occur with  improper specimen collection/handling, submission of specimen other than nasopharyngeal swab, presence of viral mutation(s) within the areas targeted by this assay, and inadequate number of viral copies(<138 copies/mL). A negative result must be combined with clinical observations, patient history, and epidemiological information. The expected result is Negative.  Fact Sheet for Patients:  EntrepreneurPulse.com.au  Fact Sheet for Healthcare Providers:   IncredibleEmployment.be  This test is no t yet approved or cleared by the [place] FDA and  has been authorized for detection and/or diagnosis of SARS-CoV-2 by FDA under an Emergency Use Authorization (EUA). This EUA will remain  in effect (meaning this test can be used) for the duration of the COVID-19 declaration under Section 564(b)(1) of the Act, 21 U.S.C.section 360bbb-3(b)(1), unless the authorization is terminated  or revoked sooner.       Influenza A by PCR NEGATIVE NEGATIVE Final   Influenza B by PCR NEGATIVE NEGATIVE Final    Comment: (NOTE) The Xpert Xpress SARS-CoV-2/FLU/RSV plus assay is intended as an aid in the diagnosis of influenza from Nasopharyngeal swab specimens and should not be used as a sole basis for treatment. Nasal washings and aspirates are unacceptable for Xpert Xpress SARS-CoV-2/FLU/RSV testing.  Fact Sheet for Patients: EntrepreneurPulse.com.au  Fact Sheet for Healthcare Providers: IncredibleEmployment.be  This test is not yet approved or cleared by the [place] FDA and has been authorized for detection and/or diagnosis of SARS-CoV-2 by FDA under an Emergency Use Authorization (EUA). This EUA will remain in effect (meaning this test can be used) for the duration of the COVID-19 declaration under Section 564(b)(1) of the Act, 21 U.S.C. section 360bbb-3(b)(1), unless the authorization is terminated or revoked.  Performed at [facility], [place] [address] [place], [place] [address]   Blood culture (routine x 2)     Status: None (Preliminary result)   Collection Time: 01/21/21 11:03 PM   Specimen: BLOOD  Result Value Ref Range Status   Specimen Description BLOOD RIGHT ANTECUBITAL  Final   Special Requests   Final    BOTTLES DRAWN AEROBIC AND ANAEROBIC Blood Culture results may not be optimal due to an excessive volume of blood received in culture bottles   Culture   Final     NO GROWTH 2 DAYS Performed at [facility], [place] [address] [place], [place] [address]    Report Status PENDING  Incomplete  MRSA Next Gen by PCR, Nasal     Status: Abnormal   Collection Time: 01/22/21  9:55 AM   Specimen: Nasal Mucosa; Nasal Swab  Result Value Ref Range Status   MRSA by PCR Next Gen DETECTED (A) NOT DETECTED Final    Comment: RESULT CALLED TO, READ BACK BY AND VERIFIED WITH: RN J BONEY [id number] AT 1131 BY CM (NOTE) The GeneXpert MRSA Assay (FDA approved for NASAL specimens only), is one component of a comprehensive MRSA colonization surveillance program. It is not intended to diagnose MRSA infection nor to guide or monitor treatment for MRSA infections. Test performance is not FDA approved in patients less than 2 years old. Performed at [facility], [place] [address] [place], [place] [address]   Expectorated Sputum Assessment w Gram Stain, Rflx to Resp Cult     Status: None   Collection Time: 01/23/21 11:13 AM   Specimen: Expectorated Sputum  Result Value Ref Range Status   Specimen Description EXPECTORATED SPUTUM  Final   Special Requests NONE  Final   Sputum evaluation   Final    THIS SPECIMEN IS ACCEPTABLE FOR SPUTUM CULTURE Performed at [facility], [place] [address], [place], [place] [address]    Report Status 01/23/2021 FINAL  Final  Culture, Respiratory w Gram Stain     Status: None (Preliminary result)   Collection Time: 01/23/21 11:13 AM  Result Value Ref Range Status   Specimen Description EXPECTORATED SPUTUM  Final   Special Requests NONE Reflexed from [id number]  Final   Gram Stain   Final    ABUNDANT WBC PRESENT,BOTH PMN AND MONONUCLEAR FEW GRAM POSITIVE COCCI FEW GRAM NEGATIVE RODS    Culture   Final    CULTURE REINCUBATED FOR BETTER GROWTH Performed at [facility], [place] [address], [place], [place] [address]    Report Status PENDING  Incomplete         Radiology Studies: No results found.      Scheduled  Meds:  citalopram  20 mg Oral Daily   enoxaparin (LOVENOX) injection  40 mg Subcutaneous Q24H   gabapentin  300 mg Oral TID   guaiFENesin  1,200 mg Oral BID   ipratropium-albuterol  3 mL Nebulization BID   lactose free nutrition  237 mL Oral TID BM   mometasone-formoterol  2 puff Inhalation BID   multivitamin with minerals  1 tablet Oral Daily   nicotine  14 mg Transdermal Daily   sodium chloride HYPERTONIC  4 mL Nebulization BID   Continuous Infusions:  ceFEPime (MAXIPIME) IV     lactated ringers 50 mL/hr at 01/24/21 0411   vancomycin 500 mg (01/24/21 0816)     LOS: 1 day    Time spent: 35 minutes.     Kayleen Memos, MD [facility]   If 7PM-7AM, please contact night-coverage [email or web address]  01/24/2021, 3:12 PM

## 2021-01-25 DIAGNOSIS — F418 Other specified anxiety disorders: Secondary | ICD-10-CM | POA: Diagnosis not present

## 2021-01-25 DIAGNOSIS — J189 Pneumonia, unspecified organism: Secondary | ICD-10-CM | POA: Diagnosis not present

## 2021-01-25 DIAGNOSIS — E43 Unspecified severe protein-calorie malnutrition: Secondary | ICD-10-CM | POA: Insufficient documentation

## 2021-01-25 LAB — BASIC METABOLIC PANEL WITH GFR
Anion gap: 7 (ref 5–15)
BUN: 19 mg/dL (ref 8–23)
CO2: 28 mmol/L (ref 22–32)
Calcium: 8.9 mg/dL (ref 8.9–10.3)
Chloride: 103 mmol/L (ref 98–111)
Creatinine, Ser: 0.53 mg/dL — ABNORMAL LOW (ref 0.61–1.24)
GFR, Estimated: >60 mL/min
Glucose, Bld: 93 mg/dL (ref 70–99)
Potassium: 3.9 mmol/L (ref 3.5–5.1)
Sodium: 138 mmol/L (ref 135–145)

## 2021-01-25 LAB — CBC
HCT: 34.4 % — ABNORMAL LOW (ref 39.0–52.0)
Hemoglobin: 11.3 g/dL — ABNORMAL LOW (ref 13.0–17.0)
MCH: 30.7 pg (ref 26.0–34.0)
MCHC: 32.8 g/dL (ref 30.0–36.0)
MCV: 93.5 fL (ref 80.0–100.0)
Platelets: 272 K/uL (ref 150–400)
RBC: 3.68 MIL/uL — ABNORMAL LOW (ref 4.22–5.81)
RDW: 13.6 % (ref 11.5–15.5)
WBC: 5.8 K/uL (ref 4.0–10.5)
nRBC: 0 % (ref 0.0–0.2)

## 2021-01-25 LAB — IRON AND TIBC
Iron: 24 ug/dL — ABNORMAL LOW (ref 45–182)
Saturation Ratios: 9 % — ABNORMAL LOW (ref 17.9–39.5)
TIBC: 259 ug/dL (ref 250–450)
UIBC: 235 ug/dL

## 2021-01-25 LAB — FERRITIN: Ferritin: 102 ng/mL (ref 24–336)

## 2021-01-25 LAB — VANCOMYCIN, PEAK: Vancomycin Pk: 23 ug/mL — ABNORMAL LOW (ref 30–40)

## 2021-01-25 MED ORDER — SENNOSIDES-DOCUSATE SODIUM 8.6-50 MG PO TABS
2.0000 | ORAL_TABLET | Freq: Every day | ORAL | Status: DC
  Administered 2021-01-25 – 2021-01-26 (×2): 2 via ORAL
  Filled 2021-01-25 (×2): qty 2

## 2021-01-25 MED ORDER — VANCOMYCIN HCL 750 MG/150ML IV SOLN
750.0000 mg | Freq: Two times a day (BID) | INTRAVENOUS | Status: DC
  Administered 2021-01-25 – 2021-01-26 (×3): 750 mg via INTRAVENOUS
  Filled 2021-01-25 (×3): qty 150

## 2021-01-25 MED ORDER — MUPIROCIN 2 % EX OINT
1.0000 "application " | TOPICAL_OINTMENT | Freq: Two times a day (BID) | CUTANEOUS | Status: DC
  Administered 2021-01-25 – 2021-01-26 (×3): 1 via NASAL
  Filled 2021-01-25: qty 22

## 2021-01-25 MED ORDER — CEFAZOLIN SODIUM-DEXTROSE 1-4 GM/50ML-% IV SOLN
1.0000 g | Freq: Three times a day (TID) | INTRAVENOUS | Status: DC
  Administered 2021-01-25 – 2021-01-26 (×2): 1 g via INTRAVENOUS
  Filled 2021-01-25 (×3): qty 50

## 2021-01-25 MED ORDER — TRAMADOL HCL 50 MG PO TABS
100.0000 mg | ORAL_TABLET | Freq: Three times a day (TID) | ORAL | Status: DC
  Administered 2021-01-25 – 2021-01-26 (×3): 100 mg via ORAL
  Filled 2021-01-25 (×3): qty 2

## 2021-01-25 MED ORDER — CHLORHEXIDINE GLUCONATE CLOTH 2 % EX PADS
6.0000 | MEDICATED_PAD | Freq: Every day | CUTANEOUS | Status: DC
  Administered 2021-01-25 – 2021-01-26 (×2): 6 via TOPICAL

## 2021-01-25 MED ORDER — FERROUS SULFATE 325 (65 FE) MG PO TABS
325.0000 mg | ORAL_TABLET | Freq: Every day | ORAL | Status: DC
  Administered 2021-01-26: 325 mg via ORAL
  Filled 2021-01-25: qty 1

## 2021-01-25 NOTE — Progress Notes (Signed)
Pharmacy Antibiotic Note  [name] is a 70 y.o. male admitted on 01/21/2021 presenting with CP, nausea, SOB.  Pharmacy was consulted on 10/16 for vancomycin and cefepime dosing for pneumonia.    10/20 AM update:  Vancomycin AUC is low at 367 Renal function stable  Plan: Inc vancomycin to 750 mg IV q12h Cefepime 2g IV q12h Re-check vancomycin levels as needed  Height: 6\' 1"  (185.4 cm) Weight: 43.2 kg (95 lb 3.8 oz) IBW/kg (Calculated) : 79.9 Dosing weight = 43.2 actual body weight  Temp (24hrs), Avg:98.3 F (36.8 C), Min:97.4 F (36.3 C), Max:99.2 F (37.3 C)  Recent Labs  Lab 01/21/21 1632 01/21/21 2303 01/22/21 0604 01/23/21 0303 01/24/21 0309 01/24/21 1849 01/24/21 2335  WBC 9.0  --  5.4 6.1 6.2  --   --   CREATININE 0.72  --  0.59* 0.65 0.61  --   --   LATICACIDVEN  --  1.0  --   --   --   --   --   VANCOTROUGH  --   --   --   --   --  9*  --   VANCOPEAK  --   --   --   --   --   --  23*     Estimated Creatinine Clearance: 53.3 mL/min (by C-G formula based on SCr of 0.61 mg/dL).    Allergies  Allergen Reactions   Erythromycin Base Other (See Comments)    hallucinations   Mirtazapine Other (See Comments)   Nsaids Other (See Comments)   Narda Bonds, PharmD, [place] Clinical Pharmacist Phone: [phone]

## 2021-01-25 NOTE — Progress Notes (Signed)
PROGRESS NOTE    [name]  YWV:[id number] DOB: Feb 06, 1951 DOA: 01/21/2021 PCP: Maryella Shivers, MD   Brief Narrative: 70 year old with past medical history significant for COPD, chronic anxiety/depression, recent prolonged hospital stay after trauma complicated by multiple rib fractures with pneumothorax requiring temporary chest tube, subsequent readmission for MRSA pneumonia, bacteremia who presents to [facility] ED for further evaluation of shortness of breath. CTA negative for PE, continued consolidation in the right lower lobe with a small effusion.  Stable 12 mm nodule in the superior segment of the left lower lobe.  Admitted for right lower lobe consolidation/pneumonia with effusion in the setting of recent MRSA Pneumonia bacteremia.  Blood cultures and sputum culture obtained during this admission unrevealing.  Iron studies concerning for significant iron deficiency.  Seen by GI, will follow-up outpatient with Dr. Fuller Plan in 8 to 12 weeks.  01/25/2021: Seen at bedside.  Reports chest congestion, with saturation 100% on room air.  Afebrile no leukocytosis.   Assessment & Plan:   Principal Problem:   Right lower lobe pneumonia Active Problems:   COPD (chronic obstructive pulmonary disease) ([place])   Depression with anxiety   RLL pneumonia   Protein-calorie malnutrition, severe  Right lower lobe consolidation/pneumonia with effusion recent history of MRSA pneumonia bacteremia Continue with IV vancomycin  Cefepime stopped on 01/25/2021 and switched to Ancef. Sputum culture, reincubated for better growth. Blood cultures no growth to date. Pet scan 9/22: (care everywhere) LEFT pulmonary nodule with uptake less than cardiac blood pool on  the current study. Findings suggest indolent process. Indolent  bronchogenic neoplasm remains a differential consideration. RIGHT lower lobe pneumonia. Material in RIGHT lower lobe bronchi  suggest aspiration, associated with small to moderate  RIGHT-sided  pleural effusion.  He will need follow up with pulmonologist.   Iron deficiency anemia Hemoglobin 10.6 Iron deficiency on iron studies. Start ferrous sulfate 325 mg daily.  Severe protein calorie malnutrition BMI 13 Severe muscle mass loss. Dietitian consulted, appreciate assistance. Continue to encourage increase in oral protein calorie intake.  Hypoalbuminemia in the setting of malnutrition Albumin level 2.7 Continue to encourage increase in oral intake.  Chronic anxiety/depression Depression not controlled.  No suicidal ideation. Seen by psychiatry Currently on home BuSpar and Celexa, also on gabapentin 300 mg 3 times daily  COPD without oxygen treatment.:  Continue Dulera and DuoNeb  Left lower lobe pulmonary nodule:  Asked to follow-up with pulmonologist outpatient  Tobacco use disorder: Tobacco cessation counseling provided.  History of colonic polyp/tubular adenoma Previously seen by GI    Estimated body mass index is 12.83 kg/m as calculated from the following:   Height as of this encounter: 6\' 1"  (1.854 m).   Weight as of this encounter: 44.1 kg.   DVT prophylaxis: Lovenox subcu daily. Code Status: Full code Family Communication: Care discussed with patient Disposition Plan:  Status is: Inpatient status. Patient will require least 2 midnights for further evaluation and treatment of present condition.    Consultants:  Psychiatry GI  Procedures:  ECHO  Antimicrobials:  IV vancomycin, 01/22/2021. Cefepime, 01/22/2021>> 01/25/2021. Cefazolin 01/25/2021   Objective: Vitals:   01/25/21 0459 01/25/21 0500 01/25/21 0810 01/25/21 0830  BP: (!) 106/57   (!) 108/59  Pulse:    75  Resp: 11   20  Temp: 98 F (36.7 C)   98.2 F (36.8 C)  TempSrc: Oral   Oral  SpO2:   100% 100%  Weight:  44.1 kg    Height:  Intake/Output Summary (Last 24 hours) at 01/25/2021 1725 Last data filed at 01/25/2021 1551 Gross per 24 hour   Intake 1520 ml  Output 2750 ml  Net -1230 ml   Filed Weights   01/22/21 0900 01/24/21 0546 01/25/21 0500  Weight: 47.6 kg 43.2 kg 44.1 kg    Examination:  General exam: Frail-appearing no acute distress.  He is alert and oriented x3.   Respiratory system: Mild rales at bases no wheezing noted.  Good respiratory effort.   Cardiovascular system: Regular rate and rhythm with no rubs or gallops.   Gastrointestinal system: Soft monitor normal bowel sounds present.   Central nervous system: Alert and awake.  Nonfocal exam.   Skin: No rashes or lesions noted. Psych: Mood is appropriate for condition and setting.  Data Reviewed: I have personally reviewed following labs and imaging studies  CBC: Recent Labs  Lab 01/21/21 1632 01/22/21 0604 01/23/21 0303 01/24/21 0309 01/25/21 0547  WBC 9.0 5.4 6.1 6.2 5.8  NEUTROABS 5.6  --   --   --   --   HGB 13.8 11.6* 10.8* 10.6* 11.3*  HCT 42.1 35.1* 33.1* 32.9* 34.4*  MCV 92.5 93.1 93.8 94.5 93.5  PLT 426* 219 296 265 941   Basic Metabolic Panel: Recent Labs  Lab 01/21/21 1632 01/22/21 0604 01/23/21 0303 01/24/21 0309 01/25/21 0547  NA 138 134* 135 136 138  K 3.9 3.0* 3.8 4.1 3.9  CL 101 103 105 102 103  CO2 27 22 23 26 28   GLUCOSE 93 109* 98 90 93  BUN 22 18 20 21 19   CREATININE 0.72 0.59* 0.65 0.61 0.53*  CALCIUM 9.6 8.3* 8.5* 8.7* 8.9  MG  --  1.9  --  1.9  --   PHOS  --  3.5  --  3.8  --    GFR: Estimated Creatinine Clearance: 54.4 mL/min (A) (by C-G formula based on SCr of 0.53 mg/dL (L)). Liver Function Tests: Recent Labs  Lab 01/21/21 1632 01/24/21 0309  AST 26 20  ALT 21 16  ALKPHOS 86 70  BILITOT 0.6 0.4  PROT 7.6 5.9*  ALBUMIN 3.4* 2.7*   No results for input(s): LIPASE, AMYLASE in the last 168 hours. No results for input(s): AMMONIA in the last 168 hours. Coagulation Profile: No results for input(s): INR, PROTIME in the last 168 hours. Cardiac Enzymes: No results for input(s): CKTOTAL, CKMB,  CKMBINDEX, TROPONINI in the last 168 hours. BNP (last 3 results) No results for input(s): PROBNP in the last 8760 hours. HbA1C: No results for input(s): HGBA1C in the last 72 hours. CBG: No results for input(s): GLUCAP in the last 168 hours. Lipid Profile: No results for input(s): CHOL, HDL, LDLCALC, TRIG, CHOLHDL, LDLDIRECT in the last 72 hours. Thyroid Function Tests: Recent Labs    01/24/21 0633  TSH 4.433   Anemia Panel: Recent Labs    01/24/21 0309 01/24/21 0633  FERRITIN  --  102  TIBC  --  259  IRON  --  24*  RETICCTPCT 0.9  --    Sepsis Labs: Recent Labs  Lab 01/21/21 2303 01/22/21 0604  PROCALCITON  --  <0.10  LATICACIDVEN 1.0  --     Recent Results (from the past 240 hour(s))  Blood culture (routine x 2)     Status: None (Preliminary result)   Collection Time: 01/21/21  7:59 PM   Specimen: BLOOD  Result Value Ref Range Status   Specimen Description BLOOD LEFT ARM  Final   Special  Requests   Final    BOTTLES DRAWN AEROBIC AND ANAEROBIC Blood Culture adequate volume   Culture   Final    NO GROWTH 4 DAYS Performed at [facility], [place] [address], [place], [place] [address]    Report Status PENDING  Incomplete  Resp Panel by RT-PCR (Flu A&B, Covid) Nasopharyngeal Swab     Status: None   Collection Time: 01/21/21  8:55 PM   Specimen: Nasopharyngeal Swab; Nasopharyngeal(NP) swabs in vial transport medium  Result Value Ref Range Status   SARS Coronavirus 2 by RT PCR NEGATIVE NEGATIVE Final    Comment: (NOTE) SARS-CoV-2 target nucleic acids are NOT DETECTED.  The SARS-CoV-2 RNA is generally detectable in upper respiratory specimens during the acute phase of infection. The lowest concentration of SARS-CoV-2 viral copies this assay can detect is 138 copies/mL. A negative result does not preclude SARS-Cov-2 infection and should not be used as the sole basis for treatment or other patient management decisions. A negative result may occur with  improper  specimen collection/handling, submission of specimen other than nasopharyngeal swab, presence of viral mutation(s) within the areas targeted by this assay, and inadequate number of viral copies(<138 copies/mL). A negative result must be combined with clinical observations, patient history, and epidemiological information. The expected result is Negative.  Fact Sheet for Patients:  EntrepreneurPulse.com.au  Fact Sheet for Healthcare Providers:  IncredibleEmployment.be  This test is no t yet approved or cleared by the [place] FDA and  has been authorized for detection and/or diagnosis of SARS-CoV-2 by FDA under an Emergency Use Authorization (EUA). This EUA will remain  in effect (meaning this test can be used) for the duration of the COVID-19 declaration under Section 564(b)(1) of the Act, 21 U.S.C.section 360bbb-3(b)(1), unless the authorization is terminated  or revoked sooner.       Influenza A by PCR NEGATIVE NEGATIVE Final   Influenza B by PCR NEGATIVE NEGATIVE Final    Comment: (NOTE) The Xpert Xpress SARS-CoV-2/FLU/RSV plus assay is intended as an aid in the diagnosis of influenza from Nasopharyngeal swab specimens and should not be used as a sole basis for treatment. Nasal washings and aspirates are unacceptable for Xpert Xpress SARS-CoV-2/FLU/RSV testing.  Fact Sheet for Patients: EntrepreneurPulse.com.au  Fact Sheet for Healthcare Providers: IncredibleEmployment.be  This test is not yet approved or cleared by the [place] FDA and has been authorized for detection and/or diagnosis of SARS-CoV-2 by FDA under an Emergency Use Authorization (EUA). This EUA will remain in effect (meaning this test can be used) for the duration of the COVID-19 declaration under Section 564(b)(1) of the Act, 21 U.S.C. section 360bbb-3(b)(1), unless the authorization is terminated or revoked.  Performed at  [facility], [place] [address] [place], [place] [address]   Blood culture (routine x 2)     Status: None (Preliminary result)   Collection Time: 01/21/21 11:03 PM   Specimen: BLOOD  Result Value Ref Range Status   Specimen Description BLOOD RIGHT ANTECUBITAL  Final   Special Requests   Final    BOTTLES DRAWN AEROBIC AND ANAEROBIC Blood Culture results may not be optimal due to an excessive volume of blood received in culture bottles   Culture   Final    NO GROWTH 3 DAYS Performed at [facility], [place] [address], [place],  [address]    Report Status PENDING  Incomplete  MRSA Next Gen by PCR, Nasal     Status: Abnormal   Collection  Time: 01/22/21  9:55 AM   Specimen: Nasal Mucosa; Nasal Swab  Result Value Ref Range Status   MRSA by PCR Next Gen DETECTED (A) NOT DETECTED Final    Comment: RESULT CALLED TO, READ BACK BY AND VERIFIED WITH: RN J BONEY [id number] AT 1131 BY CM (NOTE) The GeneXpert MRSA Assay (FDA approved for NASAL specimens only), is one component of a comprehensive MRSA colonization surveillance program. It is not intended to diagnose MRSA infection nor to guide or monitor treatment for MRSA infections. Test performance is not FDA approved in patients less than 67 years old. Performed at [facility], [place] [address] [place], [place] [address]   Expectorated Sputum Assessment w Gram Stain, Rflx to Resp Cult     Status: None   Collection Time: 01/23/21 11:13 AM   Specimen: Expectorated Sputum  Result Value Ref Range Status   Specimen Description EXPECTORATED SPUTUM  Final   Special Requests NONE  Final   Sputum evaluation   Final    THIS SPECIMEN IS ACCEPTABLE FOR SPUTUM CULTURE Performed at [facility], [place] [address] [place], [place] [address]    Report Status 01/23/2021 FINAL  Final  Culture, Respiratory w Gram Stain     Status: None (Preliminary result)   Collection Time: 01/23/21 11:13 AM  Result Value Ref Range Status    Specimen Description EXPECTORATED SPUTUM  Final   Special Requests NONE Reflexed from [id number]  Final   Gram Stain   Final    ABUNDANT WBC PRESENT,BOTH PMN AND MONONUCLEAR FEW GRAM POSITIVE COCCI FEW GRAM NEGATIVE RODS    Culture   Final    RARE STAPHYLOCOCCUS AUREUS SUSCEPTIBILITIES TO FOLLOW CULTURE REINCUBATED FOR BETTER GROWTH Performed at [facility], [place] [address], [place], [place] [address]    Report Status PENDING  Incomplete         Radiology Studies: No results found.      Scheduled Meds:  Chlorhexidine Gluconate Cloth  6 each Topical Q0600   citalopram  20 mg Oral Daily   enoxaparin (LOVENOX) injection  40 mg Subcutaneous Q24H   gabapentin  300 mg Oral TID   ipratropium-albuterol  3 mL Nebulization BID   lactose free nutrition  237 mL Oral TID BM   mometasone-formoterol  2 puff Inhalation BID   multivitamin with minerals  1 tablet Oral Daily   mupirocin ointment  1 application Nasal BID   nicotine  14 mg Transdermal Daily   senna-docusate  2 tablet Oral Q0600   traMADol  100 mg Oral Q8H   Continuous Infusions:   ceFAZolin (ANCEF) IV     vancomycin 750 mg (01/25/21 0921)     LOS: 2 days    Time spent: 35 minutes.     Kayleen Memos, MD [facility]   If 7PM-7AM, please contact night-coverage [email or web address]  01/25/2021, 5:25 PM

## 2021-01-25 NOTE — Consult Note (Signed)
[facility] New Psychiatric Evaluation   Service Date: January 25, 2021 LOS:  LOS: 2 days    Assessment  [name] is a 70 y.o. male admitted medically for 01/21/2021  4:14 PM for pneumonia. He carries the psychiatric diagnoses of MDD, GAD and has a past medical history most significant for COPD.Psychiatry was consulted for depression by [name], [name].    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 Interview is complicated by pt's overall negative view on psychiatry (he does not consider citalopram a psych med) and feelings of stigmatization from being placed on suicide precautions. Current outpatient psychotropic medications include citalopram and buspirone and historically he has had a good response to citalopram and a poor response to buspirone. He was compliant compliant with medications prior to admission. On initial examination, patient denies any current or prior suicidality and would be interested in raising his dose of citalopram to previously effective 40 mg dose when more medically stable. He is currently not interested in working with therapy intern.  Please see plan below for detailed recommendations.   10/20: some improvement of mood with improvement of mental status; no desire for further psychotropic changes. Psychiatry will sign off.   Diagnoses:  Active Hospital problems: Principal Problem:   Right lower lobe pneumonia Active Problems:   COPD (chronic obstructive pulmonary disease) (HCC)   Depression with anxiety   RLL pneumonia   Protein-calorie malnutrition, severe    Problems edited/added by me: No problems updated.  Plan  ## Safety and Observation Level:  - Based on my clinical evaluation, I estimate the patient to be at low risk of self harm in the current setting - At this time, we recommend a routine level of observation. This decision is based  on my review of the chart including patient's history and current presentation, interview of the patient, mental status examination, and consideration of suicide risk including evaluating suicidal ideation, plan, intent, suicidal or self-harm behaviors, risk factors, and protective factors. This judgment is based on our ability to directly address suicide risk, implement suicide prevention strategies and develop a safety plan while the patient is in the clinical setting. Please contact our team if there is a concern that risk level has changed.   ## Medications:  -- c citalopram 20 mg -- dc buspirone 5 mg TID  Patient has overall (-) view of psychiatry and denies psychiatric symptoms when framed as such. It may be worth involving palliative care over psychiatry for some complaints (low appetite, etc) given above.   ## Medical Decision Making Capacity:  Not formally assessed  ## Further Work-up:  -- B12 wnl,  -- please obtain TSH   ## Disposition:  -- per medical team  Thank you for this consult request. Recommendations have been communicated to the primary team.  We will continue to follow at this time.   [place] A [name]    NEW  history  Relevant Aspects of Hospital Course:  Admitted on 01/21/2021 for shortness of breath. Has generally been declining since a fall from a ladder on June 30 resulting in multiple left-sided rib fractures.  This was complicated by pneumothorax requiring hospitalization and temporary chest tube placement which was since removed.  He was subsequently readmitted at [facility] 10/26/2020-11/01/2020 with pneumonia and was found to have MRSA bacteremia.  He was discharged to SNF but then admitted to [facility] 11/05/2020-11/08/2020 with  acute on chronic hypoxemic respiratory failure due to MRSA pneumonia.  He completed full course of antibiotics with vancomycin and discharged back to SNF. Has been feeling more depressed, no SI/HI; argued with  daughter prior to coming to hospital.    Has a pulmonary nodule (stable x years, recent PET ct with no increased uptake). Has hx GI polyps (colonoscopy due in 2021). 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   Per chart review, multiple recent changes to psychotropic medications - on citalopram 20 mg (LF 9/23), ?venlafaxine 150 mg (LF 9/8), buproprion 150 mg (LF 9/27), buspirone 5 mg TID (LF 10/11), klonopin 1 mg BID (LF 9/23) - also filled trazodone back in July.   Review of notes through hospitalization indicate pt consistently denying SI.   No significant events since last reviewed  Patient Report:  Pt remembered this author from earlier in the week. Was grateful to have explanations of why he was on suicide precautions (and to have them discontinued). Has been having some improvement in mood - both physically feeling better and d/c buspirone which he had had poor response to. Wants to give citalopram at current dose a chance to work; plans to discuss changes with PCP. Has been thinking about comments on gun safety - planning to get a safe (or at least move guns away from easy access) due to grandchildren. Continues to deny SI, HI, AH/VH. Appetite is much better, sleeping poorly (was woken up for 2 hours last night to get IV replaced, no intrinsic insomnia). Hasn't talked to daughter but had a good conversation with son yesterday who was dc from hospital.   ROS:  Weakness  Psychiatric History:  See initial consult note  Medical History: Past Medical History:  Diagnosis Date  . Anxiety   . Arthritis   . Cataract   . COPD (chronic obstructive pulmonary disease) (HCC)    inhaler  . Depression   . 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 BACK SURGERY    . LUMBAR DISC SURGERY Left 06/2003   L4 on L3 hemilaminectomy, foraminotomy for 3-4,4-5  with left L3-L4 extraforaminal diskectomy/notes 08/22/2010   . REPAIR DURAL / CSF LEAK  07/2003   [name] 08/22/2010    Medications:   Current Facility-Administered Medications:  .  acetaminophen (TYLENOL) tablet 1,000 mg, 1,000 mg, Oral, Q6H PRN, 1,000 mg at 01/25/21 1603 **OR** acetaminophen (TYLENOL) suppository 650 mg, 650 mg, Rectal, Q6H PRN, [name], [name], [name] .  albuterol (PROVENTIL) (2.5 MG/3ML) 0.083% nebulizer solution 2.5 mg, 2.5 mg, Inhalation, Q6H PRN, [name], [name], [name] .  ceFAZolin (ANCEF) IVPB 1 g/50 mL premix, 1 g, Intravenous, Q8H, [name], [name], [name] .  Chlorhexidine Gluconate Cloth 2 % PADS 6 each, 6 each, Topical, Q0600, Tyler Memos, [name], 6 each at 01/25/21 1258 .  citalopram (CELEXA) tablet 20 mg, 20 mg, Oral, Daily, Tyler Finders [name], [name], 20 mg at 01/25/21 [phone] .  enoxaparin (LOVENOX) injection 40 mg, 40 mg, Subcutaneous, Q24H, Tyler Finders [name], [name], 40 mg at 01/24/21 2140 .  gabapentin (NEURONTIN) capsule 300 mg, 300 mg, Oral, TID, Tyler Finders [name], [name], 300 mg at 01/25/21 1600 .  ipratropium-albuterol (DUONEB) 0.5-2.5 (3) MG/3ML nebulizer solution 3 mL, 3 mL, Nebulization, BID, [name], [name], [name], 3 mL at 01/25/21 0807 .  lactose free nutrition (Boost) liquid 237 mL, 237  mL, Oral, TID BM, Tyler Finders [name], [name], 237 mL at 01/25/21 1258 .  mometasone-formoterol (DULERA) 200-5 MCG/ACT inhaler 2 puff, 2 puff, Inhalation, BID, Tyler Cordia, [name], 2 puff at 01/25/21 0810 .  multivitamin with minerals tablet 1 tablet, 1 tablet, Oral, Daily, Tyler Memos, [name], 1 tablet at 01/25/21 [phone] .  mupirocin ointment (BACTROBAN) 2 % 1 application, 1 application, Nasal, BID, Tyler Memos, [name], 1 application at 93/79/02 1402 .  nicotine (NICODERM CQ - dosed in mg/24 hours) patch 14 mg, 14 mg, Transdermal, Daily, Tyler Finders [name], [name], 14 mg at 01/25/21 0916 .  ondansetron (ZOFRAN) tablet 4 mg, 4 mg, Oral, Q6H PRN **OR** ondansetron (ZOFRAN) injection 4 mg, 4 mg, Intravenous, Q6H PRN, [name], Tyler  [name], [name] .  polyethylene glycol (MIRALAX / GLYCOLAX) packet 17 g, 17 g, Oral, Daily PRN, Tyler Memos, [name], 17 g at 01/24/21 1858 .  senna-docusate (Senokot-S) tablet 2 tablet, 2 tablet, Oral, Q0600, Tyler Memos, [name], 2 tablet at 01/25/21 1158 .  traMADol (ULTRAM) tablet 100 mg, 100 mg, Oral, Q8H, [name], [name], [name], 100 mg at 01/25/21 1454 .  traZODone (DESYREL) tablet 25-50 mg, 25-50 mg, Oral, QHS PRN, Tyler Cordia, [name], 50 mg at 01/24/21 2132 .  vancomycin (VANCOREADY) IVPB 750 mg/150 mL, 750 mg, Intravenous, Q12H, [name], [name], Last Rate: 150 mL/hr at 01/25/21 0921, 750 mg at 01/25/21 4097  Allergies: Allergies  Allergen Reactions  . Erythromycin Base Other (See Comments)    hallucinations  . Mirtazapine Other (See Comments)  . Nsaids Other (See Comments)    Social History/Family:  See initial consult note   Objective  Vital signs:  Temp:  [98 F (36.7 C)-98.3 F (36.8 C)] 98.2 F (36.8 C) (10/20 0830) Pulse Rate:  [75] 75 (10/20 0830) Resp:  [11-20] 20 (10/20 0830) BP: (106-108)/(57-59) 108/59 (10/20 0830) SpO2:  [98 %-100 %] 100 % (10/20 0830) Weight:  [44.1 kg] 44.1 kg (10/20 0500)  Physical Exam: Gen: thin,  home clothing  Head: normacephalic/atraumatic Pulm: on RA,  no increased WOB while lying in bed Psych: A&Ox4  Mental Status Exam: Appearance: Thin, well groomed  Attitude:  Cooperative, pleasant  Behavior/Psychomotor: No increased rate of gesturing, no psychomotor slowing noted  Speech/Language:  Nl rate, amount, spontaneity, etc  Mood: A little better  Affect: Congruent, appropriate to conversation  Thought process: Clear, coherent, goal directed  Thought content:   Devoid of SI/HI/delusions/paranoia  Perceptual disturbances:  Not endorsed, not RIS  Attention: Good   Concentration:   Orientation: full  Memory: Recent and remote intact  Fund of knowledge:    Insight:     Judgment:  good  Impulse Control: good

## 2021-01-25 NOTE — Consult Note (Addendum)
Referring Provider: [facility] PCP: Maryella Shivers, MD  Gastroenterologist:  Lucio Edward, MD Reason for consultation:     weight loss and anemia             ASSESSMENT / PLAN   # [place] anemia.  Baseline hgb hard to discern. In July 2022 it was 6-8 but that was around time of admission [facility]) for trauma after falling off a ladder. Prior to that his hgb was 14.7 back in 2020. This admission he has presented with hgb of 13.8 which declined to mid 10-11 range.  Ferritin 102 but in setting of PNA . Normal TIBC with low iron percent saturation so ? Iron deficiency anemia. Suspect some of his anemia is related to nutritional deficits. He hasn't had any overt GI bleeding but does take NSAIDS on a regular basis.  --Patient doesn't need to be NPO from GI standpoint. No procedures today. At some point he will need surveillance colonoscopy at which time it would not be unreasonable to do an EGD. This could possible be done outpatient.   # RLL PNA / effusion in setting of recent MRSA pneumonia bacteremia  # Severe malnutrition / weight loss. Probably multifactorial ( depression / prolonged illness). Denies GI symptoms   # Chronic anxiety / depression.  Psychiatry following  # History of adenomatous colon polyps. He was due for a 3 year surveillance colonoscopy in April 2021 ( not done)  # Additional medical history listed below.   HISTORY OF PRESENT ILLNESS                                                                                                                         Chief Complaint: none from patient  [name] is a 70 y.o. male with a past medical history significant for COPD, adenomatous colon polyps,tobacco abuse See PMH for any additional medical history.   In August patient had a prolonged hospitalization with rib fractures complicated by pneumothorax and chest tube placement and ultimate readmission with recurrent MRSA pneumonia and MRSA bacteremia to the  emergency department for evaluation of shortness of breath and depression. CTA negative for PE but + right sided effusion with right sided consolidation concerning for PNA.    ED course:  Patient presented to ED on 10/16 with left sided chest pain, nausea and SOB. He reported depression as well.  We were asked to evaluate the patient for anemia.  His baseline hemoglobin is hard to discern.  In 2020 it was 14.7.  The next available hemoglobin was in July 2022 at which time it was 8.1 but that was in the setting of trauma.  He had a prolonged admission for rib fractures complicated by pneumothorax /chest tube placement.  He was subsequently readmitted for MRSA pneumonia/bacteremia.   Patient has not had any overt GI blood loss in the form of black stools or bright red blood.  He denies abdominal pain.  No nausea nor vomiting.  Patient  says that he has lost weight due prolonged illness related to falling off the ladder over the summer.  Since the fall he has taken NSAIDs on a regular basis.  He realizes that he is overdue for surveillance colonoscopy.  Patient says he moved to [facility] and put the colonoscopy on hold. It was due April 2021   No history of heavy alcohol use  Imaging:  No results found.   PREVIOUS ENDOSCOPIC EVALUATIONS  / IMAGING STUDIES  Polyp surveillance colonoscopy April  2018 -One 17 mm polyp at the recto-sigmoid colon, removed with a hot snare. Resected andretrieved. - Three 6 to 7 mm polyps in the sigmoid colon and in the transverse colon, removed with acold snare. Resected and retrieved. - One 9 mm polyp in the transverse colon, removed with a hot snare. Resected and retrieved. - The examination was otherwise normal on direct and retroflexion views.  Diagnosis 1. Surgical [P], transverse-2 and sigmoid-2, polyps (4) - TUBULAR ADENOMA (3 OF 5 FRAGMENTS) - BENIGN COLONIC MUCOSA (2 OF 5 FRAGMENTS) - NO HIGH GRADE DYSPLASIA OR MALIGNANCY IDENTIFIED 2. Surgical [P],  recto/sigmoid, polyp - TUBULAR ADENOMA (1 OF 1 FRAGMENTS) - NO HIGH GRADE DYSPLASIA OR MALIGNANCY IDENTIFIED  Past Medical History:  Diagnosis Date   Anxiety    Arthritis    Cataract    COPD (chronic obstructive pulmonary disease) ([place])    inhaler   Depression    Neuromuscular disorder ([place])    bulding disc    Past Surgical History:  Procedure Laterality Date   BACK SURGERY     LUMBAR DISC SURGERY Left 06/2003   L4 on L3 hemilaminectomy, foraminotomy for 3-4,4-5 with left L3-L4 extraforaminal diskectomy/notes 08/22/2010    REPAIR DURAL / CSF LEAK  07/2003   Archie Endo 08/22/2010    Prior to Admission medications   Medication Sig Start Date End Date Taking? Authorizing Provider  Artificial Tear Ointment (DRY EYES OP) Place 2 drops into both eyes 2 (two) times daily as needed (dry eyes).   Yes [provider]  busPIRone (BUSPAR) 5 MG tablet Take 5 mg by mouth 3 (three) times daily. 01/16/21  Yes [provider]  citalopram (CELEXA) 20 MG tablet Take 20 mg by mouth daily. 12/29/20  Yes [provider]  fluticasone-salmeterol (ADVAIR) 500-50 MCG/ACT AEPB Inhale 1 puff into the lungs in the morning and at bedtime.   Yes [provider]  gabapentin (NEURONTIN) 300 MG capsule Take 300 mg by mouth 3 (three) times daily.   Yes [provider]  ipratropium-albuterol (DUONEB) 0.5-2.5 (3) MG/3ML SOLN Take 3 mLs by nebulization 4 (four) times daily. 12/28/20  Yes [provider]  lactose free nutrition (BOOST) LIQD Take 237 mLs by mouth 3 (three) times daily between meals.   Yes [provider]  Multiple Vitamins-Minerals (CENTRUM SILVER 50+MEN) TABS Take 1 tablet by mouth daily.   Yes [provider]  nicotine (NICODERM CQ - DOSED IN MG/24 HOURS) 21 mg/24hr patch Place 21 mg onto the skin every morning.   Yes [provider]  promethazine (PHENERGAN) 25 MG tablet Take 25 mg by mouth every 8 (eight) hours as needed for  nausea or vomiting.   Yes [provider]  traMADol (ULTRAM) 50 MG tablet Take 100 mg by mouth 3 (three) times daily. 01/15/21  Yes [provider]  traZODone (DESYREL) 50 MG tablet Take 25-50 mg by mouth at bedtime as needed for sleep. 02/13/20  Yes [provider]  buPROPion (WELLBUTRIN XL)  150 MG 24 hr tablet Take 150 mg by mouth daily. 01/02/21   [provider]  clonazePAM (KLONOPIN) 1 MG tablet Take 1 tablet (1 mg total) by mouth 2 (two) times daily. Patient not taking: No sig reported 11/08/20   Barb Merino, MD  magic mouthwash w/lidocaine SOLN Take 5 mLs by mouth 4 (four) times daily. Swish and swallow Patient not taking: No sig reported    [provider]    Current Facility-Administered Medications  Medication Dose Route Frequency Provider Last Rate Last Admin   acetaminophen (TYLENOL) tablet 1,000 mg  1,000 mg Oral Q6H PRN Lenore Cordia, MD   1,000 mg at 01/24/21 1603   Or   acetaminophen (TYLENOL) suppository 650 mg  650 mg Rectal Q6H PRN Lenore Cordia, MD       albuterol (PROVENTIL) (2.5 MG/3ML) 0.083% nebulizer solution 2.5 mg  2.5 mg Inhalation Q6H PRN Lenore Cordia, MD       ceFEPIme (MAXIPIME) 2 g in sodium chloride 0.9 % 100 mL IVPB  2 g Intravenous Q12H Wendee Beavers, RPH 200 mL/hr at 01/25/21 0840 2 g at 01/25/21 0840   citalopram (CELEXA) tablet 20 mg  20 mg Oral Daily Zada Finders R, MD   20 mg at 01/25/21 0833   enoxaparin (LOVENOX) injection 40 mg  40 mg Subcutaneous Q24H Zada Finders R, MD   40 mg at 01/24/21 2140   gabapentin (NEURONTIN) capsule 300 mg  300 mg Oral TID Lenore Cordia, MD   300 mg at 01/25/21 8676   ipratropium-albuterol (DUONEB) 0.5-2.5 (3) MG/3ML nebulizer solution 3 mL  3 mL Nebulization BID Irene Pap N, DO   3 mL at 01/25/21 0807   lactose free nutrition (Boost) liquid 237 mL  237 mL Oral TID BM Zada Finders R, MD   237 mL at 01/24/21 2100   mometasone-formoterol (DULERA) 200-5 MCG/ACT inhaler  2 puff  2 puff Inhalation BID Lenore Cordia, MD   2 puff at 01/25/21 0810   multivitamin with minerals tablet 1 tablet  1 tablet Oral Daily Irene Pap N, DO   1 tablet at 01/25/21 7209   nicotine (NICODERM CQ - dosed in mg/24 hours) patch 14 mg  14 mg Transdermal Daily Zada Finders R, MD   14 mg at 01/25/21 0916   ondansetron (ZOFRAN) tablet 4 mg  4 mg Oral Q6H PRN Lenore Cordia, MD       Or   ondansetron (ZOFRAN) injection 4 mg  4 mg Intravenous Q6H PRN Lenore Cordia, MD       polyethylene glycol (MIRALAX / GLYCOLAX) packet 17 g  17 g Oral Daily PRN Irene Pap N, DO   17 g at 01/24/21 1858   senna-docusate (Senokot-S) tablet 2 tablet  2 tablet Oral Q0600 Irene Pap N, DO       traMADol (ULTRAM) tablet 100 mg  100 mg Oral Q8H Hall, Carole N, DO       traZODone (DESYREL) tablet 25-50 mg  25-50 mg Oral QHS PRN Zada Finders R, MD   50 mg at 01/24/21 2132   vancomycin (VANCOREADY) IVPB 750 mg/150 mL  750 mg Intravenous Q12H Erenest Blank, RPH 150 mL/hr at 01/25/21 0921 750 mg at 01/25/21 4709    Allergies as of 01/21/2021 - Review Complete 01/21/2021  Allergen Reaction Noted   Erythromycin base Other (See Comments) 05/23/2013   Mirtazapine Other (See Comments) 01/14/2020   Nsaids Other (See Comments) 01/14/2020  Family History  Problem Relation Age of Onset   Diabetes Father    Colon polyps Father    Heart disease Father    Stroke Father    Heart failure Father    Diabetes Brother    Muscular dystrophy Mother    Diabetes Paternal Uncle    Colon cancer Paternal Grandfather    Diabetes Son     Social History   Socioeconomic History   Marital status: Married    Spouse name: Not on file   Number of children: Not on file   Years of education: Not on file   Highest education level: Not on file  Occupational History   Not on file  Tobacco Use   Smoking status: Every Day    Packs/day: 1.50    Years: 43.00    Pack years: 64.50    Types: Cigarettes   Smokeless  tobacco: Never   Tobacco comments:    tried chantix couldn't take it  Vaping Use   Vaping Use: Never used  Substance and Sexual Activity   Alcohol use: Not Currently    Alcohol/week: 6.0 standard drinks    Types: 6 Cans of beer per week    Comment: 2 times a week   Drug use: No   Sexual activity: Not Currently  Other Topics Concern   Not on file  Social History Narrative   Right handed   Lives wife in a one story home   Social Determinants of Health   Financial Resource Strain: Not on file  Food Insecurity: Not on file  Transportation Needs: Not on file  Physical Activity: Not on file  Stress: Not on file  Social Connections: Not on file  Intimate Partner Violence: Not on file    Review of Systems: All systems reviewed and negative except where noted in HPI.   OBJECTIVE    Physical Exam: Vital signs in last 24 hours: Temp:  [98 F (36.7 C)-98.8 F (37.1 C)] 98.2 F (36.8 C) (10/20 0830) Pulse Rate:  [75-77] 75 (10/20 0830) Resp:  [11-20] 20 (10/20 0830) BP: (99-108)/(57-63) 108/59 (10/20 0830) SpO2:  [95 %-100 %] 100 % (10/20 0830) Weight:  [44.1 kg] 44.1 kg (10/20 0500)   General:   Alert  thin male in NAD Psych:  Pleasant, cooperative. Normal mood and affect. Eyes:  Pupils equal, sclera clear, no icterus.   Conjunctiva pink. Ears:  Normal auditory acuity. Nose:  No deformity, discharge,  or lesions. Neck:  Supple; no masses Lungs:  Clear throughout to auscultation.   No wheezes, crackles, or rhonchi.  Heart:  Regular rate and rhythm;  no lower extremity edema Abdomen:  Soft, non-distended, nontender, BS active, no palp mass   Rectal:  Deferred  Msk:  Symmetrical without gross deformities. . Neurologic:  Alert and  oriented x4;  grossly normal neurologically. Skin:  Intact without significant lesions or rashes.   Scheduled inpatient medications  citalopram  20 mg Oral Daily   enoxaparin (LOVENOX) injection  40 mg Subcutaneous Q24H   gabapentin  300  mg Oral TID   ipratropium-albuterol  3 mL Nebulization BID   lactose free nutrition  237 mL Oral TID BM   mometasone-formoterol  2 puff Inhalation BID   multivitamin with minerals  1 tablet Oral Daily   nicotine  14 mg Transdermal Daily   senna-docusate  2 tablet Oral Q0600   traMADol  100 mg Oral Q8H      Intake/Output from previous day: 10/19 0701 - 10/20  0700 In: 2000 [P.O.:1400; I.V.:500; IV Piggyback:100] Out: 3110 [Urine:3110] Intake/Output this shift: No intake/output data recorded.   Lab Results: Recent Labs    01/23/21 0303 01/24/21 0309 01/25/21 0547  WBC 6.1 6.2 5.8  HGB 10.8* 10.6* 11.3*  HCT 33.1* 32.9* 34.4*  PLT 296 265 272   BMET Recent Labs    01/23/21 0303 01/24/21 0309 01/25/21 0547  NA 135 136 138  K 3.8 4.1 3.9  CL 105 102 103  CO2 23 26 28   GLUCOSE 98 90 93  BUN 20 21 19   CREATININE 0.65 0.61 0.53*  CALCIUM 8.5* 8.7* 8.9   LFTs Recent Labs    01/24/21 0309  PROT 5.9*  ALBUMIN 2.7*  AST 20  ALT 16  ALKPHOS 70  BILITOT 0.4   PT/INR No results for input(s): LABPROT, INR in the last 72 hours. Hepatitis Panel No results for input(s): HEPBSAG, HCVAB, HEPAIGM, HEPBIGM in the last 72 hours.   . CBC Latest Ref Rng & Units 01/25/2021 01/24/2021 01/23/2021  WBC 4.0 - 10.5 K/uL 5.8 6.2 6.1  Hemoglobin 13.0 - 17.0 g/dL 11.3(L) 10.6(L) 10.8(L)  Hematocrit 39.0 - 52.0 % 34.4(L) 32.9(L) 33.1(L)  Platelets 150 - 400 K/uL 272 265 296    . CMP Latest Ref Rng & Units 01/25/2021 01/24/2021 01/23/2021  Glucose 70 - 99 mg/dL 93 90 98  BUN 8 - 23 mg/dL 19 21 20   Creatinine 0.61 - 1.24 mg/dL 0.53(L) 0.61 0.65  Sodium 135 - 145 mmol/L 138 136 135  Potassium 3.5 - 5.1 mmol/L 3.9 4.1 3.8  Chloride 98 - 111 mmol/L 103 102 105  CO2 22 - 32 mmol/L 28 26 23   Calcium 8.9 - 10.3 mg/dL 8.9 8.7(L) 8.5(L)  Total Protein 6.5 - 8.1 g/dL - 5.9(L) -  Total Bilirubin 0.3 - 1.2 mg/dL - 0.4 -  Alkaline Phos 38 - 126 U/L - 70 -  AST 15 - 41 U/L - 20 -   ALT 0 - 44 U/L - 16 -     Principal Problem:   Right lower lobe pneumonia Active Problems:   COPD (chronic obstructive pulmonary disease) ([facility])   Depression with anxiety   RLL pneumonia    Tye Savoy, NP-C @  01/25/2021, 10:43 AM   Attending physician's note   I have taken an interval history, reviewed the chart and examined the patient. I agree with the Advanced Practitioner's note, impression and recommendations.   70yr old with prolonged hospitalization d/t rib #s complicated by pneumothorax requiring chest tube and then readm with MRSA pneumonia/bacteremia. GI being consulted d/t anemia without any overt bleeding. Hb stable at 10-11 range. Also with H/O significant colonic polyps 07/2016, due for repeat colon.  He has assoc significant wt loss/malnutrition.  Recommend -Outpt GI work-up. Once better, he would need EGD/colon. -Encourage p.o. intake -Follow-up GI clinic as outpt with Dr. Fuller Plan in 8-12 weeks. -Please call with any ?Marland Kitchen Will stand by for now    Carmell Austria, MD [facility] [phone]

## 2021-01-26 LAB — CULTURE, RESPIRATORY W GRAM STAIN

## 2021-01-26 LAB — CULTURE, BLOOD (ROUTINE X 2)
Culture: NO GROWTH
Special Requests: ADEQUATE

## 2021-01-26 MED ORDER — FERROUS SULFATE 325 (65 FE) MG PO TABS: 325.0000 mg | ORAL_TABLET | Freq: Every day | ORAL | 0 refills | Status: DC

## 2021-01-26 MED ORDER — POLYETHYLENE GLYCOL 3350 17 G PO PACK: 17.0000 g | PACK | Freq: Every day | ORAL | 0 refills | Status: DC | PRN

## 2021-01-26 MED ORDER — MUPIROCIN 2 % EX OINT: 1.0000 "application " | TOPICAL_OINTMENT | Freq: Two times a day (BID) | CUTANEOUS | 0 refills | Status: DC

## 2021-01-26 NOTE — Evaluation (Signed)
Physical Therapy Evaluation Patient Details Name: [name] MRN: [id number] DOB: Apr 26, 1950 Today's Date: 01/26/2021  History of Present Illness  Pt is a 70 y/o male admitted 10/18 for shortness of breath. CTA negative for PE, admitted for R lower lobe PNA with effusion. PMH includes: COPD, chronic anxiety/depression, anxiety, arthritis,neuromuscular disorder, recent prolonged hospital stay after trauma complicated by multiple rib fractures with pneumothorax requiring temporary chest tube, subsequent readmission for MRSA pneumonia, bacteremia.  Clinical Impression  Pt doing well with mobility and no further PT needed.  Ready for dc from PT standpoint.        Recommendations for follow up therapy are one component of a multi-disciplinary discharge planning process, led by the attending physician.  Recommendations may be updated based on patient status, additional functional criteria and insurance authorization.  Follow Up Recommendations No PT follow up    Equipment Recommendations  None recommended by PT    Recommendations for Other Services       Precautions / Restrictions Precautions Precautions: None Restrictions Weight Bearing Restrictions: No      Mobility  Bed Mobility               General bed mobility comments: Pt sitting EOB    Transfers Overall transfer level: Independent Equipment used: None                Ambulation/Gait Ambulation/Gait assistance: Modified independent (Device/Increase time) Gait Distance (Feet): 325 Feet Assistive device: None Gait Pattern/deviations: Step-through pattern Gait velocity: adequate Gait velocity interpretation: 1.31 - 2.62 ft/sec, indicative of limited community ambulator General Gait Details: Gait without loss of balance. Dyspnea 2/4 but no rest break needed  Stairs Stairs: Yes Stairs assistance: Modified independent (Device/Increase time) Stair Management: One rail Right;Forwards;Backwards;Step to  pattern Number of Stairs: 4 (1 x 4) General stair comments: Used portable step with countertop to simulate rail.  Wheelchair Mobility    Modified Rankin (Stroke Patients Only)       Balance Overall balance assessment: Mild deficits observed, not formally tested                                           Pertinent Vitals/Pain Pain Assessment: Faces Faces Pain Scale: Hurts little more Pain Location: chronic back pain Pain Descriptors / Indicators: Discomfort Pain Intervention(s): Limited activity within patient's tolerance    Home Living Family/patient expects to be discharged to:: Private residence Living Arrangements: Alone   Type of Home: House Home Access: Stairs to enter   [employer] of Steps: 3 Home Layout: One level Home Equipment: Environmental consultant - 2 wheels;Shower seat;Grab bars - tub/shower;Grab bars - toilet      Prior Function Level of Independence: Independent         Comments: drive, works part time Designer, jewellery        Extremity/Trunk Assessment   Upper Extremity Assessment Upper Extremity Assessment: Defer to OT evaluation    Lower Extremity Assessment Lower Extremity Assessment: Overall WFL for tasks assessed    Cervical / Trunk Assessment Cervical / Trunk Assessment: Normal  Communication   Communication: No difficulties  Cognition Arousal/Alertness: Awake/alert Behavior During Therapy: WFL for tasks assessed/performed Overall Cognitive Status: Within Functional Limits for tasks assessed  General Comments General comments (skin integrity, edema, etc.): reviewed energy conservation techniques and discussed use of [place] in shower.  Patient reports having exercises bands and knowing which exercises to complete- declines OP OT.    Exercises     Assessment/Plan    PT Assessment Patent does not need any further PT services  PT Problem List          PT Treatment Interventions      PT Goals (Current goals can be found in the Care Plan section)  Acute Rehab PT Goals Patient Stated Goal: home PT Goal Formulation: All assessment and education complete, DC therapy    Frequency     Barriers to discharge        Co-evaluation               AM-PAC PT "6 Clicks" Mobility  Outcome Measure Help needed turning from your back to your side while in a flat bed without using bedrails?: None Help needed moving from lying on your back to sitting on the side of a flat bed without using bedrails?: None Help needed moving to and from a bed to a chair (including a wheelchair)?: None Help needed standing up from a chair using your arms (e.g., wheelchair or bedside chair)?: None Help needed to walk in hospital room?: None Help needed climbing 3-5 steps with a railing? : None 6 Click Score: 24    End of Session   Activity Tolerance: Patient tolerated treatment well Patient left: in bed;with call bell/phone within reach (sitting EOB)   PT Visit Diagnosis: Other abnormalities of gait and mobility (R26.89)    Time: 9390-3009 PT Time Calculation (min) (ACUTE ONLY): 15 min   Charges:   PT Evaluation $PT Eval Low Complexity: 1 Low          [place] Pager [phone] Office [place] 01/26/2021, 10:35 AM

## 2021-01-26 NOTE — Evaluation (Signed)
Occupational Therapy Evaluation and Discharge  Patient Details Name: [name] MRN: [id number] DOB: 1950/12/19 Today's Date: 01/26/2021   History of Present Illness Pt is a 70 y/o male admitted 10/18 for shortness of breath. CTA negative for PE, admitted for R lower lobe PNA with effusion. PMH includes: COPD, chronic anxiety/depression, anxiety, arthritis,neuromuscular disorder, recent prolonged hospital stay after trauma complicated by multiple rib fractures with pneumothorax requiring temporary chest tube, subsequent readmission for MRSA pneumonia, bacteremia.   Clinical Impression   PTA patient independent and driving. Admitted for above and presenting near baseline modified independent level for ADLs, mobility in room.  Educated on energy conservation techniques, as pt fatigues easily and requires increased rest breaks.  Pt agreeable to use [place] in shower as needed. Verbally reviewed theraband exercises with pt, pt declining OP OT at this time.  No further OT needs identified and OT will sign off.     Recommendations for follow up therapy are one component of a multi-disciplinary discharge planning process, led by the attending physician.  Recommendations may be updated based on patient status, additional functional criteria and insurance authorization.   Follow Up Recommendations  No OT follow up    Equipment Recommendations  None recommended by OT    Recommendations for Other Services       Precautions / Restrictions Precautions Precautions: None Restrictions Weight Bearing Restrictions: No      Mobility Bed Mobility               General bed mobility comments: EOB upon entry and exit    Transfers Overall transfer level: Independent                    Balance Overall balance assessment: Mild deficits observed, not formally tested                                         ADL either performed or assessed with clinical judgement   ADL  Overall ADL's : Modified independent;At baseline                                       General ADL Comments: demonstrating modified independence for ADLs, at baseline     Vision Baseline Vision/History: 1 Wears glasses Ability to See in Adequate Light: 0 Adequate Patient Visual Report: No change from baseline (reading glasses)       Perception     Praxis      Pertinent Vitals/Pain Pain Assessment: Faces Faces Pain Scale: Hurts little more Pain Location: chronic back pain Pain Descriptors / Indicators: Discomfort Pain Intervention(s): Monitored during session     Hand Dominance     Extremity/Trunk Assessment Upper Extremity Assessment Upper Extremity Assessment: Overall WFL for tasks assessed   Lower Extremity Assessment Lower Extremity Assessment: Defer to PT evaluation   Cervical / Trunk Assessment Cervical / Trunk Assessment: Normal   Communication Communication Communication: No difficulties   Cognition Arousal/Alertness: Awake/alert Behavior During Therapy: WFL for tasks assessed/performed Overall Cognitive Status: Within Functional Limits for tasks assessed                                     General Comments  reviewed energy conservation techniques and discussed  use of [place] in shower.  Patient reports having exercises bands and knowing which exercises to complete- declines OP OT.    Exercises     Shoulder Instructions      Home Living Family/patient expects to be discharged to:: Private residence Living Arrangements: Alone   Type of Home: House Home Access: Stairs to enter [employer] of Steps: 3   Home Layout: One level     Bathroom Shower/Tub: Occupational psychologist: Handicapped height     Home Equipment: Environmental consultant - 2 wheels;Shower seat;Grab bars - tub/shower;Grab bars - toilet          Prior Functioning/Environment Level of Independence: Independent        Comments: drive, works  part time mowing yards        OT Problem List: Decreased strength;Decreased activity tolerance      OT Treatment/Interventions:      OT Goals(Current goals can be found in the care plan section) Acute Rehab OT Goals Patient Stated Goal: home OT Goal Formulation: With patient  OT Frequency:     Barriers to D/C:            Co-evaluation              AM-PAC OT "6 Clicks" Daily Activity     Outcome Measure Help from another person eating meals?: None Help from another person taking care of personal grooming?: None Help from another person toileting, which includes using toliet, bedpan, or urinal?: None Help from another person bathing (including washing, rinsing, drying)?: None Help from another person to put on and taking off regular upper body clothing?: None Help from another person to put on and taking off regular lower body clothing?: None 6 Click Score: 24   End of Session    Activity Tolerance: Patient tolerated treatment well Patient left: Other (comment);with call bell/phone within reach (sitting EOB)  OT Visit Diagnosis: Muscle weakness (generalized) (M62.81)                Time: 1025-8527 OT Time Calculation (min): 20 min Charges:  OT General Charges $OT Visit: 1 Visit OT Evaluation $OT Eval Low Complexity: 1 Low  Jolaine Artist, OT Acute Rehabilitation Services Pager [phone] Office [phone]  Delight Stare 01/26/2021, 9:35 AM

## 2021-01-26 NOTE — Discharge Summary (Signed)
Discharge Summary  [name]:[id number] DOB: 1950-08-13  PCP: Maryella Shivers, MD  Admit date: 01/21/2021 Discharge date: 01/26/2021  Time spent: 35 minutes.  Recommendations for Outpatient Follow-up:  Follow-up with pulmonary Follow-up with GI Follow-up with your primary care provider  Discharge Diagnoses:  Active Hospital Problems   Diagnosis Date Noted   Right lower lobe pneumonia 01/21/2021   Protein-calorie malnutrition, severe 01/25/2021   RLL pneumonia 01/23/2021   Depression with anxiety 01/21/2021   COPD (chronic obstructive pulmonary disease) ([place]) 03/30/2007    Resolved Hospital Problems  No resolved problems to display.    Discharge Condition: Stable  Diet recommendation: Resume previous diet.  Vitals:   01/26/21 0400 01/26/21 1100  BP: (!) 127/56 108/65  Pulse: 65 71  Resp: 20 16  Temp: 97.9 F (36.6 C) 98.2 F (36.8 C)  SpO2:  100%    History of present illness:  70 year old with past medical history significant for COPD, chronic anxiety/depression, tobacco use disorder, adenomatous polyps, recent prolonged hospital stay after trauma complicated by multiple rib fractures with pneumothorax requiring temporary chest tube, subsequent readmission for MRSA pneumonia, bacteremia, who presents to [facility] ED for further evaluation of shortness of breath. CTA negative for PE, but showing persistent consolidation in the right lower lobe with a small effusion, seen on previous imaging.  Stable 12 mm nodule in the superior segment of the left lower lobe.  Admitted for right lower lobe consolidation/pneumonia with effusion in the setting of recent MRSA Pneumonia bacteremia.  Blood cultures and sputum culture obtained during this admission were unrevealing.  Iron studies revealed iron deficiency for which GI was consulted.  Seen by GI, recommended to follow-up outpatient with GI [place], Dr. Fuller Plan in 8 to 12 weeks for possible EGD and colonoscopy.   01/26/2021:  No acute events overnight.  No new complaints.  Feels better today.  Vital signs stable temperature 98.2, BP 108/65, heart rate 71, respiratory 16, O2 saturation 100% on room air.  Advised to follow-up with pulmonary and GI post hospitalization.  Patient understands and agrees with the plan.  Hospital Course:  Principal Problem:   Right lower lobe pneumonia Active Problems:   COPD (chronic obstructive pulmonary disease) (H[facility])   Depression with anxiety   RLL pneumonia   Protein-calorie malnutrition, severe  Right lower lobe consolidation/pneumonia with effusion in the setting of recent history of MRSA pneumonia bacteremia Completed 5 days of IV vancomycin and cefepime. Cefepime stopped on 01/25/2021 and switched to Ancef. Sputum culture, reincubated for better growth. Blood cultures negative to date. Pet scan 9/22: (care everywhere) LEFT pulmonary nodule with uptake less than cardiac blood pool on  the current study. Findings suggest indolent process. Indolent  bronchogenic neoplasm remains a differential consideration. RIGHT lower lobe pneumonia. Material in RIGHT lower lobe bronchi  suggest aspiration, associated with small to moderate RIGHT-sided  pleural effusion.  Patient advised to follow-up with pulmonary posthospitalization.   Iron deficiency anemia Hemoglobin 10.6 Iron 24, trans sat 9 Ferrous sulfate 325 mg daily. Follow-up with GI and your PCP.   Severe protein calorie malnutrition BMI 13 Severe muscle mass loss. Seen by dietitian. Continue oral supplement. Continue to increase oral protein calorie intake.   Hypoalbuminemia in the setting of malnutrition Albumin level 2.7 Increase oral protein calorie intake.   Chronic anxiety/depression Stable, no suicidal ideation. Seen by psychiatry. Psychiatry recommended to continue home Celexa and home gabapentin   COPD without oxygen treatment.:  Continue home regimen Recommend complete tobacco cessation.   Left  lower lobe  pulmonary nodule:  Follow-up with pulmonary outpatient.   Tobacco use disorder: Tobacco cessation counseling provided.   History of adenomatous polyps, previously seen by Dr. Fuller Plan Follow-up with Dr. Fuller Plan for possible colonoscopy.       Estimated body mass index is 12.83 kg/m as calculated from the following:   Height as of this encounter: 6\' 1"  (1.854 m).   Weight as of this encounter: 44.1 kg.      Code Status: Full code      Consultants:  Psychiatry GI Dietitian   Procedures:  None.   Antimicrobials:  IV vancomycin, 01/22/2021. Cefepime, 01/22/2021>> 01/25/2021. Cefazolin 01/25/2021>> 01/26/2021.    Discharge Exam: BP 108/65 (BP Location: Left Arm)   Pulse 71   Temp 98.2 F (36.8 C) (Oral)   Resp 16   Ht 6\' 1"  (1.854 m)   Wt 44.3 kg   SpO2 100%   BMI 12.89 kg/m  General: 70 y.o. year-old male Frail appearing in no acute distress.  Alert and oriented x3. Cardiovascular: Regular rate and rhythm with no rubs or gallops.  No thyromegaly or JVD noted.   Respiratory: Clear to auscultation with no wheezes or rales. Good inspiratory effort. Abdomen: Soft nontender nondistended with normal bowel sounds x4 quadrants. Musculoskeletal: No lower extremity edema. 2/4 pulses in all 4 extremities. Skin: No ulcerative lesions noted or rashes Psychiatry: Mood is appropriate for condition and setting  Discharge Instructions You were cared for by a hospitalist during your hospital stay. If you have any questions about your discharge medications or the care you received while you were in the hospital after you are discharged, you can call the unit and asked to speak with the hospitalist on call if the hospitalist that took care of you is not available. Once you are discharged, your primary care physician will handle any further medical issues. Please note that NO REFILLS for any discharge medications will be authorized once you are discharged, as it is imperative that you  return to your primary care physician (or establish a relationship with a primary care physician if you do not have one) for your aftercare needs so that they can reassess your need for medications and monitor your lab values.   Allergies as of 01/26/2021       Reactions   Erythromycin Base Other (See Comments)   hallucinations   Mirtazapine Other (See Comments)   Nsaids Other (See Comments)        Medication List     STOP taking these medications    buPROPion 150 MG 24 hr tablet Commonly known as: WELLBUTRIN XL   busPIRone 5 MG tablet Commonly known as: BUSPAR   clonazePAM 1 MG tablet Commonly known as: KLONOPIN       TAKE these medications    Centrum Silver 50+Men Tabs Take 1 tablet by mouth daily.   citalopram 20 MG tablet Commonly known as: CELEXA Take 20 mg by mouth daily.   DRY EYES OP Place 2 drops into both eyes 2 (two) times daily as needed (dry eyes).   ferrous sulfate 325 (65 FE) MG tablet Take 1 tablet (325 mg total) by mouth daily with breakfast.   fluticasone-salmeterol 500-50 MCG/ACT Aepb Commonly known as: ADVAIR Inhale 1 puff into the lungs in the morning and at bedtime.   gabapentin 300 MG capsule Commonly known as: NEURONTIN Take 300 mg by mouth 3 (three) times daily.   ipratropium-albuterol 0.5-2.5 (3) MG/3ML Soln Commonly known as: DUONEB Take 3 mLs  by nebulization 4 (four) times daily.   lactose free nutrition Liqd Take 237 mLs by mouth 3 (three) times daily between meals.   magic mouthwash w/lidocaine Soln Take 5 mLs by mouth 4 (four) times daily. Swish and swallow   mupirocin ointment 2 % Commonly known as: BACTROBAN Place 1 application into the nose 2 (two) times daily.   nicotine 21 mg/24hr patch Commonly known as: NICODERM CQ - dosed in mg/24 hours Place 21 mg onto the skin every morning.   polyethylene glycol 17 g packet Commonly known as: MIRALAX / GLYCOLAX Take 17 g by mouth daily as needed for mild  constipation.   promethazine 25 MG tablet Commonly known as: PHENERGAN Take 25 mg by mouth every 8 (eight) hours as needed for nausea or vomiting.   traMADol 50 MG tablet Commonly known as: ULTRAM Take 100 mg by mouth 3 (three) times daily.   traZODone 50 MG tablet Commonly known as: DESYREL Take 25-50 mg by mouth at bedtime as needed for sleep.       Allergies  Allergen Reactions   Erythromycin Base Other (See Comments)    hallucinations   Mirtazapine Other (See Comments)   Nsaids Other (See Comments)    Follow-up Information     Maryella Shivers, MD. Call in 1 day(s).   Specialty: Family Medicine Why: Please call for a posthospital follow-up appointment. Contact information: [address] [place] [address] [phone]         Collene Gobble, MD. Call in 1 day(s).   Specialty: Pulmonary Disease Why: Please call for a posthospital follow-up appointment. Contact information: [place]  [address] [phone]                  The results of significant diagnostics from this hospitalization (including imaging, microbiology, ancillary and laboratory) are listed below for reference.    Significant Diagnostic Studies: DG Chest 2 View  Result Date: 01/21/2021 CLINICAL DATA:  Chest pain, difficulty breathing EXAM: CHEST - 2 VIEW COMPARISON:  12/19/2020 chest x-ray and chest CT. FINDINGS: Severe emphysema. Areas of scarring in the upper lobes. Nodular density in the left upper lung, shown on prior CT to be in the superior segment of the left lower lobe, likely unchanged. Continued consolidation in the right lower lobe with small right pleural effusion. Heart is normal size. No acute bony abnormality. IMPRESSION: Severe COPD. Continued consolidation in the right lower lobe with right effusion, concerning for pneumonia. Bilateral upper lobe scarring. Nodular density in the left upper lung as seen on prior CT and  likely stable. Recommend continued follow-up as recommended on prior chest CT. Electronically Signed   By: Rolm Baptise M.D.   On: 01/21/2021 17:03   CT Angio Chest PE W and/or Wo Contrast  Result Date: 01/21/2021 CLINICAL DATA:  Left chest tightness EXAM: CT ANGIOGRAPHY CHEST WITH CONTRAST TECHNIQUE: Multidetector CT imaging of the chest was performed using the standard protocol during bolus administration of intravenous contrast. Multiplanar CT image reconstructions and MIPs were obtained to evaluate the vascular anatomy. CONTRAST:  28mL OMNIPAQUE IOHEXOL 350 MG/ML SOLN COMPARISON:  12/19/2020 FINDINGS: Cardiovascular: No filling defects in the pulmonary arteries to suggest pulmonary emboli. Heart is normal size. Aorta is normal caliber. Scattered aortic calcifications. Mediastinum/Nodes: No mediastinal, hilar, or axillary adenopathy. Trachea and esophagus are unremarkable. Thyroid unremarkable. Lungs/Pleura: Severe emphysema. Biapical scarring. 12 mm nodule in the superior segment of the left lower lobe is stable. Small  right pleural effusion again noted with consolidation in the right lower lobe, stable since prior study and concerning for pneumonia. The UB Upper Abdomen: Imaging into the upper abdomen demonstrates no acute findings. Musculoskeletal: Chest wall soft tissues are unremarkable. No acute bony abnormality. Review of the MIP images confirms the above findings. IMPRESSION: No evidence of pulmonary embolus. Severe emphysema and scarring, stable. Stable 12 mm nodule in the superior segment of the left lower lobe. This could be further evaluated with nuclear medicine PET CT. Continued consolidation in the right lower lobe with small right effusion. Findings concerning for pneumonia. Aortic Atherosclerosis (ICD10-I70.0) and Emphysema (ICD10-J43.9). Electronically Signed   By: Rolm Baptise M.D.   On: 01/21/2021 20:45    Microbiology: Recent Results (from the past 240 hour(s))  Blood culture  (routine x 2)     Status: None   Collection Time: 01/21/21  7:59 PM   Specimen: BLOOD  Result Value Ref Range Status   Specimen Description BLOOD LEFT ARM  Final   Special Requests   Final    BOTTLES DRAWN AEROBIC AND ANAEROBIC Blood Culture adequate volume   Culture   Final    NO GROWTH 5 DAYS Performed at [facility], [address], [place], [place] [address]    Report Status 01/26/2021 FINAL  Final  Resp Panel by RT-PCR (Flu A&B, Covid) Nasopharyngeal Swab     Status: None   Collection Time: 01/21/21  8:55 PM   Specimen: Nasopharyngeal Swab; Nasopharyngeal(NP) swabs in vial transport medium  Result Value Ref Range Status   SARS Coronavirus 2 by RT PCR NEGATIVE NEGATIVE Final    Comment: (NOTE) SARS-CoV-2 target nucleic acids are NOT DETECTED.  The SARS-CoV-2 RNA is generally detectable in upper respiratory specimens during the acute phase of infection. The lowest concentration of SARS-CoV-2 viral copies this assay can detect is 138 copies/mL. A negative result does not preclude SARS-Cov-2 infection and should not be used as the sole basis for treatment or other patient management decisions. A negative result may occur with  improper specimen collection/handling, submission of specimen other than nasopharyngeal swab, presence of viral mutation(s) within the areas targeted by this assay, and inadequate number of viral copies(<138 copies/mL). A negative result must be combined with clinical observations, patient history, and epidemiological information. The expected result is Negative.  Fact Sheet for Patients:  EntrepreneurPulse.com.au  Fact Sheet for Healthcare Providers:  IncredibleEmployment.be  This test is no t yet approved or cleared by the [place] FDA and  has been authorized for detection and/or diagnosis of SARS-CoV-2 by FDA under an Emergency Use Authorization (EUA). This EUA will remain  in effect (meaning this  test can be used) for the duration of the COVID-19 declaration under Section 564(b)(1) of the Act, 21 U.S.C.section 360bbb-3(b)(1), unless the authorization is terminated  or revoked sooner.       Influenza A by PCR NEGATIVE NEGATIVE Final   Influenza B by PCR NEGATIVE NEGATIVE Final    Comment: (NOTE) The Xpert Xpress SARS-CoV-2/FLU/RSV plus assay is intended as an aid in the diagnosis of influenza from Nasopharyngeal swab specimens and should not be used as a sole basis for treatment. Nasal washings and aspirates are unacceptable for Xpert Xpress SARS-CoV-2/FLU/RSV testing.  Fact Sheet for Patients: EntrepreneurPulse.com.au  Fact Sheet for Healthcare Providers: IncredibleEmployment.be  This test is not yet approved or cleared by the [place] FDA and has been authorized for detection and/or diagnosis of SARS-CoV-2 by FDA under an Emergency Use  Authorization (EUA). This EUA will remain in effect (meaning this test can be used) for the duration of the COVID-19 declaration under Section 564(b)(1) of the Act, 21 U.S.C. section 360bbb-3(b)(1), unless the authorization is terminated or revoked.  Performed at [facility], [place] [address] [place], [place] [address]   Blood culture (routine x 2)     Status: None (Preliminary result)   Collection Time: 01/21/21 11:03 PM   Specimen: BLOOD  Result Value Ref Range Status   Specimen Description BLOOD RIGHT ANTECUBITAL  Final   Special Requests   Final    BOTTLES DRAWN AEROBIC AND ANAEROBIC Blood Culture results may not be optimal due to an excessive volume of blood received in culture bottles   Culture   Final    NO GROWTH 4 DAYS Performed at [facility], [place] [address], [place], [place] [address]    Report Status PENDING  Incomplete  MRSA Next Gen by PCR, Nasal     Status: Abnormal   Collection Time: 01/22/21  9:55 AM   Specimen: Nasal Mucosa; Nasal Swab  Result Value Ref Range  Status   MRSA by PCR Next Gen DETECTED (A) NOT DETECTED Final    Comment: RESULT CALLED TO, READ BACK BY AND VERIFIED WITH: RN J BONEY [id number] AT 1131 BY CM (NOTE) The GeneXpert MRSA Assay (FDA approved for NASAL specimens only), is one component of a comprehensive MRSA colonization surveillance program. It is not intended to diagnose MRSA infection nor to guide or monitor treatment for MRSA infections. Test performance is not FDA approved in patients less than 110 years old. Performed at [facility], [place] [address] [place], [place] [address]   Expectorated Sputum Assessment w Gram Stain, Rflx to Resp Cult     Status: None   Collection Time: 01/23/21 11:13 AM   Specimen: Expectorated Sputum  Result Value Ref Range Status   Specimen Description EXPECTORATED SPUTUM  Final   Special Requests NONE  Final   Sputum evaluation   Final    THIS SPECIMEN IS ACCEPTABLE FOR SPUTUM CULTURE Performed at [facility], [place] [address], [place], [place] [address]    Report Status 01/23/2021 FINAL  Final  Culture, Respiratory w Gram Stain     Status: None   Collection Time: 01/23/21 11:13 AM  Result Value Ref Range Status   Specimen Description EXPECTORATED SPUTUM  Final   Special Requests NONE Reflexed from [id number]  Final   Gram Stain   Final    ABUNDANT WBC PRESENT,BOTH PMN AND MONONUCLEAR FEW GRAM POSITIVE COCCI FEW GRAM NEGATIVE RODS    Culture   Final    RARE STAPHYLOCOCCUS AUREUS SUSCEPTIBILITIES TO FOLLOW Performed at [facility], [place] [address] [place],  [address]    Report Status 01/26/2021 FINAL  Final     Labs: Basic Metabolic Panel: Recent Labs  Lab 01/21/21 1632 01/22/21 0604 01/23/21 0303 01/24/21 0309 01/25/21 0547  NA 138 134* 135 136 138  K 3.9 3.0* 3.8 4.1 3.9  CL 101 103 105 102 103  CO2 27 22 23 26 28   GLUCOSE 93 109* 98 90 93  BUN 22 18 20 21 19   CREATININE 0.72 0.59* 0.65 0.61 0.53*  CALCIUM 9.6 8.3* 8.5* 8.7* 8.9  MG  --  1.9   --  1.9  --   PHOS  --  3.5  --  3.8  --    Liver Function Tests: Recent Labs  Lab 01/21/21 1632 01/24/21 0309  AST 26 20  ALT 21 16  ALKPHOS 86 70  BILITOT 0.6 0.4  PROT 7.6 5.9*  ALBUMIN 3.4* 2.7*   No results for input(s): LIPASE, AMYLASE in the last 168 hours. No results for input(s): AMMONIA in the last 168 hours. CBC: Recent Labs  Lab 01/21/21 1632 01/22/21 0604 01/23/21 0303 01/24/21 0309 01/25/21 0547  WBC 9.0 5.4 6.1 6.2 5.8  NEUTROABS 5.6  --   --   --   --   HGB 13.8 11.6* 10.8* 10.6* 11.3*  HCT 42.1 35.1* 33.1* 32.9* 34.4*  MCV 92.5 93.1 93.8 94.5 93.5  PLT 426* 219 296 265 272   Cardiac Enzymes: No results for input(s): CKTOTAL, CKMB, CKMBINDEX, TROPONINI in the last 168 hours. BNP: BNP (last 3 results) No results for input(s): BNP in the last 8760 hours.  ProBNP (last 3 results) No results for input(s): PROBNP in the last 8760 hours.  CBG: No results for input(s): GLUCAP in the last 168 hours.     Signed:  Kayleen Memos, MD [facility] 01/26/2021, 4:32 PM

## 2021-01-26 NOTE — Care Management Important Message (Signed)
Important Message  Patient Details  Name: [name] MRN: [id number] Date of Birth: 1950-05-21   Medicare Important Message Given:  Yes     Zuhair Lariccia 01/26/2021, 3:00 PM

## 2021-01-26 NOTE — Care Management Important Message (Signed)
Important Message  Patient Details  Name: [name] MRN: [id number] Date of Birth: 11/22/1950   Medicare Important Message Given:  Yes  Patient left prior to IM delivery IM mailed to the patient home address.    [name] 01/26/2021, 3:11 PM

## 2021-01-26 NOTE — Progress Notes (Signed)
AVS provided and reviewed with pt. All questions answered at this time. Pt. Discharged  home via car with friend.

## 2021-01-27 ENCOUNTER — Ambulatory Visit (HOSPITAL_COMMUNITY)
Admission: EM | Admit: 2021-01-27 | Discharge: 2021-01-27 | Disposition: A | Discharge status: Home / Self Care | Payer: Medicare Other | Attending: Psychiatry | Admitting: Psychiatry

## 2021-01-27 DIAGNOSIS — F418 Other specified anxiety disorders: Secondary | ICD-10-CM

## 2021-01-27 DIAGNOSIS — F330 Major depressive disorder, recurrent, mild: Secondary | ICD-10-CM | POA: Diagnosis present

## 2021-01-27 DIAGNOSIS — F17210 Nicotine dependence, cigarettes, uncomplicated: Secondary | ICD-10-CM | POA: Insufficient documentation

## 2021-01-27 LAB — CULTURE, BLOOD (ROUTINE X 2): Culture: NO GROWTH

## 2021-01-27 NOTE — Discharge Instructions (Addendum)
Take all medications as prescribed. Keep all follow-up appointments as scheduled.  Do not consume alcohol or use illegal drugs while on prescription medications. Report any adverse effects from your medications to your primary care provider promptly.  In the event of recurrent symptoms or worsening symptoms, call 911, a crisis hotline, or go to the nearest emergency department for evaluation.     YOU ARE ENCOURAGED TO FOLLOW UP WITH OUTPATIENT TREATMENT:  [facility] Get online care: [email or web address] Address: [address] [place], [place], [place] [address]  Phone: [phone]  [facility] Address: [address], [place], [place] [address] Phone: [phone]

## 2021-01-27 NOTE — ED Provider Notes (Signed)
Behavioral Health Urgent Care Medical Screening Exam  Patient Name: [name] MRN: [id number] Date of Evaluation: 01/27/21 Chief Complaint:   Diagnosis:  Final diagnoses:  Mild episode of recurrent major depressive disorder ([place])    Stephania Fragmin, 70 y.o., male patient seen face to face by this provider and chart reviewed on 01/27/21.  On evaluation [name]   History of Present illness: [name] is a 70 y.o. male presents voluntary accompanied by a friend due to worsening depression.  Reports he was recently discharged from the hospital due to pneumonia.  Reports this is his fourth hospitalization for pneumonia.  States he recently got into a verbal altercation between he and his daughter because of his ongoing smoking habit and multiple hospitalizations.  [name] reports that he has cut back from 4 packs daily to 1-1/2 pack's. " Things were said and we haven't spoken since Sunday."  [name] stated that once he was discharged he realized that his daughter has been his only support.  States he has a son however, he reported that his son was recently discharged due to a motor vehicle accident so he did not want to be a bother to his sons  family.    [name] reports "I am just afraid to go home because my depression is really bad and I feel weak"  He denied suicidal or homicidal ideations.  Denies auditory or visual hallucinations.  Denied previous suicide attempts.  States past hospitalization at [facility] for depression but that was many years ago"   Denied that he is currently followed by therapy or psychiatry.  He reports he does not want to be on burden to his friend who he is staying with. " I was just hoping I can stay until I can get my mental health under control."  Chart reviewed patient was followed by psychiatry while hospitalized.  Medication adjustments were made.  Patient states continue medications as indicated.  Follow-up with therapy and/or psychiatry asked for  area.  Discussed following up with [facility] partial hospitalization programming and/or day program for geriatric patients.  During evaluation [name] is sitting in a wheel chair in no acute distress.  He is alert/oriented x 4; calm/cooperative; and mood congruent with affect.  He is speaking in a clear tone at moderate volume, and normal pace; with good eye contact.His thought process is coherent and relevant; There is no indication that he is currently responding to internal/external stimuli or experiencing delusional thought content; and he has denied suicidal/self-harm/homicidal ideation, psychosis, and paranoia.   Patient has remained calm throughout assessment and has answered questions appropriately.     At this time [name] is educated and verbalizes understanding of mental health resources and other crisis services in the community. He is instructed to call 911 and present to the nearest emergency room should he experience any suicidal/homicidal ideation, auditory/visual/hallucinations, or detrimental worsening of his mental health condition.  He was a also advised by Probation officer that he could call the toll-free phone on insurance card to assist with identifying in network counselors and agencies or number on back of Medicaid card to  speak with care coordinator   Psychiatric Specialty Exam  Presentation  General Appearance:Appropriate for Environment Eye Contact:Good Speech:Clear and Coherent Speech Volume:Normal Handedness:Right  Mood and Affect  Mood:Depressed Affect:Congruent  Thought Process  Thought Processes:Coherent Descriptions of Associations:Intact Orientation:Full (Time, Place and Person) Thought Content:Logical   Hallucinations:None Ideas of Reference:None Suicidal Thoughts:No Homicidal Thoughts:No  Sensorium  Memory:Immediate Fair; Recent Fair; Remote Fair Judgment:Good Insight:Good  Executive Functions  Concentration:Good Attention  Span:Fair [place] Language:Good  Psychomotor Activity  Psychomotor Activity:Normal  Assets  Assets:Housing; Desire for Improvement  Sleep  Sleep:Fair Number of hours: No data recorded  Nutritional Assessment (For OBS and FBC admissions only) Has the patient had a weight loss or gain of 10 pounds or more in the last 3 months?: No Has the patient had a decrease in food intake/or appetite?: No Does the patient have dental problems?: No Does the patient have eating habits or behaviors that may be indicators of an eating disorder including binging or inducing vomiting?: No Has the patient recently lost weight without trying?: 0 Has the patient been eating poorly because of a decreased appetite?: 0 Malnutrition Screening Tool Score: 0   Physical Exam: Physical Exam Vitals reviewed.  Cardiovascular:     Rate and Rhythm: Normal rate and regular rhythm.  Neurological:     Mental Status: He is alert and oriented to person, place, and time.  Psychiatric:        Attention and Perception: Attention and perception normal.        Mood and Affect: Mood is depressed.        Speech: Speech normal.        Behavior: Behavior normal.        Thought Content: Thought content normal. Thought content does not include suicidal ideation. Thought content does not include suicidal plan.        Cognition and Memory: Cognition and memory normal.        Judgment: Judgment normal.   Review of Systems  Eyes: Negative.   Cardiovascular: Negative.   Genitourinary: Negative.   Psychiatric/Behavioral:  Positive for depression. Negative for suicidal ideas. The patient is not nervous/anxious.   All other systems reviewed and are negative. Blood pressure 101/71, pulse 82, temperature 97.7 F (36.5 C), temperature source Oral, resp. rate 18, SpO2 100 %. There is no height or weight on file to calculate BMI.  Musculoskeletal: Strength & Muscle Tone: within normal limits Gait &  Station: normal Patient leans: N/A   [place] [facility] Discharge Disposition for Follow up and Recommendations: Based on my evaluation the patient does not appear to have an emergency medical condition and can be discharged with resources and follow up care in outpatient services for Medication Management, Individual Therapy, and Group Therapy   [place]ill [place], NP 01/27/2021, 2:42 PM

## 2021-01-27 NOTE — Progress Notes (Signed)
   01/27/21 1210  [place] (Walk-ins at [facility] only)  What Is the Reason for Your Visit/Call Today? Patient was just seen by psychiatry - Dr. Lovette Cliche while inpt on 10/20 - Per chart review, multiple recent changes to psychotropic medications - on citalopram 20 mg (LF 9/23), venlafaxine 150 mg (LF 9/8), buproprion 150 mg (LF 9/27), buspirone 5 mg TID (LF 10/11), klonopin 1 mg BID (LF 9/23) - also filled trazodone back in July.   Review of notes through hospitalization indicate pt consistently denying SI.    Patient Report:   Pt remembered this author from earlier in the week. Was grateful to have explanations of why he was on suicide precautions (and to have them discontinued). Has been having some improvement in mood - both physically feeling better and d/c buspirone which he had had poor response to. Wants to give citalopram at current dose a chance to work; plans to discuss changes with PCP. Has been thinking about comments on gun safety - planning to get a safe (or at least move guns away from easy access) due to grandchildren. Continues to deny SI, HI, AH/VH. Appetite is much better, sleeping poorly (was woken up for 2 hours last night to get IV replaced, no intrinsic insomnia). Hasn't talked to daughter but had a good conversation with son yesterday who was dc from hospital.        Patient reports he returned home and had a hard time going into the house, stating he doesn't want to be alone.  He called his friend of 30 years, who brought patient in today.  Patient is denying SI, HI and AVH and appears to be seeing inpatient admission for support.  He is informed he does not meet criteria and he will be provided with outpatient resources.  How Long Has This Been Causing You Problems? 1 wk - 1 month  Have You Recently Had Any Thoughts About Hurting Yourself? No  Are You Planning to Commit Suicide/Harm Yourself At This time? No  Have you Recently Had Thoughts About [place]? No   Are You Planning To Harm Someone At This Time? No  Are you currently experiencing any auditory, visual or other hallucinations? No  Have You Used Any Alcohol or Drugs in the Past 24 Hours? No  Do you have any current medical co-morbidities that require immediate attention? No  Clinician description of patient physical appearance/behavior: Patient appears depressed.  He doesn't want to be alone and presents with his friend.  He is calm, cooperative.  What Do You Feel Would Help You the Most Today? Treatment for Depression or other mood problem  If access to [facility] was not available, would you have sought care in the Emergency Department? No  Determination of Need Routine (7 days)  Options For Referral Outpatient Therapy

## 2021-01-29 NOTE — BH Assessment (Signed)
  Care Management - Follow Up [facility] Discharges   Writer attempted to make contact with patient today and was unsuccessful.  Writer left a HIPPA compliant voice message.    Per chart review, patient was discharged with resources and follow up care for mental health outpatient services.

## 2021-02-06 DIAGNOSIS — Z881 Allergy status to other antibiotic agents status: Secondary | ICD-10-CM | POA: Diagnosis not present

## 2021-02-06 DIAGNOSIS — J439 Emphysema, unspecified: Secondary | ICD-10-CM | POA: Diagnosis not present

## 2021-02-06 DIAGNOSIS — J90 Pleural effusion, not elsewhere classified: Secondary | ICD-10-CM | POA: Diagnosis not present

## 2021-02-06 DIAGNOSIS — Z681 Body mass index (BMI) 19 or less, adult: Secondary | ICD-10-CM | POA: Diagnosis not present

## 2021-02-06 DIAGNOSIS — J9610 Chronic respiratory failure, unspecified whether with hypoxia or hypercapnia: Secondary | ICD-10-CM | POA: Diagnosis not present

## 2021-02-06 DIAGNOSIS — R531 Weakness: Secondary | ICD-10-CM | POA: Diagnosis not present

## 2021-02-06 DIAGNOSIS — R627 Adult failure to thrive: Secondary | ICD-10-CM | POA: Diagnosis not present

## 2021-02-06 DIAGNOSIS — Z8616 Personal history of COVID-19: Secondary | ICD-10-CM | POA: Diagnosis not present

## 2021-02-06 DIAGNOSIS — R918 Other nonspecific abnormal finding of lung field: Secondary | ICD-10-CM | POA: Diagnosis not present

## 2021-02-06 DIAGNOSIS — J449 Chronic obstructive pulmonary disease, unspecified: Secondary | ICD-10-CM | POA: Diagnosis not present

## 2021-02-06 DIAGNOSIS — J9811 Atelectasis: Secondary | ICD-10-CM | POA: Diagnosis not present

## 2021-02-06 DIAGNOSIS — J440 Chronic obstructive pulmonary disease with acute lower respiratory infection: Secondary | ICD-10-CM | POA: Diagnosis not present

## 2021-02-06 DIAGNOSIS — I10 Essential (primary) hypertension: Secondary | ICD-10-CM | POA: Diagnosis not present

## 2021-02-06 DIAGNOSIS — Z720 Tobacco use: Secondary | ICD-10-CM | POA: Diagnosis not present

## 2021-02-06 DIAGNOSIS — R0789 Other chest pain: Secondary | ICD-10-CM | POA: Diagnosis not present

## 2021-02-06 DIAGNOSIS — R0902 Hypoxemia: Secondary | ICD-10-CM | POA: Diagnosis not present

## 2021-02-06 DIAGNOSIS — R6889 Other general symptoms and signs: Secondary | ICD-10-CM | POA: Diagnosis not present

## 2021-02-06 DIAGNOSIS — E43 Unspecified severe protein-calorie malnutrition: Secondary | ICD-10-CM | POA: Diagnosis not present

## 2021-02-06 DIAGNOSIS — R634 Abnormal weight loss: Secondary | ICD-10-CM | POA: Diagnosis not present

## 2021-02-06 DIAGNOSIS — Z79899 Other long term (current) drug therapy: Secondary | ICD-10-CM | POA: Diagnosis not present

## 2021-02-06 DIAGNOSIS — I252 Old myocardial infarction: Secondary | ICD-10-CM | POA: Diagnosis not present

## 2021-02-06 DIAGNOSIS — Z7952 Long term (current) use of systemic steroids: Secondary | ICD-10-CM | POA: Diagnosis not present

## 2021-02-06 DIAGNOSIS — R069 Unspecified abnormalities of breathing: Secondary | ICD-10-CM | POA: Diagnosis not present

## 2021-02-06 DIAGNOSIS — J9620 Acute and chronic respiratory failure, unspecified whether with hypoxia or hypercapnia: Secondary | ICD-10-CM | POA: Diagnosis not present

## 2021-02-06 DIAGNOSIS — Z86711 Personal history of pulmonary embolism: Secondary | ICD-10-CM | POA: Diagnosis not present

## 2021-02-06 DIAGNOSIS — F17210 Nicotine dependence, cigarettes, uncomplicated: Secondary | ICD-10-CM | POA: Diagnosis not present

## 2021-02-06 DIAGNOSIS — Z20822 Contact with and (suspected) exposure to covid-19: Secondary | ICD-10-CM | POA: Diagnosis not present

## 2021-02-06 DIAGNOSIS — R911 Solitary pulmonary nodule: Secondary | ICD-10-CM | POA: Diagnosis not present

## 2021-02-06 DIAGNOSIS — I700 Atherosclerosis of aorta: Secondary | ICD-10-CM | POA: Diagnosis not present

## 2021-02-06 DIAGNOSIS — M1990 Unspecified osteoarthritis, unspecified site: Secondary | ICD-10-CM | POA: Diagnosis not present

## 2021-02-06 DIAGNOSIS — Z743 Need for continuous supervision: Secondary | ICD-10-CM | POA: Diagnosis not present

## 2021-02-06 DIAGNOSIS — R0602 Shortness of breath: Secondary | ICD-10-CM | POA: Diagnosis not present

## 2021-02-06 DIAGNOSIS — Z792 Long term (current) use of antibiotics: Secondary | ICD-10-CM | POA: Diagnosis not present

## 2021-02-06 DIAGNOSIS — R079 Chest pain, unspecified: Secondary | ICD-10-CM | POA: Diagnosis not present

## 2021-02-06 DIAGNOSIS — J441 Chronic obstructive pulmonary disease with (acute) exacerbation: Secondary | ICD-10-CM | POA: Diagnosis not present

## 2021-02-10 DIAGNOSIS — Z79899 Other long term (current) drug therapy: Secondary | ICD-10-CM | POA: Diagnosis not present

## 2021-02-11 DIAGNOSIS — D649 Anemia, unspecified: Secondary | ICD-10-CM | POA: Diagnosis not present

## 2021-02-11 DIAGNOSIS — M545 Low back pain, unspecified: Secondary | ICD-10-CM | POA: Diagnosis not present

## 2021-02-11 DIAGNOSIS — R2681 Unsteadiness on feet: Secondary | ICD-10-CM | POA: Diagnosis not present

## 2021-02-23 DIAGNOSIS — M549 Dorsalgia, unspecified: Secondary | ICD-10-CM | POA: Diagnosis not present

## 2021-02-23 DIAGNOSIS — N3001 Acute cystitis with hematuria: Secondary | ICD-10-CM | POA: Diagnosis not present

## 2021-02-23 DIAGNOSIS — N3090 Cystitis, unspecified without hematuria: Secondary | ICD-10-CM | POA: Diagnosis not present

## 2021-02-23 DIAGNOSIS — M62830 Muscle spasm of back: Secondary | ICD-10-CM | POA: Diagnosis not present

## 2021-04-19 ENCOUNTER — Telehealth: Payer: Self-pay | Admitting: Orthopaedic Surgery

## 2021-04-19 NOTE — Telephone Encounter (Signed)
ERR

## 2021-04-25 ENCOUNTER — Encounter: Payer: Self-pay | Admitting: Orthopaedic Surgery

## 2021-04-25 ENCOUNTER — Ambulatory Visit: Payer: Self-pay

## 2021-04-25 ENCOUNTER — Ambulatory Visit: Payer: Medicare Other | Admitting: Orthopaedic Surgery

## 2021-04-25 DIAGNOSIS — M545 Low back pain, unspecified: Secondary | ICD-10-CM

## 2021-04-25 DIAGNOSIS — G8929 Other chronic pain: Secondary | ICD-10-CM | POA: Diagnosis not present

## 2021-04-25 DIAGNOSIS — M25552 Pain in left hip: Secondary | ICD-10-CM

## 2021-04-25 MED ORDER — CYCLOBENZAPRINE HCL 10 MG PO TABS: 10.0000 mg | ORAL_TABLET | Freq: Three times a day (TID) | ORAL | 0 refills | Status: DC | PRN

## 2021-04-25 NOTE — Addendum Note (Signed)
Addended by: Elvin So L on: 04/25/2021 11:07 AM   Modules accepted: Orders

## 2021-04-25 NOTE — Progress Notes (Signed)
Office Visit Note   Patient: [name]           Date of Birth: 09/05/1950           MRN: [id number] Visit Date: 04/25/2021              Requested by: Maryella Shivers, MD [place],   [id number] PCP: Maryella Shivers, MD   Assessment & Plan: Visit Diagnoses:  1. Chronic left-sided low back pain without sciatica   2. Pain in left hip     Plan: Place him on Flexeril mainly take at night.  He will continue his tramadol from his primary care physician and also his gabapentin [place] from his primary care physician.  We will work on obtaining a new MRI for epidural steroid planning and have him back once the MRI images are available.  MRI is warranted due to the fact that he has new symptoms since his fall in June.  Questions were encouraged and answered at length by Dr. Ninfa Linden myself.  Follow-Up Instructions: Return After MRI.   Orders:  Orders Placed This Encounter  Procedures   XR Lumbar Spine 2-3 Views   XR HIP UNILAT W OR W/O PELVIS 2-3 VIEWS LEFT   Meds ordered this encounter  Medications   cyclobenzaprine (FLEXERIL) 10 MG tablet    Sig: Take 1 tablet (10 mg total) by mouth 3 (three) times daily as needed for muscle spasms.    Dispense:  30 tablet    Refill:  0      Procedures: No procedures performed   Clinical Data: No additional findings.   Subjective: Chief Complaint  Patient presents with   Lower Back - Pain    HPI Mr. [name] is a pleasant 71 year old male were seen for the first time for left hip and back pain.  Has long history of low back pain with a history of surgical intervention in 2005 he states discectomy was done.  Since then he is had to have epidural steroid injections at least on 3 different occasions and states these lasted about a year.  Unfortunately he states his provider that is been giving him the epidural steroid injections have not been able to accommodate him.  He did have a fall this past June when  he was cleaning gutters and fell onto some steps.  He suffered an injury to his left arm and this required surgery also sustained multiple rib fractures and had a pneumothorax needed a chest tube placed.  A states since the fall he has had pins and needlelike sensation in his knee from his right hip down to the mid thigh.  He is having low back pain.  He states this is a change from the pain he has had in the past.  He is on gabapentin and tramadol.  He is taking Flexeril in the past but does not have any at this point time.  He is also tried over-the-counter rubs for the low back pain.  He is reports his last epidural steroid injection was about a year ago.  He denies any groin pain he has no problems with donning shoes and socks.  Does state laying on the left hip becomes uncomfortable and he has to roll over onto his right hip.   Review of Systems  Constitutional:  Negative for fever.  Musculoskeletal:  Positive for back pain.  Otherwise see HPI  Objective: Vital Signs: There were no vitals taken for this visit.  Physical Exam Constitutional:      Appearance: He is not ill-appearing or diaphoretic.  Cardiovascular:     Pulses: Normal pulses.  Pulmonary:     Effort: Pulmonary effort is normal.  Neurological:     Mental Status: He is alert and oriented to person, place, and time.    Ortho Exam Lower extremities he has 5 out of 5 strength throughout the lower extremities against resistance.  Positive straight leg raise on the left negative on the right.  Deep tendon reflexes are 2+ bilaterally at the knees and ankles.  Dorsal pedal pulses are 2+ bilaterally and equal symmetric.  Sensation grossly intact throughout both feet.  He has limited flexion and extension lumbar spine and maximal discomfort with extension of the lumbar spine.  Tenderness left and right paraspinous lower lumbar region with palpation. Specialty Comments:  No specialty comments available.  Imaging: XR HIP UNILAT W OR  W/O PELVIS 2-3 VIEWS LEFT  Result Date: 04/25/2021 AP pelvis and lateral view of both hips.  She has slight narrowing of the left hip joint compared to the right.  Otherwise no acute findings.  Both hips well located.  No bony abnormalities of either hip.  No other fractures identified throughout the pelvis.  XR Lumbar Spine 2-3 Views  Result Date: 04/25/2021 Lumbar spine 2 views: Shows significant scoliosis in the lumbar spine and degenerative changes L2 through as 1.  No acute fractures.  Arthrosclerosis aorta noted.    PMFS History: Patient Active Problem List   Diagnosis Date Noted   Protein-calorie malnutrition, severe 01/25/2021   RLL pneumonia 01/23/2021   Right lower lobe pneumonia 01/21/2021   Depression with anxiety 01/21/2021   Thrush 11/06/2020   HCAP (healthcare-associated pneumonia) 11/06/2020   Acute on chronic respiratory failure with hypoxia ([place]) 11/05/2020   Anemia of chronic disease 11/05/2020   Closed fracture of multiple ribs of left side with routine healing 10/16/2020   Laceration of arm, left, complicated, subsequent encounter 10/16/2020   Traumatic pneumothorax and hemothorax, subsequent encounter 10/16/2020   Abnormal gait 05/19/2020   Anxiety 05/19/2020   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   Chronic fatigue syndrome 05/19/2020   Chronic pain syndrome 05/19/2020   Dilatation of aorta ([place]) 05/19/2020   Frail elderly 05/19/2020   Hypothyroidism 05/19/2020   Insomnia 05/19/2020   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   Malnutrition of moderate degree ([place]: 60% to less than 75% of standard weight) ([place]) 05/19/2020   Mixed hyperlipidemia 05/19/2020   Prediabetes 05/19/2020   Protein calorie malnutrition ([place]) 05/19/2020   Recurrent major depression in remission ([place]) 05/19/2020   Tobacco dependence 05/19/2020   Vitamin B12 deficiency 05/19/2020   Sensory disturbance 04/13/2014   Epigastric pain    Orthostasis    COPD exacerbation ([place]) 04/12/2014   Chest pain  04/12/2014   Numbness and tingling 04/12/2014   CHEST PAIN 06/27/2008   ABNORMAL FINDINGS GI TRACT 06/27/2008   COLONIC POLYPS, HX OF 06/27/2008   WEIGHT LOSS 05/23/2008   EPIGASTRIC PAIN 05/23/2008   NONSPEC ABN FINDNG RAD & OTH EXAM ABDOMINAL AREA 05/23/2008   TOBACCO ABUSE 03/30/2007   EMPHYSEMA 03/30/2007   ASTHMA 03/30/2007   COPD (chronic obstructive pulmonary disease) ([place]) 03/30/2007   OSTEOARTHRITIS 03/30/2007   ARTHRITIS 03/30/2007   DEGENERATIVE [place] DISEASE, 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   Past Medical History:  Diagnosis Date   Anxiety    Arthritis    Cataract    COPD (chronic obstructive pulmonary disease) ([place])    inhaler   Depression  Neuromuscular disorder ([place])    bulding disc    Family History  Problem Relation Age of Onset   Diabetes Father    Colon polyps Father    Heart disease Father    Stroke Father    Heart failure Father    Diabetes Brother    Muscular dystrophy Mother    Diabetes Paternal Uncle    Colon cancer Paternal Grandfather    Diabetes Son     Past Surgical History:  Procedure Laterality Date   BACK SURGERY     LUMBAR [place] SURGERY Left 06/2003   L4 on L3 hemilaminectomy, foraminotomy for 3-4,4-5 with left L3-L4 extraforaminal diskectomy/notes 08/22/2010    REPAIR DURAL / CSF LEAK  07/2003   Archie Endo 08/22/2010   Social History   Occupational History   Not on file  Tobacco Use   Smoking status: Every Day    Packs/day: 1.50    Years: 43.00    Pack years: 64.50    Types: Cigarettes   Smokeless tobacco: Never   Tobacco comments:    tried chantix couldn't take it  Vaping Use   Vaping Use: Never used  Substance and Sexual Activity   Alcohol use: Not Currently    Alcohol/week: 6.0 standard drinks    Types: 6 Cans of beer per week    Comment: 2 times a week   Drug use: No   Sexual activity: Not Currently

## 2021-05-04 ENCOUNTER — Ambulatory Visit
Admission: RE | Admit: 2021-05-04 | Discharge: 2021-05-04 | Disposition: A | Discharge status: Home / Self Care | Payer: Medicare Other | Source: Ambulatory Visit | Attending: Physician Assistant | Admitting: Physician Assistant

## 2021-05-04 ENCOUNTER — Other Ambulatory Visit: Payer: Self-pay

## 2021-05-04 DIAGNOSIS — M545 Low back pain, unspecified: Secondary | ICD-10-CM

## 2021-05-09 ENCOUNTER — Ambulatory Visit (INDEPENDENT_AMBULATORY_CARE_PROVIDER_SITE_OTHER): Payer: Medicare Other | Admitting: Orthopaedic Surgery

## 2021-05-09 ENCOUNTER — Other Ambulatory Visit: Payer: Self-pay

## 2021-05-09 ENCOUNTER — Encounter: Payer: Self-pay | Admitting: Orthopaedic Surgery

## 2021-05-09 DIAGNOSIS — G8929 Other chronic pain: Secondary | ICD-10-CM | POA: Diagnosis not present

## 2021-05-09 DIAGNOSIS — M48061 Spinal stenosis, lumbar region without neurogenic claudication: Secondary | ICD-10-CM

## 2021-05-09 DIAGNOSIS — M545 Low back pain, unspecified: Secondary | ICD-10-CM

## 2021-05-09 MED ORDER — ACETAMINOPHEN-CODEINE #3 300-30 MG PO TABS: 1.0000 | ORAL_TABLET | Freq: Three times a day (TID) | ORAL | 0 refills | Status: DC | PRN

## 2021-05-09 NOTE — Progress Notes (Signed)
The patient comes in today to go over MRI of his lumbar spine.  This was compared to MRI from 2019.  He is 71 years old.  He has a remote history of spine surgery in the lumbar spine by Dr. Joya Salm who is since retired.  He says his quality of life is really gotten bad in terms of pain in his low back that radiates down both legs.  It was first in to the left side and left hip only saw him.  His hip exam is been normal and his hip x-rays did not show severe arthritis.  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  There is to the left at 1 side and the right at another level.  L4-L5 and L5-S1 seem to have bilaterally and L5-S1 it is quite severe.  I would like to send him to Dr. Ernestina Patches for bilateral L5-S1 ESI's and then have Dr. Ernestina Patches consider other injections depending on the success of injections.  The patient has reported success in the past from epidurals.  Another option would be considering surgical referral.  He would like to try injections first.  We will see if we get this set up and I will send in some Tylenol 3 for him in the interim.

## 2021-05-14 ENCOUNTER — Telehealth: Payer: Self-pay | Admitting: Physical Medicine and Rehabilitation

## 2021-05-14 NOTE — Telephone Encounter (Signed)
Patient called. He would like someone to call him and discuss the injection. His call back number is [phone]

## 2021-05-23 ENCOUNTER — Ambulatory Visit: Payer: Medicare Other | Admitting: Physical Medicine and Rehabilitation

## 2021-05-24 ENCOUNTER — Ambulatory Visit (INDEPENDENT_AMBULATORY_CARE_PROVIDER_SITE_OTHER): Payer: Medicare Other | Admitting: Physical Medicine and Rehabilitation

## 2021-05-24 ENCOUNTER — Other Ambulatory Visit: Payer: Self-pay

## 2021-05-24 ENCOUNTER — Ambulatory Visit: Payer: Self-pay

## 2021-05-24 ENCOUNTER — Encounter: Payer: Self-pay | Admitting: Physical Medicine and Rehabilitation

## 2021-05-24 DIAGNOSIS — M5416 Radiculopathy, lumbar region: Secondary | ICD-10-CM | POA: Diagnosis not present

## 2021-05-24 DIAGNOSIS — M4156 Other secondary scoliosis, lumbar region: Secondary | ICD-10-CM

## 2021-05-24 MED ORDER — METHYLPREDNISOLONE ACETATE 80 MG/ML IJ SUSP
80.0000 mg | Freq: Once | INTRAMUSCULAR | Status: AC
  Administered 2021-05-24: 80 mg

## 2021-05-24 NOTE — Progress Notes (Signed)
Pt state lower back pain that travels to left thigh and stops at the knee. Pt state standing, walking and bending makes the pain worse. Pt state he takes pain meds to help ease his pain.  Numeric Pain Rating Scale and Functional Assessment Average Pain 7   In the last MONTH (on 0-10 scale) has pain interfered with the following?  1. General activity like being  able to carry out your everyday physical activities such as walking, climbing stairs, carrying groceries, or moving a chair?  Rating(10)   +Driver, -BT, -Dye Allergies.

## 2021-05-24 NOTE — Progress Notes (Signed)
[name] - 71 y.o. male MRN [id number]  Date of birth: 03/26/1951  Office Visit Note: Visit Date: 05/24/2021 PCP: Donnajean Lopes, MD Referred by: Mcarthur Rossetti*  Subjective: Chief Complaint  Patient presents with   Lower Back - Pain   Left Thigh - Pain   HPI:  [name] is a 71 y.o. male who comes in today at the request of Dr. Jean Rosenthal for planned Bilateral L3-4 Lumbar Interlaminar epidural steroid injection with fluoroscopic guidance.  The patient has failed conservative care including home exercise, medications, time and activity modification.  This injection will be diagnostic and hopefully therapeutic.  Please see requesting physician notes for further details and justification. MRI reviewed with images and spine model.  MRI reviewed in the note below.  ROS Otherwise per HPI.  Assessment & Plan: Visit Diagnoses:    ICD-10-CM   1. Lumbar radiculopathy  M54.16 XR C-ARM NO REPORT    Epidural Steroid injection    methylPREDNISolone acetate (DEPO-MEDROL) injection 80 mg    2. 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   Meds & Orders:  Meds ordered this encounter  Medications   methylPREDNISolone acetate (DEPO-MEDROL) injection 80 mg    Orders Placed This Encounter  Procedures   XR C-ARM NO REPORT   Epidural Steroid injection    Follow-up: Return if symptoms worsen or fail to improve.   Procedures: No procedures performed  Lumbosacral Transforaminal Epidural Steroid Injection - Sub-Pedicular Approach with Fluoroscopic Guidance  Patient: [name]      Date of Birth: 04-17-1950 MRN: [id number] PCP: Donnajean Lopes, MD      Visit Date: 05/24/2021   Universal Protocol:    Date/Time: 05/24/2021  Consent Given By: the patient  Position: PRONE  Additional Comments: Vital signs were monitored before and after the procedure. Patient was prepped and draped in the usual sterile  fashion. The correct patient, procedure, and site was verified.   Injection Procedure Details:   Procedure diagnoses: Lumbar radiculopathy [M54.16]    Meds Administered:  Meds ordered this encounter  Medications   methylPREDNISolone acetate (DEPO-MEDROL) injection 80 mg    Laterality: Bilateral  Location/Site: L3  Needle:5.0 in., 22 ga.  Short bevel or Quincke spinal needle  Needle Placement: Transforaminal  Findings:    -Comments: Excellent flow of contrast along the nerve, nerve root and into the epidural space.  Procedure Details: After squaring off the end-plates to get a true AP view, the C-arm was positioned so that an oblique view of the foramen as noted above was visualized. The target area is just inferior to the "nose of the scotty dog" or sub pedicular. The soft tissues overlying this structure were infiltrated with 2-3 ml. of 1% Lidocaine without Epinephrine.  The spinal needle was inserted toward the target using a "trajectory" view along the fluoroscope beam.  Under AP and lateral visualization, the needle was advanced so it did not puncture dura and was located close the 6 O'Clock position of the pedical in AP tracterory. Biplanar projections were used to confirm position. Aspiration was confirmed to be negative for CSF and/or blood. A 1-2 ml. volume of Isovue-250 was injected and flow of contrast was noted at each level. Radiographs were obtained for documentation purposes.   After attaining the desired flow of contrast documented above, a 0.5 to 1.0 ml test dose of 0.25% Marcaine was injected into each respective transforaminal space.  The patient was observed for 90 seconds post injection.  After no sensory deficits were reported, and normal lower extremity motor function was noted,   the above injectate was administered so that equal amounts of the injectate were placed at each foramen (level) into the transforaminal epidural space.   Additional Comments:  The  patient tolerated the procedure well Dressing: 2 x 2 sterile gauze and Band-Aid    Post-procedure details: Patient was observed during the procedure. Post-procedure instructions were reviewed.  Patient left the clinic in stable condition.     Clinical History: MRI LUMBAR SPINE WITHOUT CONTRAST   TECHNIQUE: Multiplanar, multisequence MR imaging of the lumbar spine was performed. No intravenous contrast was administered.   COMPARISON:  04/28/2017   FINDINGS: Segmentation:  Standard.   Alignment: Grade 1 anterolisthesis of L5 on S1 secondary to facet disease. 2 mm retrolisthesis of L4 on L5.   Vertebrae: No acute fracture, evidence of discitis, or aggressive bone lesion.   Conus medullaris and cauda equina: Conus extends to the L1 level. Conus and cauda equina appear normal. Sacral Tarlov cyst noted.   Paraspinal and other soft tissues: No acute paraspinal abnormality.   Disc levels:   Disc spaces: Degenerative disease with disc height loss at L1-2, L2-3, L3-4, L4-5 and L5-S1 with reactive endplate edema at G4-0, L4-5 and L5-S1 which has progressed compared with the prior exam.   T12-L1: No significant disc bulge. No neural foraminal stenosis. No central canal stenosis.   L1-L2: Broad-based disc bulge eccentric towards the left. Moderate bilateral facet arthropathy. Left subarticular recess stenosis. Mild spinal stenosis. Mild left foraminal stenosis. No right foraminal stenosis. Right perineural cyst.   L2-L3: Broad-based disc bulge with a broad left paracentral/foraminal disc protrusion. Moderate bilateral facet arthropathy. Severe spinal stenosis. Left subarticular recess stenosis. Severe left foraminal stenosis. Mild right foraminal stenosis.   L3-L4: Mild broad-based disc bulge. Moderate bilateral facet arthropathy. No foraminal or central canal stenosis.   L4-L5: Broad-based disc bulge. Mild bilateral facet arthropathy. Bilateral subarticular recess  stenosis. Severe right foraminal stenosis. No left foraminal stenosis. No spinal stenosis.   L5-S1: Broad-based disc bulge. Moderate bilateral facet arthropathy. Moderate-severe bilateral foraminal stenosis. No spinal stenosis.   IMPRESSION: 1. Diffuse lumbar spine spondylosis as described above which has significantly progressed compared with 04/28/2017. 2. No acute osseous injury of the lumbar spine.     Electronically Signed   By: Kathreen Devoid M.D.   On: 05/05/2021 09:28     Objective:  VS:  HT:     WT:    BMI:      BP:    HR: bpm   TEMP: ( )   RESP:  Physical Exam Vitals and nursing note reviewed.  Constitutional:      General: He is not in acute distress.    Appearance: Normal appearance. He is not ill-appearing.  HENT:     Head: Normocephalic and atraumatic.     Right Ear: External ear normal.     Left Ear: External ear normal.     Nose: No congestion.  Eyes:     Extraocular Movements: Extraocular movements intact.  Cardiovascular:     Rate and Rhythm: Normal rate.     Pulses: Normal pulses.  Pulmonary:     Effort: Pulmonary effort is normal. No respiratory distress.  Abdominal:     General: There is no distension.     Palpations: Abdomen is soft.  Musculoskeletal:        General: No tenderness  or signs of injury.     Cervical back: Neck supple.     Right lower leg: No edema.     Left lower leg: No edema.     Comments: Patient has good distal strength without clonus. Scoliosis.  Skin:    Findings: No erythema or rash.  Neurological:     General: No focal deficit present.     Mental Status: He is alert and oriented to person, place, and time.     Sensory: No sensory deficit.     Motor: No weakness or abnormal muscle tone.     Coordination: Coordination normal.  Psychiatric:        Mood and Affect: Mood normal.        Behavior: Behavior normal.     Imaging: No results found.

## 2021-05-28 ENCOUNTER — Telehealth: Payer: Self-pay | Admitting: Physical Medicine and Rehabilitation

## 2021-05-28 NOTE — Telephone Encounter (Signed)
Pt called requesting another back injection appt. Pt states Dr. Ernestina Patches told him he may need 2 injections for it to go into effect. Please call pt at [phone].

## 2021-06-06 NOTE — Procedures (Signed)
Lumbosacral Transforaminal Epidural Steroid Injection - Sub-Pedicular Approach with Fluoroscopic Guidance  Patient: [name]      Date of Birth: 27-Dec-1950 MRN: [id number] PCP: Donnajean Lopes, MD      Visit Date: 05/24/2021   Universal Protocol:    Date/Time: 05/24/2021  Consent Given By: the patient  Position: PRONE  Additional Comments: Vital signs were monitored before and after the procedure. Patient was prepped and draped in the usual sterile fashion. The correct patient, procedure, and site was verified.   Injection Procedure Details:   Procedure diagnoses: Lumbar radiculopathy [M54.16]    Meds Administered:  Meds ordered this encounter  Medications   methylPREDNISolone acetate (DEPO-MEDROL) injection 80 mg    Laterality: Bilateral  Location/Site: L3  Needle:5.0 in., 22 ga.  Short bevel or Quincke spinal needle  Needle Placement: Transforaminal  Findings:    -Comments: Excellent flow of contrast along the nerve, nerve root and into the epidural space.  Procedure Details: After squaring off the end-plates to get a true AP view, the C-arm was positioned so that an oblique view of the foramen as noted above was visualized. The target area is just inferior to the "nose of the scotty dog" or sub pedicular. The soft tissues overlying this structure were infiltrated with 2-3 ml. of 1% Lidocaine without Epinephrine.  The spinal needle was inserted toward the target using a "trajectory" view along the fluoroscope beam.  Under AP and lateral visualization, the needle was advanced so it did not puncture dura and was located close the 6 O'Clock position of the pedical in AP tracterory. Biplanar projections were used to confirm position. Aspiration was confirmed to be negative for CSF and/or blood. A 1-2 ml. volume of Isovue-250 was injected and flow of contrast was noted at each level. Radiographs were obtained for documentation purposes.   After attaining the  desired flow of contrast documented above, a 0.5 to 1.0 ml test dose of 0.25% Marcaine was injected into each respective transforaminal space.  The patient was observed for 90 seconds post injection.  After no sensory deficits were reported, and normal lower extremity motor function was noted,   the above injectate was administered so that equal amounts of the injectate were placed at each foramen (level) into the transforaminal epidural space.   Additional Comments:  The patient tolerated the procedure well Dressing: 2 x 2 sterile gauze and Band-Aid    Post-procedure details: Patient was observed during the procedure. Post-procedure instructions were reviewed.  Patient left the clinic in stable condition.

## 2021-06-11 ENCOUNTER — Other Ambulatory Visit: Payer: Self-pay

## 2021-06-11 ENCOUNTER — Ambulatory Visit (INDEPENDENT_AMBULATORY_CARE_PROVIDER_SITE_OTHER): Payer: Medicare Other | Admitting: Physical Medicine and Rehabilitation

## 2021-06-11 ENCOUNTER — Encounter: Payer: Self-pay | Admitting: Physical Medicine and Rehabilitation

## 2021-06-11 ENCOUNTER — Ambulatory Visit: Payer: Self-pay

## 2021-06-11 VITALS — BP 118/67 | HR 99

## 2021-06-11 DIAGNOSIS — M5416 Radiculopathy, lumbar region: Secondary | ICD-10-CM

## 2021-06-11 MED ORDER — METHYLPREDNISOLONE ACETATE 80 MG/ML IJ SUSP
80.0000 mg | Freq: Once | INTRAMUSCULAR | Status: AC
  Administered 2021-06-11: 80 mg

## 2021-06-11 NOTE — Progress Notes (Signed)
Pt state lower back pain that travels to left thigh and stops at the knee. Pt state standing, walking and bending makes the pain worse. Pt state he takes pain meds to help ease his pain. Pt has hx of inj on 05/24/21 pt state it helped his right side but did nothing for his left side. ? ?Numeric Pain Rating Scale and Functional Assessment ?Average Pain 7 ? ? ?In the last MONTH (on 0-10 scale) has pain interfered with the following? ? ?1. General activity like being  able to carry out your everyday physical activities such as walking, climbing stairs, carrying groceries, or moving a chair?  ?Rating(10) ? ? ?+Driver, -BT, -Dye Allergies. ? ?

## 2021-06-11 NOTE — Patient Instructions (Signed)

## 2021-06-11 NOTE — Progress Notes (Signed)
[name] - 71 y.o. male MRN [id number]  Date of birth: 09-06-1950  Office Visit Note: Visit Date: 06/11/2021 PCP: Donnajean Lopes, MD Referred by: Donnajean Lopes, MD  Subjective: Chief Complaint  Patient presents with   Lower Back - Pain   Left Thigh - Pain   Left Knee - Pain   HPI:  [name] is a 71 y.o. male who comes in today at the request of Dr. Jean Rosenthal for planned Left L3-4 Lumbar Interlaminar epidural steroid injection with fluoroscopic guidance.  The patient has failed conservative care including home exercise, medications, time and activity modification.  This injection will be diagnostic and hopefully therapeutic.  Please see requesting physician notes for further details and justification.  Prior bilateral L3 transforaminal injection gave him good relief on the right and temporarily gave him 50% relief diagnostically on the left.  He is having low back pain referring into the left thigh worse with standing and ambulating.  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  Had a long discussion today with the patient and his wife.  We are going to complete L3-4 interlaminar injection.  Depending on relief would look at L2 transforaminal injection.  Symptoms more of an L3 dermatome.  Unfortunately patient may need to consider surgical evaluation although it would probably be a significant fusion with his degenerative scoliosis.  They did ask about pain medications.  Patient is already on 300 mg of gabapentin twice daily as well as tramadol 180 tablets/month.  This is managed by Dr. Philip Aspen.  He was also given a small amount of Tylenol 3 by Dr. Ninfa Linden.  We had a discussion today about medications and role in radiculopathy radiculitis from stenosis.  Patient will continue current course of medications and we hope the injection does help in a few days.  ROS Otherwise per  HPI.  Assessment & Plan: Visit Diagnoses:    ICD-10-CM   1. Lumbar radiculopathy  M54.16 XR C-ARM NO REPORT    Epidural Steroid injection    methylPREDNISolone acetate (DEPO-MEDROL) injection 80 mg      Plan: No additional findings.   Meds & Orders:  Meds ordered this encounter  Medications   methylPREDNISolone acetate (DEPO-MEDROL) injection 80 mg    Orders Placed This Encounter  Procedures   XR C-ARM NO REPORT   Epidural Steroid injection    Follow-up: Return if symptoms worsen or fail to improve.   Procedures: No procedures performed  Lumbar Epidural Steroid Injection - Interlaminar Approach with Fluoroscopic Guidance  Patient: [name]      Date of Birth: 1950/05/28 MRN: [id number] PCP: Donnajean Lopes, MD      Visit Date: 06/11/2021   Universal Protocol:     Consent Given By: the patient  Position: PRONE  Additional Comments: Vital signs were monitored before and after the procedure. Patient was prepped and draped in the usual sterile fashion. The correct patient, procedure, and site was verified.   Injection Procedure Details:   Procedure diagnoses: Lumbar radiculopathy [M54.16]   Meds Administered:  Meds ordered this encounter  Medications   methylPREDNISolone acetate (DEPO-MEDROL) injection 80 mg     Laterality: Left  Location/Site:  L3-4  Needle: 3.5 in., 20 ga. Tuohy  Needle Placement: Paramedian epidural  Findings:   -Comments: Excellent flow of contrast into the epidural space.  Procedure Details: Using a paramedian approach from the side mentioned above,  the region overlying the inferior lamina was localized under fluoroscopic visualization and the soft tissues overlying this structure were infiltrated with 4 ml. of 1% Lidocaine without Epinephrine. The Tuohy needle was inserted into the epidural space using a paramedian approach.   The epidural space was localized using loss of resistance along with counter oblique bi-planar  fluoroscopic views.  After negative aspirate for air, blood, and CSF, a 2 ml. volume of Isovue-250 was injected into the epidural space and the flow of contrast was observed. Radiographs were obtained for documentation purposes.    The injectate was administered into the level noted above.   Additional Comments:  The patient tolerated the procedure well Dressing: 2 x 2 sterile gauze and Band-Aid    Post-procedure details: Patient was observed during the procedure. Post-procedure instructions were reviewed.  Patient left the clinic in stable condition.   Clinical History: MRI LUMBAR SPINE WITHOUT CONTRAST   TECHNIQUE: Multiplanar, multisequence MR imaging of the lumbar spine was performed. No intravenous contrast was administered.   COMPARISON:  04/28/2017   FINDINGS: Segmentation:  Standard.   Alignment: Grade 1 anterolisthesis of L5 on S1 secondary to facet disease. 2 mm retrolisthesis of L4 on L5.   Vertebrae: No acute fracture, evidence of discitis, or aggressive bone lesion.   Conus medullaris and cauda equina: Conus extends to the L1 level. Conus and cauda equina appear normal. Sacral Tarlov cyst noted.   Paraspinal and other soft tissues: No acute paraspinal abnormality.   Disc levels:   Disc spaces: Degenerative disease with disc height loss at L1-2, L2-3, L3-4, L4-5 and L5-S1 with reactive endplate edema at B4-4, L4-5 and L5-S1 which has progressed compared with the prior exam.   T12-L1: No significant disc bulge. No neural foraminal stenosis. No central canal stenosis.   L1-L2: Broad-based disc bulge eccentric towards the left. Moderate bilateral facet arthropathy. Left subarticular recess stenosis. Mild spinal stenosis. Mild left foraminal stenosis. No right foraminal stenosis. Right perineural cyst.   L2-L3: Broad-based disc bulge with a broad left paracentral/foraminal disc protrusion. Moderate bilateral facet arthropathy. Severe spinal stenosis.  Left subarticular recess stenosis. Severe left foraminal stenosis. Mild right foraminal stenosis.   L3-L4: Mild broad-based disc bulge. Moderate bilateral facet arthropathy. No foraminal or central canal stenosis.   L4-L5: Broad-based disc bulge. Mild bilateral facet arthropathy. Bilateral subarticular recess stenosis. Severe right foraminal stenosis. No left foraminal stenosis. No spinal stenosis.   L5-S1: Broad-based disc bulge. Moderate bilateral facet arthropathy. Moderate-severe bilateral foraminal stenosis. No spinal stenosis.   IMPRESSION: 1. Diffuse lumbar spine spondylosis as described above which has significantly progressed compared with 04/28/2017. 2. No acute osseous injury of the lumbar spine.     Electronically Signed   By: Kathreen Devoid M.D.   On: 05/05/2021 09:28     Objective:  VS:  HT:     WT:    BMI:      BP:118/67   HR:99bpm   TEMP: ( )   RESP:  Physical Exam Vitals and nursing note reviewed.  Constitutional:      General: He is not in acute distress.    Appearance: Normal appearance. He is not ill-appearing.     Comments: Thin  HENT:     Head: Normocephalic and atraumatic.     Right Ear: External ear normal.     Left Ear: External ear normal.     Nose: No congestion.  Eyes:     Extraocular Movements: Extraocular movements intact.  Cardiovascular:  Rate and Rhythm: Normal rate.     Pulses: Normal pulses.  Pulmonary:     Effort: Pulmonary effort is normal. No respiratory distress.  Abdominal:     General: There is no distension.     Palpations: Abdomen is soft.  Musculoskeletal:        General: No tenderness or signs of injury.     Cervical back: Neck supple.     Right lower leg: No edema.     Left lower leg: No edema.     Comments: Patient has good distal strength without clonus.  Skin:    Findings: No erythema or rash.  Neurological:     General: No focal deficit present.     Mental Status: He is alert and oriented to person, place,  and time.     Sensory: No sensory deficit.     Motor: No weakness or abnormal muscle tone.     Coordination: Coordination normal.  Psychiatric:        Mood and Affect: Mood normal.        Behavior: Behavior normal.     Imaging: XR C-ARM NO REPORT  Result Date: 06/11/2021 Please see Notes tab for imaging impression.

## 2021-06-11 NOTE — Procedures (Signed)
Lumbar Epidural Steroid Injection - Interlaminar Approach with Fluoroscopic Guidance ? ?Patient: [name]      ?Date of Birth: Aug 04, 1950 ?MRN: [id number] ?PCP: Donnajean Lopes, MD      ?Visit Date: 06/11/2021 ?  ?Universal Protocol:    ? ?Consent Given By: the patient ? ?Position: PRONE ? ?Additional Comments: ?Vital signs were monitored before and after the procedure. ?Patient was prepped and draped in the usual sterile fashion. ?The correct patient, procedure, and site was verified. ? ? ?Injection Procedure Details:  ? ?Procedure diagnoses: Lumbar radiculopathy [M54.16]  ? ?Meds Administered:  ?Meds ordered this encounter  ?Medications  ? methylPREDNISolone acetate (DEPO-MEDROL) injection 80 mg  ?  ? ?Laterality: Left ? ?Location/Site:  L3-4 ? ?Needle: 3.5 in., 20 ga. Tuohy ? ?Needle Placement: Paramedian epidural ? ?Findings:  ? -Comments: Excellent flow of contrast into the epidural space. ? ?Procedure Details: ?Using a paramedian approach from the side mentioned above, the region overlying the inferior lamina was localized under fluoroscopic visualization and the soft tissues overlying this structure were infiltrated with 4 ml. of 1% Lidocaine without Epinephrine. The Tuohy needle was inserted into the epidural space using a paramedian approach.  ? ?The epidural space was localized using loss of resistance along with counter oblique bi-planar fluoroscopic views.  After negative aspirate for air, blood, and CSF, a 2 ml. volume of Isovue-250 was injected into the epidural space and the flow of contrast was observed. Radiographs were obtained for documentation purposes.   ? ?The injectate was administered into the level noted above. ? ? ?Additional Comments:  ?The patient tolerated the procedure well ?Dressing: 2 x 2 sterile gauze and Band-Aid ?  ? ?Post-procedure details: ?Patient was observed during the procedure. ?Post-procedure instructions were reviewed. ? ?Patient left the clinic in stable  condition. ?

## 2021-08-07 ENCOUNTER — Telehealth: Payer: Self-pay | Admitting: Physician Assistant

## 2021-08-07 NOTE — Telephone Encounter (Signed)
Received call from patient, he would like to get copy of Lumbar MRI report. I advised him need to sign authorization when comes in to pickup. Copy is ready at front desk. ?

## 2022-01-23 ENCOUNTER — Ambulatory Visit
Admission: RE | Admit: 2022-01-23 | Discharge: 2022-01-23 | Disposition: A | Discharge status: Home / Self Care | Payer: Medicare Other | Source: Ambulatory Visit | Attending: Internal Medicine | Admitting: Internal Medicine

## 2022-01-23 ENCOUNTER — Other Ambulatory Visit: Payer: Self-pay | Admitting: Internal Medicine

## 2022-01-23 DIAGNOSIS — R55 Syncope and collapse: Secondary | ICD-10-CM

## 2022-03-20 ENCOUNTER — Encounter: Payer: Self-pay | Admitting: *Deleted

## 2022-04-24 ENCOUNTER — Encounter: Payer: Self-pay | Admitting: Cardiology

## 2022-04-24 ENCOUNTER — Ambulatory Visit: Payer: Medicare Other | Attending: Cardiology | Admitting: Cardiology

## 2022-04-24 ENCOUNTER — Other Ambulatory Visit: Payer: Self-pay

## 2022-04-24 VITALS — BP 96/58 | HR 73 | Ht 73.0 in | Wt 107.2 lb

## 2022-04-24 DIAGNOSIS — Z01812 Encounter for preprocedural laboratory examination: Secondary | ICD-10-CM | POA: Diagnosis not present

## 2022-04-24 DIAGNOSIS — R55 Syncope and collapse: Secondary | ICD-10-CM

## 2022-04-24 DIAGNOSIS — R0789 Other chest pain: Secondary | ICD-10-CM | POA: Diagnosis not present

## 2022-04-24 MED ORDER — METOPROLOL TARTRATE 100 MG PO TABS: 100.0000 mg | ORAL_TABLET | Freq: Once | ORAL | 0 refills | Status: DC

## 2022-04-24 NOTE — Progress Notes (Signed)
Cardiology CONSULT Note    Date:  04/24/2022   ID:  [name], DOB 01-25-1951, MRN [id number]  PCP:  Donnajean Lopes, MD  Cardiologist:  Fransico Him, MD   Chief Complaint  Patient presents with   New Patient (Initial Visit)    Syncope    History of Present Illness:  [name] is a 72 y.o. male who is being seen today for the evaluation of Presyncope at the request of Donnajean Lopes, MD.  This is a 72yo AAM with a hx of COPD, depression and anxiety who presents for evaluation of presyncope.  He tells me that he gets very shaky and then wakes up on the ground.  He has had 3 episodes in 2 days last week.  He has had 1 episode this week.  He is not sure that he loses consciousness and is aware of everything that has happened.  They always occur when he is up standing or trying to walk.  He says that he will notice his heart racing some prior to the episodes.  He will get nauseated as well before the event and will get a chill.  He occasionally will have some chest pressure prior to the event as well.  He noticed some DOE for a few years which remains stable.   He says that his episodes start off with his right hand shaking and then his shaking goes to his knees and his legs give out and he falls down.  He is aware of everything going on throughout the event so it does not sound like he loses consciousness. He has had problems gaining weight and is on an appetite suppressant.   Past Medical History:  Diagnosis Date   Anxiety    Arthritis    Cataract    COPD (chronic obstructive pulmonary disease) ([place])    inhaler   Depression    Neuromuscular disorder ([place])    bulding disc    Past Surgical History:  Procedure Laterality Date   BACK SURGERY     LUMBAR DISC SURGERY Left 06/2003   L4 on L3 hemilaminectomy, foraminotomy for 3-4,4-5 with left L3-L4 extraforaminal diskectomy/notes 08/22/2010    REPAIR DURAL / CSF LEAK  07/2003   Archie Endo 08/22/2010    Current  Medications: Current Meds  Medication Sig   acetaminophen-codeine (TYLENOL #3) 300-30 MG tablet Take 1-2 tablets by mouth every 8 (eight) hours as needed.   diclofenac (VOLTAREN) 75 MG EC tablet Take 75 mg by mouth 2 (two) times daily.   DULoxetine (CYMBALTA) 30 MG capsule Take 30 mg by mouth daily.   gabapentin (NEURONTIN) 300 MG capsule Take 300 mg by mouth 3 (three) times daily.   HYDROcodone-acetaminophen (NORCO) 7.5-325 MG tablet Take 1 tablet by mouth every 6 (six) hours as needed for moderate pain or severe pain.   ipratropium-albuterol (DUONEB) 0.5-2.5 (3) MG/3ML SOLN Take 3 mLs by nebulization 4 (four) times daily.   lactose free nutrition (BOOST) LIQD Take 237 mLs by mouth 3 (three) times daily between meals.   levothyroxine (SYNTHROID) 25 MCG tablet Take 25 mcg by mouth 3 (three) times a week.   metoprolol tartrate (LOPRESSOR) 100 MG tablet Take 1 tablet (100 mg total) by mouth once for 1 dose. Take 90-120 minutes prior to scan.   mirtazapine (REMERON) 30 MG tablet Take 30 mg by mouth at bedtime.   Multiple Vitamins-Minerals (CENTRUM SILVER 50+MEN) TABS Take 1 tablet by mouth daily.   polyethylene glycol (  MIRALAX / GLYCOLAX) 17 g packet Take 17 g by mouth daily as needed for mild constipation.   traZODone (DESYREL) 50 MG tablet Take 25-50 mg by mouth at bedtime as needed for sleep.   [DISCONTINUED] Artificial Tear Ointment (DRY EYES OP) Place 2 drops into both eyes 2 (two) times daily as needed (dry eyes).   [DISCONTINUED] citalopram (CELEXA) 20 MG tablet Take 20 mg by mouth daily.   [DISCONTINUED] cyclobenzaprine (FLEXERIL) 10 MG tablet Take 1 tablet (10 mg total) by mouth 3 (three) times daily as needed for muscle spasms.   [DISCONTINUED] fluticasone-salmeterol (ADVAIR) 500-50 MCG/ACT AEPB Inhale 1 puff into the lungs in the morning and at bedtime.   [DISCONTINUED] magic mouthwash w/lidocaine SOLN Take 5 mLs by mouth 4 (four) times daily. Swish and swallow   [DISCONTINUED] mupirocin  ointment (BACTROBAN) 2 % Place 1 application into the nose 2 (two) times daily.   [DISCONTINUED] nicotine (NICODERM CQ - DOSED IN MG/24 HOURS) 21 mg/24hr patch Place 21 mg onto the skin every morning.   [DISCONTINUED] promethazine (PHENERGAN) 25 MG tablet Take 25 mg by mouth every 8 (eight) hours as needed for nausea or vomiting.   [DISCONTINUED] traMADol (ULTRAM) 50 MG tablet Take 100 mg by mouth 3 (three) times daily.    Allergies:   Erythromycin base, Mirtazapine, and Nsaids   Social History   Socioeconomic History   Marital status: Married    Spouse name: Not on file   Number of children: Not on file   Years of education: Not on file   Highest education level: Not on file  Occupational History   Not on file  Tobacco Use   Smoking status: Every Day    Packs/day: 1.50    Years: 43.00    Total pack years: 64.50    Types: Cigarettes   Smokeless tobacco: Never   Tobacco comments:    tried chantix couldn't take it  Vaping Use   Vaping Use: Never used  Substance and Sexual Activity   Alcohol use: Not Currently    Alcohol/week: 6.0 standard drinks of alcohol    Types: 6 Cans of beer per week    Comment: 2 times a week   Drug use: No   Sexual activity: Not Currently  Other Topics Concern   Not on file  Social History Narrative   Right handed   Lives wife in a one story home   Social Determinants of Health   Financial Resource Strain: Not on file  Food Insecurity: Not on file  Transportation Needs: Not on file  Physical Activity: Not on file  Stress: Not on file  Social Connections: Not on file     Family History:  The patient's family history includes Colon cancer in his paternal grandfather; Colon polyps in his father; Diabetes in his brother, father, paternal uncle, and son; Heart disease in his father; Heart failure in his father; Muscular dystrophy in his mother; Stroke in his father.   ROS:   Please see the history of present illness.    ROS All other systems  reviewed and are negative.      No data to display             PHYSICAL EXAM:   VS:  BP (!) 96/58   Pulse 73   Ht '6\' 1"'$  (1.854 m)   Wt 107 lb 3.2 oz (48.6 kg)   SpO2 96%   BMI 14.14 kg/m    Orthostatic VS for the past 24 hrs (  Last 3 readings):  BP- Lying Pulse- Lying BP- Sitting Pulse- Sitting BP- Standing at 0 minutes Pulse- Standing at 0 minutes BP- Standing at 3 minutes Pulse- Standing at 3 minutes  04/24/22 1105 120/77 63 111/71 74 118/71 85 108/70 91     GEN: Well nourished, well developed, in no acute distress  HEENT: normal  Neck: no JVD, carotid bruits, or masses Cardiac: RRR; no murmurs, rubs, or gallops,no edema.  Intact distal pulses bilaterally.  Respiratory:  clear to auscultation bilaterally, normal work of breathing GI: soft, nontender, nondistended, + BS MS: no deformity or atrophy  Skin: warm and dry, no rash Neuro:  Alert and Oriented x 3, Strength and sensation are intact Psych: euthymic mood, full affect  Wt Readings from Last 3 Encounters:  04/24/22 107 lb 3.2 oz (48.6 kg)  01/26/21 97 lb 10.6 oz (44.3 kg)  11/08/20 104 lb 15 oz (47.6 kg)      Studies/Labs Reviewed:   EKG:  EKG is not ordered today.    Recent Labs: No results found for requested labs within last 365 days.   Lipid Panel    Component Value Date/Time   CHOL 170 04/12/2014 1525   TRIG 64 04/12/2014 1525   HDL 58 04/12/2014 1525   CHOLHDL 2.9 04/12/2014 1525   VLDL 13 04/12/2014 1525   LDLCALC 99 04/12/2014 1525    Additional studies/ records that were reviewed today include:  none    ASSESSMENT:    1. Syncope, unspecified syncope type   2. Other chest pain      PLAN:  In order of problems listed above:  Presyncope -it does not sound like he has frank syncope.   -suspect that he is having orthostatic hypotension based on low BP readings today and his symptoms that only occur when standing -Since he complains of palpitations prior to the events, I have  recommended an event monitor to rule out arrhythmia -2D echo in 2022 with normal LVF>>will repeat -he is orthostatic on exam today -check am cortisol to rule out adrenal insufficiency -I gave him a Rx for thigh high compression hose to wear daily -I will see him back in 4 weeks and if still having presyncope then will add midodrine   2.  Chest pain -he has had intermittent CP when he has his presyncopal events -EKG is normal -he has CRFs including ongoing tobacco use and ? Fm hx in his father -check coronary CTA to rule out CAD given his SOB and CP at times  Time Spent: 20 minutes total time of encounter, including 15 minutes spent in face-to-face patient care on the date of this encounter. This time includes coordination of care and counseling regarding above mentioned problem list. Remainder of non-face-to-face time involved reviewing chart documents/testing relevant to the patient encounter and documentation in the medical record. I have independently reviewed documentation from referring provider  Medication Adjustments/Labs and Tests Ordered: Current medicines are reviewed at length with the patient today.  Concerns regarding medicines are outlined above.  Medication changes, Labs and Tests ordered today are listed in the Patient Instructions below.  Patient Instructions  Medication Instructions:  Your physician recommends that you continue on your current medications as directed. Please refer to the Current Medication list given to you today.  *If you need a refill on your cardiac medications before your next appointment, please call your pharmacy*   Lab Work: Your physician has ordered an 8 AM Serum cortisol lab.  If you have labs (  blood work) drawn today and your tests are completely normal, you will receive your results only by: [place] (if you have MyChart) OR A paper copy in the mail If you have any lab test that is abnormal or we need to change your treatment, we  will call you to review the results.   Testing/Procedures: Your physician has requested that you have an echocardiogram. Echocardiography is a painless test that uses sound waves to create images of your heart. It provides your doctor with information about the size and shape of your heart and how well your heart's chambers and valves are working. This procedure takes approximately one hour. There are no restrictions for this procedure. Please do NOT wear cologne, perfume, aftershave, or lotions (deodorant is allowed). Please arrive 15 minutes prior to your appointment time.   Your physician has recommended that you wear an event monitor for 30 days. Event monitors are medical devices that record the heart's electrical activity. Doctors most often [place] these monitors to diagnose arrhythmias. Arrhythmias are problems with the speed or rhythm of the heartbeat. The monitor is a small, portable device. You can wear one while you do your normal daily activities. This is usually used to diagnose what is causing palpitations/syncope (passing out).   Your physician has requested that you have cardiac CT. Cardiac computed tomography (CT) is a painless test that uses an x-ray machine to take clear, detailed pictures of your heart. For further information please visit [email or web address].[email or web address]. Please follow instruction sheet as given.    Your cardiac CT will be scheduled at one of the below locations:   [facility] [address] [place], [place] [address] [phone]   Please arrive at the [facility] and Children's Entrance (Entrance C2) of [facility] 30 minutes prior to test start time. You can use the FREE valet parking offered at entrance C (encouraged to control the heart rate for the test)  Proceed to the [facility] Radiology Department (first floor) to check-in and test prep.  All radiology patients and guests should use entrance C2 at [facility], accessed from [facility], even though the hospital's physical address listed is [address]      Please follow these instructions carefully (unless otherwise directed):  Hold all erectile dysfunction medications at least 3 days (72 hrs) prior to test. (Ie viagra, cialis, sildenafil, tadalafil, etc) We will administer nitroglycerin during this exam.   On the Night Before the Test: Be sure to Drink plenty of water. Do not consume any caffeinated/decaffeinated beverages or chocolate 12 hours prior to your test. Do not take any antihistamines 12 hours prior to your test.  On the Day of the Test: Drink plenty of water until 1 hour prior to the test. Do not eat any food 1 hour prior to test. You may take your regular medications prior to the test.  Take metoprolol (Lopressor) two hours prior to test. HOLD Furosemide/Hydrochlorothiazide morning of the test. FEMALES- please wear underwire-free bra if available, avoid dresses & tight clothing   You may be required to take 100 mg of a medication called metoprolol 1-2 hours prior to this test.        After the Test: Drink plenty of water. After receiving IV contrast, you may experience a mild flushed feeling. This is normal. On occasion, you may experience a mild rash up to 24 hours after the test. This is not dangerous. If this occurs, you can take Benadryl  25 mg and increase your fluid intake. If you experience trouble breathing, this can be serious. If it is severe call 911 IMMEDIATELY. If it is mild, please call our office. If you take any of these medications: Glipizide/Metformin, Avandament, Glucavance, please do not take 48 hours after completing test unless otherwise instructed.  We will call to schedule your test 2-4 weeks out understanding that some insurance companies will need an authorization prior to the service being performed.   For non-scheduling related questions, please contact the cardiac imaging nurse navigator  should you have any questions/concerns: Marchia Bond, Cardiac Imaging Nurse Navigator Gordy Clement, Cardiac Imaging Nurse Navigator [facility] [facility] Direct Office Dial: [phone]   For scheduling needs, including cancellations and rescheduling, please call [place], [phone].    Follow-Up: At [facility], you and your health needs are our priority.  As part of our continuing mission to provide you with exceptional heart care, we have created designated Provider Care Teams.  These Care Teams include your primary Cardiologist (physician) and Advanced Practice Providers (APPs -  Physician Assistants and Nurse Practitioners) who all work together to provide you with the care you need, when you need it.  We recommend signing up for the patient portal called "MyChart".  Sign up information is provided on this After Visit Summary.  MyChart is used to connect with patients for Virtual Visits (Telemedicine).  Patients are able to view lab/test results, encounter notes, upcoming appointments, etc.  Non-urgent messages can be sent to your provider as well.   To learn more about what you can do with MyChart, go to [email or web address].    Your next appointment:   4 weeks  Provider:   Dr. Fransico Him, MD     Signed, Fransico Him, MD  04/24/2022 11:31 AM    [address], [place], [place]  [address] Phone: [phone]; Fax: [phone]

## 2022-04-24 NOTE — Addendum Note (Signed)
Addended by: Joni Reining on: 04/24/2022 12:44 PM   Modules accepted: Orders

## 2022-04-24 NOTE — Patient Instructions (Addendum)
Medication Instructions:  Your physician recommends that you continue on your current medications as directed. Please refer to the Current Medication list given to you today.  *If you need a refill on your cardiac medications before your next appointment, please call your pharmacy*   Lab Work: Your physician has ordered an 8 AM Serum cortisol lab.  If you have labs (blood work) drawn today and your tests are completely normal, you will receive your results only by: [place] (if you have MyChart) OR A paper copy in the mail If you have any lab test that is abnormal or we need to change your treatment, we will call you to review the results.   Testing/Procedures: Your physician has requested that you have an echocardiogram. Echocardiography is a painless test that uses sound waves to create images of your heart. It provides your doctor with information about the size and shape of your heart and how well your heart's chambers and valves are working. This procedure takes approximately one hour. There are no restrictions for this procedure. Please do NOT wear cologne, perfume, aftershave, or lotions (deodorant is allowed). Please arrive 15 minutes prior to your appointment time.   Your physician has recommended that you wear an event monitor for 30 days. Event monitors are medical devices that record the heart's electrical activity. Doctors most often [place] these monitors to diagnose arrhythmias. Arrhythmias are problems with the speed or rhythm of the heartbeat. The monitor is a small, portable device. You can wear one while you do your normal daily activities. This is usually used to diagnose what is causing palpitations/syncope (passing out).   Your physician has requested that you have cardiac CT. Cardiac computed tomography (CT) is a painless test that uses an x-ray machine to take clear, detailed pictures of your heart. For further information please visit HugeFiesta.[email or web address]. Please  follow instruction sheet as given.    Your cardiac CT will be scheduled at one of the below locations:   [facility] [address] [place], [place] [address] [phone]   Please arrive at the [facility] and Children's Entrance (Entrance C2) of [facility] 30 minutes prior to test start time. You can use the FREE valet parking offered at entrance C (encouraged to control the heart rate for the test)  Proceed to the [facility] Radiology Department (first floor) to check-in and test prep.  All radiology patients and guests should use entrance C2 at [facility], accessed from [facility], even though the hospital's physical address listed is [address]      Please follow these instructions carefully (unless otherwise directed):  Hold all erectile dysfunction medications at least 3 days (72 hrs) prior to test. (Ie viagra, cialis, sildenafil, tadalafil, etc) We will administer nitroglycerin during this exam.   On the Night Before the Test: Be sure to Drink plenty of water. Do not consume any caffeinated/decaffeinated beverages or chocolate 12 hours prior to your test. Do not take any antihistamines 12 hours prior to your test.  On the Day of the Test: Drink plenty of water until 1 hour prior to the test. Do not eat any food 1 hour prior to test. You may take your regular medications prior to the test.  Take metoprolol (Lopressor) two hours prior to test. HOLD Furosemide/Hydrochlorothiazide morning of the test. FEMALES- please wear underwire-free bra if available, avoid dresses & tight clothing   You may be required to take 100 mg of a medication  called metoprolol 1-2 hours prior to this test.        After the Test: Drink plenty of water. After receiving IV contrast, you may experience a mild flushed feeling. This is normal. On occasion, you may experience a mild rash up to 24 hours after the test. This is not dangerous. If  this occurs, you can take Benadryl 25 mg and increase your fluid intake. If you experience trouble breathing, this can be serious. If it is severe call 911 IMMEDIATELY. If it is mild, please call our office. If you take any of these medications: Glipizide/Metformin, Avandament, Glucavance, please do not take 48 hours after completing test unless otherwise instructed.  We will call to schedule your test 2-4 weeks out understanding that some insurance companies will need an authorization prior to the service being performed.   For non-scheduling related questions, please contact the cardiac imaging nurse navigator should you have any questions/concerns: Marchia Bond, Cardiac Imaging Nurse Navigator Gordy Clement, Cardiac Imaging Nurse Navigator [place] [facility] Direct Office Dial: [phone]   For scheduling needs, including cancellations and rescheduling, please call [place], [phone].    Follow-Up: At [facility], you and your health needs are our priority.  As part of our continuing mission to provide you with exceptional heart care, we have created designated Provider Care Teams.  These Care Teams include your primary Cardiologist (physician) and Advanced Practice Providers (APPs -  Physician Assistants and Nurse Practitioners) who all work together to provide you with the care you need, when you need it.  We recommend signing up for the patient portal called "MyChart".  Sign up information is provided on this After Visit Summary.  MyChart is used to connect with patients for Virtual Visits (Telemedicine).  Patients are able to view lab/test results, encounter notes, upcoming appointments, etc.  Non-urgent messages can be sent to your provider as well.   To learn more about what you can do with MyChart, go to [email or web address].    Your next appointment:   4 weeks  Provider:   Dr. Fransico Him, MD

## 2022-04-26 LAB — BASIC METABOLIC PANEL
BUN/Creatinine Ratio: 26 — ABNORMAL HIGH (ref 10–24)
BUN: 23 mg/dL (ref 8–27)
CO2: 22 mmol/L (ref 20–29)
Calcium: 10 mg/dL (ref 8.6–10.2)
Chloride: 99 mmol/L (ref 96–106)
Creatinine, Ser: 0.9 mg/dL (ref 0.76–1.27)
Glucose: 110 mg/dL — ABNORMAL HIGH (ref 70–99)
Potassium: 4.9 mmol/L (ref 3.5–5.2)
Sodium: 136 mmol/L (ref 134–144)
eGFR: 91 mL/min/{1.73_m2} (ref >59)

## 2022-04-29 ENCOUNTER — Telehealth (HOSPITAL_COMMUNITY): Payer: Self-pay | Admitting: Emergency Medicine

## 2022-04-29 NOTE — Telephone Encounter (Signed)
Attempted to call patient regarding upcoming cardiac CT appointment. Left message on voicemail with name and callback number Marchia Bond RN Navigator [facility] [phone] Office [phone] Cell

## 2022-04-30 ENCOUNTER — Encounter (HOSPITAL_COMMUNITY): Payer: Self-pay

## 2022-04-30 ENCOUNTER — Ambulatory Visit (HOSPITAL_COMMUNITY)
Admission: RE | Admit: 2022-04-30 | Discharge: 2022-04-30 | Disposition: A | Discharge status: Home / Self Care | Payer: Medicare Other | Source: Ambulatory Visit | Attending: Cardiology | Admitting: Cardiology

## 2022-04-30 ENCOUNTER — Other Ambulatory Visit (HOSPITAL_COMMUNITY): Payer: Self-pay | Admitting: *Deleted

## 2022-04-30 DIAGNOSIS — R0789 Other chest pain: Secondary | ICD-10-CM

## 2022-04-30 LAB — CORTISOL, FREE: Cortisol, Free Dialysis, LCMS: 0.718 ug/dL

## 2022-04-30 MED ORDER — IVABRADINE HCL 7.5 MG PO TABS: ORAL_TABLET | ORAL | 0 refills | Status: DC

## 2022-04-30 NOTE — Progress Notes (Signed)
Patient arrived for Cardiac CT. Was prescribed Metoprolol '100mg'$  for Cardiac CT however did not take medication. Heart rate 82-86 with low blood pressure. Spoke with Dr Johnsie Cancel and Kerin Ransom RN nurse navigator. Reschedule with pre medication Ivabradine. Given number [phone] to call with any questions and scheduler will contact him to schedule a new time. Patient verbalized understanding plan of care.

## 2022-05-01 ENCOUNTER — Telehealth (HOSPITAL_COMMUNITY): Payer: Self-pay | Admitting: *Deleted

## 2022-05-01 ENCOUNTER — Telehealth: Payer: Self-pay | Admitting: Cardiology

## 2022-05-01 NOTE — Telephone Encounter (Signed)
Called and left detailed message on voice mail per [facility] letting patient know he can pick up Corlanor sample at the [facility] prior to his CT scan on 05/03/22.

## 2022-05-01 NOTE — Telephone Encounter (Signed)
Melissa PharmD has placed Corlanor sample up front at [facility] office for pt.

## 2022-05-01 NOTE — Telephone Encounter (Signed)
Patient is calling wanting to confirm there will be a sample of ivabradine for him to pick up on 01/26 at 11:00 am to take prior to his CT that same day at 12:30 pm. Please advise.

## 2022-05-01 NOTE — Telephone Encounter (Signed)
Reaching out to patient to offer assistance regarding upcoming cardiac imaging study; pt verbalizes understanding of appt date/time, parking situation and where to check in, pre-test NPO status and medications ordered, and verified current allergies; name and call back number provided for further questions should they arise  Gordy Clement RN [facility] [phone] office [phone] cell  Patient unable to afford ivabradine for test. Sample arranged with the cardiologist office for patient to pick up for CCTA. Patient to take '15mg'$  ivabradine TWO hours prior to his cardiac CT scan. He is aware to arrive at 12:30 PM.

## 2022-05-03 ENCOUNTER — Ambulatory Visit (HOSPITAL_COMMUNITY)
Admission: RE | Admit: 2022-05-03 | Discharge: 2022-05-03 | Disposition: A | Discharge status: Home / Self Care | Payer: Medicare Other | Source: Ambulatory Visit | Attending: Cardiology | Admitting: Cardiology

## 2022-05-03 ENCOUNTER — Other Ambulatory Visit: Payer: Self-pay | Admitting: Internal Medicine

## 2022-05-03 DIAGNOSIS — R931 Abnormal findings on diagnostic imaging of heart and coronary circulation: Secondary | ICD-10-CM

## 2022-05-03 DIAGNOSIS — I2510 Atherosclerotic heart disease of native coronary artery without angina pectoris: Secondary | ICD-10-CM | POA: Diagnosis not present

## 2022-05-03 DIAGNOSIS — R0789 Other chest pain: Secondary | ICD-10-CM | POA: Diagnosis present

## 2022-05-03 DIAGNOSIS — J439 Emphysema, unspecified: Secondary | ICD-10-CM | POA: Diagnosis not present

## 2022-05-03 DIAGNOSIS — I700 Atherosclerosis of aorta: Secondary | ICD-10-CM | POA: Insufficient documentation

## 2022-05-03 MED ORDER — NITROGLYCERIN 0.4 MG SL SUBL
0.8000 mg | SUBLINGUAL_TABLET | Freq: Once | SUBLINGUAL | Status: AC
  Administered 2022-05-03: 0.8 mg via SUBLINGUAL

## 2022-05-03 MED ORDER — NITROGLYCERIN 0.4 MG SL SUBL
SUBLINGUAL_TABLET | SUBLINGUAL | Status: AC
  Filled 2022-05-03: qty 2

## 2022-05-03 MED ORDER — IOHEXOL 350 MG/ML SOLN
100.0000 mL | Freq: Once | INTRAVENOUS | Status: AC | PRN
  Administered 2022-05-03: 100 mL via INTRAVENOUS

## 2022-05-03 NOTE — Progress Notes (Signed)
Cardiac CT sent for FFR. 

## 2022-05-04 ENCOUNTER — Encounter: Payer: Self-pay | Admitting: Cardiology

## 2022-05-04 ENCOUNTER — Ambulatory Visit (HOSPITAL_BASED_OUTPATIENT_CLINIC_OR_DEPARTMENT_OTHER)
Admission: RE | Admit: 2022-05-04 | Discharge: 2022-05-04 | Disposition: A | Discharge status: Home / Self Care | Payer: Medicare Other | Source: Ambulatory Visit | Attending: Internal Medicine | Admitting: Internal Medicine

## 2022-05-04 DIAGNOSIS — I2510 Atherosclerotic heart disease of native coronary artery without angina pectoris: Secondary | ICD-10-CM | POA: Insufficient documentation

## 2022-05-04 DIAGNOSIS — R931 Abnormal findings on diagnostic imaging of heart and coronary circulation: Secondary | ICD-10-CM

## 2022-05-04 DIAGNOSIS — I700 Atherosclerosis of aorta: Secondary | ICD-10-CM | POA: Insufficient documentation

## 2022-05-06 ENCOUNTER — Telehealth: Payer: Self-pay | Admitting: Cardiology

## 2022-05-06 ENCOUNTER — Ambulatory Visit: Payer: Medicare Other | Attending: Cardiology

## 2022-05-06 DIAGNOSIS — R55 Syncope and collapse: Secondary | ICD-10-CM | POA: Diagnosis not present

## 2022-05-06 DIAGNOSIS — I2510 Atherosclerotic heart disease of native coronary artery without angina pectoris: Secondary | ICD-10-CM

## 2022-05-06 DIAGNOSIS — Z79899 Other long term (current) drug therapy: Secondary | ICD-10-CM

## 2022-05-06 DIAGNOSIS — J438 Other emphysema: Secondary | ICD-10-CM

## 2022-05-06 NOTE — Telephone Encounter (Signed)
Pt calling for CT results, he states he saw the notification pop up but she could not log in.

## 2022-05-07 NOTE — Telephone Encounter (Signed)
Spoke with pt and advised Coronary CT results per Dr Radford Pax as below.  Provided education on importance of diet and daily activity.  Appointment scheduled for fasting lipids and ALT.  Pt advised to start low dose enteric coated ASA and use Voltaren sparingly due to risk of GI bleed.  Referral placed for Pulmonary.  Pt verbalizes understanding and agrees with current plan.     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  Please refer to pulmonary for evaluation        Coronary CTA showed a coronary calcium score of 879 which was 81st percentile for age and sex matched controls.  50 to 69% proximal RCA, less than 25% proximal and distal RCA, 50 to 69% proximal LAD with normal FFR consistent with no hemodynamic flow-limiting lesions.  He needs aggressive risk factor modification.  Please have him come in for fasting lipid panel and ALT.  He needs to start aspirin 81 mg daily and will try to use Voltaren sparingly as able increased risk of GI bleed.

## 2022-05-07 NOTE — Telephone Encounter (Signed)
Patient states he is returning an additional call from RN.

## 2022-05-08 ENCOUNTER — Ambulatory Visit: Payer: Medicare Other | Attending: Cardiology

## 2022-05-08 DIAGNOSIS — I2510 Atherosclerotic heart disease of native coronary artery without angina pectoris: Secondary | ICD-10-CM

## 2022-05-08 DIAGNOSIS — Z79899 Other long term (current) drug therapy: Secondary | ICD-10-CM

## 2022-05-09 LAB — HEPATIC FUNCTION PANEL
ALT: 6 IU/L (ref 0–44)
AST: 19 IU/L (ref 0–40)
Albumin: 4.4 g/dL (ref 3.8–4.8)
Alkaline Phosphatase: 101 IU/L (ref 44–121)
Bilirubin Total: 0.3 mg/dL (ref 0.0–1.2)
Bilirubin, Direct: <0.1 mg/dL (ref 0.00–0.40)
Total Protein: 7.3 g/dL (ref 6.0–8.5)

## 2022-05-09 LAB — LIPID PANEL
Chol/HDL Ratio: 4.4 ratio (ref 0.0–5.0)
Cholesterol, Total: 203 mg/dL — ABNORMAL HIGH (ref 100–199)
HDL: 46 mg/dL (ref >39)
LDL Chol Calc (NIH): 129 mg/dL — ABNORMAL HIGH (ref 0–99)
Triglycerides: 157 mg/dL — ABNORMAL HIGH (ref 0–149)
VLDL Cholesterol Cal: 28 mg/dL (ref 5–40)

## 2022-05-10 ENCOUNTER — Telehealth: Payer: Self-pay | Admitting: Cardiology

## 2022-05-10 NOTE — Telephone Encounter (Signed)
Patient's son is returning call to discuss lab results.

## 2022-05-10 NOTE — Telephone Encounter (Signed)
Spoke with pt's son Harrie Jeans and advised his name is not listed on pt's DPR to discuss medical information with.  He provided pt's cell phone number of [phone] and RN attempted phone call to that number to obtain permission to speak with pt's son.  Voicemail message left to contact RN at [phone].

## 2022-05-13 ENCOUNTER — Telehealth: Payer: Self-pay | Admitting: Cardiology

## 2022-05-13 DIAGNOSIS — I2510 Atherosclerotic heart disease of native coronary artery without angina pectoris: Secondary | ICD-10-CM

## 2022-05-13 DIAGNOSIS — I700 Atherosclerosis of aorta: Secondary | ICD-10-CM

## 2022-05-13 MED ORDER — ROSUVASTATIN CALCIUM 10 MG PO TABS: 10.0000 mg | ORAL_TABLET | Freq: Every day | ORAL | 3 refills | Status: DC

## 2022-05-13 NOTE — Telephone Encounter (Signed)
Sueanne Margarita, MD 05/09/2022  8:30 AM EST     LDL goal < 70 and TAGs too high.  Start Crestor '10mg'$  daily and repeat FLP and ALT in 6 weeks   The patient has been notified of the result and verbalized understanding.  All questions (if any) were answered. Bernestine Amass, RN 05/13/2022 4:30 PM   Rx has been sent in. Labs have been ordered. Patient will call back to schedule.

## 2022-05-13 NOTE — Telephone Encounter (Signed)
Patient is calling for his lab results. °

## 2022-05-13 NOTE — Telephone Encounter (Signed)
The patient has been notified of the result and verbalized understanding.  All questions (if any) were answered. Bernestine Amass, RN 05/13/2022 4:39 PM

## 2022-05-14 NOTE — Telephone Encounter (Signed)
Left detailed message informing patient that [name] was not in the office today, but would forward call to her to address when she returns.

## 2022-05-14 NOTE — Telephone Encounter (Signed)
Patient stated he would like a call back directly from [employer].

## 2022-05-15 NOTE — Telephone Encounter (Signed)
Patient calling in to review lab (FLP and ALT)results from 05/08/22. Patient states he has not started crestor yet, advised patient to pick up crestor prescription. Also advised that Dr. Radford Pax requested follow up  labs in 6 weeks (repeat FLP and liver panel). Patient defers to schedule at this time but states he will schedule when he comes in for follow up visit with Dr. Radford Pax next week. Patient w/ questions about upcoming Echo. Provided education regarding Echocardiogram (looks at valves and walls of heart, arrive 15 mins early, ok to wear deodorant but not lotion or cologne). Patient verbalizes understanding.

## 2022-05-15 NOTE — Telephone Encounter (Signed)
Patient was calling back. Please advise  

## 2022-05-22 ENCOUNTER — Ambulatory Visit (HOSPITAL_COMMUNITY): Payer: Medicare Other | Attending: Cardiology

## 2022-05-22 DIAGNOSIS — R55 Syncope and collapse: Secondary | ICD-10-CM | POA: Insufficient documentation

## 2022-05-22 LAB — ECHOCARDIOGRAM COMPLETE
Area-P 1/2: 9.25 cm2
S' Lateral: 2.6 cm

## 2022-05-24 ENCOUNTER — Telehealth: Payer: Self-pay

## 2022-05-24 ENCOUNTER — Encounter: Payer: Self-pay | Admitting: Cardiology

## 2022-05-24 ENCOUNTER — Ambulatory Visit: Payer: Medicare Other | Attending: Cardiology | Admitting: Cardiology

## 2022-05-24 VITALS — BP 122/64 | HR 88 | Ht 73.0 in | Wt 101.8 lb

## 2022-05-24 DIAGNOSIS — R55 Syncope and collapse: Secondary | ICD-10-CM | POA: Diagnosis not present

## 2022-05-24 DIAGNOSIS — I2510 Atherosclerotic heart disease of native coronary artery without angina pectoris: Secondary | ICD-10-CM | POA: Diagnosis not present

## 2022-05-24 DIAGNOSIS — E7800 Pure hypercholesterolemia, unspecified: Secondary | ICD-10-CM

## 2022-05-24 NOTE — Progress Notes (Signed)
Cardiology Note    Date:  05/24/2022   ID:  [name], DOB 1950-05-20, MRN [id number]  PCP:  Donnajean Lopes, MD  Cardiologist:  Fransico Him, MD   Chief Complaint  Patient presents with   Coronary Artery Disease   Hyperlipidemia    History of Present Illness:  [name] is a 72 y.o. male with a hx of COPD, depression and anxiety and presyncope.  When I last saw him I prescribed compression hose for orthostatic hypotension and 2D echo was normal.  Am cortisol level was normal.  He was also having CP and a coronary CTA showed a coronary calcium score of 879 which was 81st percentile for age and sex matched controls, 37 to 69% proximal RCA, less than 25% proximal and distal RCA, 50 to 69% proximal LAD with normal FFR 04/2022. Heart monitor showed rare PAC.  He is here today for followup and is doing well. Since I saw him last he has had a few episodes of "blacking out" lasting about 20 seconds and all occurred standing up.  He was wearing his compression hose at that time. He also has been complaining of ongoing chest discomfort that feels like someone has a bag of potatoes sitting on his chest and is nonexertional and not associated with any diaphoresis or nausea and no radiation. He has chronic SOB that is stable and has an appt with Pulmonary in a few days.  He denies any  PND, orthopnea, LE edema, palpitations. He is compliant with his meds and is tolerating meds with no SE.    Past Medical History:  Diagnosis Date   Anxiety    Aortic atherosclerosis (HCC)    Arthritis    CAD (coronary artery disease), native coronary artery    Coronary CTA showed a coronary calcium score of 879 which was 81st percentile for age and sex matched controls.  50 to 69% proximal RCA, less than 25% proximal and distal RCA, 50 to 69% proximal LAD with normal FFR 04/2022   Cataract    COPD (chronic obstructive pulmonary disease) (HCC)    inhaler   Depression    Neuromuscular disorder (HCC)     bulding disc    Past Surgical History:  Procedure Laterality Date   BACK SURGERY     LUMBAR DISC SURGERY Left 06/2003   L4 on L3 hemilaminectomy, foraminotomy for 3-4,4-5 with left L3-L4 extraforaminal diskectomy/notes 08/22/2010    REPAIR DURAL / CSF LEAK  07/2003   Archie Endo 08/22/2010    Current Medications: Current Meds  Medication Sig   acetaminophen-codeine (TYLENOL #3) 300-30 MG tablet Take 1-2 tablets by mouth every 8 (eight) hours as needed.   diclofenac (VOLTAREN) 75 MG EC tablet Take 75 mg by mouth 2 (two) times daily.   DULoxetine (CYMBALTA) 30 MG capsule Take 30 mg by mouth daily.   gabapentin (NEURONTIN) 300 MG capsule Take 300 mg by mouth 3 (three) times daily.   HYDROcodone-acetaminophen (NORCO) 7.5-325 MG tablet Take 1 tablet by mouth every 6 (six) hours as needed for moderate pain or severe pain.   ipratropium-albuterol (DUONEB) 0.5-2.5 (3) MG/3ML SOLN Take 3 mLs by nebulization 4 (four) times daily.   ivabradine (CORLANOR) 7.5 MG TABS tablet Take tablets (23m) TWO hours prior to your cardiac CT scan.   lactose free nutrition (BOOST) LIQD Take 237 mLs by mouth 3 (three) times daily between meals.   levothyroxine (SYNTHROID) 25 MCG tablet Take 25 mcg by mouth 3 (three)  times a week.   metoprolol tartrate (LOPRESSOR) 100 MG tablet Take 1 tablet (100 mg total) by mouth once for 1 dose. Take 90-120 minutes prior to scan.   mirtazapine (REMERON) 30 MG tablet Take 30 mg by mouth at bedtime.   Multiple Vitamins-Minerals (CENTRUM SILVER 50+MEN) TABS Take 1 tablet by mouth daily.   polyethylene glycol (MIRALAX / GLYCOLAX) 17 g packet Take 17 g by mouth daily as needed for mild constipation.   predniSONE (DELTASONE) 20 MG tablet Take 20 mg by mouth daily.   rosuvastatin (CRESTOR) 10 MG tablet Take 1 tablet (10 mg total) by mouth daily.   traZODone (DESYREL) 50 MG tablet Take 25-50 mg by mouth at bedtime as needed for sleep.    Allergies:   Erythromycin base, Mirtazapine, and  Nsaids   Social History   Socioeconomic History   Marital status: Married    Spouse name: Not on file   Number of children: Not on file   Years of education: Not on file   Highest education level: Not on file  Occupational History   Not on file  Tobacco Use   Smoking status: Every Day    Packs/day: 1.50    Years: 43.00    Total pack years: 64.50    Types: Cigarettes   Smokeless tobacco: Never   Tobacco comments:    tried chantix couldn't take it  Vaping Use   Vaping Use: Never used  Substance and Sexual Activity   Alcohol use: Not Currently    Alcohol/week: 6.0 standard drinks of alcohol    Types: 6 Cans of beer per week    Comment: 2 times a week   Drug use: No   Sexual activity: Not Currently  Other Topics Concern   Not on file  Social History Narrative   Right handed   Lives wife in a one story home   Social Determinants of Health   Financial Resource Strain: Not on file  Food Insecurity: Not on file  Transportation Needs: Not on file  Physical Activity: Not on file  Stress: Not on file  Social Connections: Not on file     Family History:  The patient's family history includes Colon cancer in his paternal grandfather; Colon polyps in his father; Diabetes in his brother, father, paternal uncle, and son; Heart disease in his father; Heart failure in his father; Muscular dystrophy in his mother; Stroke in his father.   ROS:   Please see the history of present illness.    ROS All other systems reviewed and are negative.      No data to display             PHYSICAL EXAM:   VS:  BP 122/64   Pulse 88   Ht 6' 1"$  (1.854 m)   Wt 101 lb 12.8 oz (46.2 kg)   SpO2 96%   BMI 13.43 kg/m    No data found.   Orthostatic VS for the past 24 hrs (Last 3 readings):  BP- Sitting Pulse- Sitting BP- Standing at 0 minutes Pulse- Standing at 0 minutes BP- Standing at 3 minutes Pulse- Standing at 3 minutes  05/24/22 1328 121/69 80 114/75 96 120/81 102     GEN: Well  nourished, well developed, in no acute distress  HEENT: normal  Neck: no JVD, carotid bruits, or masses Cardiac: RRR; no murmurs, rubs, or gallops,no edema.  Intact distal pulses bilaterally.  Respiratory:  clear to auscultation bilaterally, normal work of breathing GI: soft,  nontender, nondistended, + BS MS: no deformity or atrophy  Skin: warm and dry, no rash Neuro:  Alert and Oriented x 3, Strength and sensation are intact Psych: euthymic mood, full affect  Wt Readings from Last 3 Encounters:  05/24/22 101 lb 12.8 oz (46.2 kg)  04/24/22 107 lb 3.2 oz (48.6 kg)  01/26/21 97 lb 10.6 oz (44.3 kg)      Studies/Labs Reviewed:   EKG:  EKG is not ordered today.    Recent Labs: 04/25/2022: BUN 23; Creatinine, Ser 0.90; Potassium 4.9; Sodium 136 05/08/2022: ALT 6   Lipid Panel    Component Value Date/Time   CHOL 203 (H) 05/08/2022 1309   TRIG 157 (H) 05/08/2022 1309   HDL 46 05/08/2022 1309   CHOLHDL 4.4 05/08/2022 1309   CHOLHDL 2.9 04/12/2014 1525   VLDL 13 04/12/2014 1525   LDLCALC 129 (H) 05/08/2022 1309    Additional studies/ records that were reviewed today include:  none    ASSESSMENT:    1. Syncope, unspecified syncope type   2. Coronary artery disease involving native coronary artery of native heart, unspecified whether angina present   3. Pure hypercholesterolemia      PLAN:  In order of problems listed above:  Presyncope -it does not sound like he has frank syncope.   -suspect that he is having orthostatic hypotension based on low BP readings today and his symptoms that only occur when standing -2D echo was normal -am cortisol normal -continue compression hose -heart monitor showed rare PAC -coronary CTA showed mild to moderate CAD with normal FFR -he has had a few episodes of brief presyncope or syncope since I saw him last but has not been wearing his compression hose that I ordered him>>he lost the Rx and is not drinking much fluids -encouraged him  to drink 64ox of non caffienated beverages daily -instructed him to get his Rx filled for his compression hose and to put on when he gets up in the am and take off at bedtime daily -he is not orthostatic on exam today so I do not think Midodrine is indicated at this time  2.  ASCAD -Coronary CTA showed a coronary calcium score of 879 which was 81st percentile for age and sex matched controls. 50 to 69% proximal RCA, less than 25% proximal and distal RCA, 50 to 69% proximal LAD with normal FFR 04/2022  -he continues to have problems with chest pressure but it is nonexertional and no associated sx>>suspect it may be related to his underling severe emphysema since coronary FFR was normal -continue ASA and statin  3.  HLD -LDL goal < 70 -continue prescription drug management with Crestor 71m daily with PRN refills -repeat FLP and ALT in 4 weeks  Time Spent: 20 minutes total time of encounter, including 15 minutes spent in face-to-face patient care on the date of this encounter. This time includes coordination of care and counseling regarding above mentioned problem list. Remainder of non-face-to-face time involved reviewing chart documents/testing relevant to the patient encounter and documentation in the medical record. I have independently reviewed documentation from referring provider  Medication Adjustments/Labs and Tests Ordered: Current medicines are reviewed at length with the patient today.  Concerns regarding medicines are outlined above.  Medication changes, Labs and Tests ordered today are listed in the Patient Instructions below.  There are no Patient Instructions on file for this visit.    Signed, TFransico Him MD  05/24/2022 1:14 PM    [facility] [address] [place], [place]  [address] Phone: [phone]; Fax: [phone]

## 2022-05-24 NOTE — Patient Instructions (Signed)
Medication Instructions:  Your physician recommends that you continue on your current medications as directed. Please refer to the Current Medication list given to you today.  *If you need a refill on your cardiac medications before your next appointment, please call your pharmacy*   Lab Work: Please schedule a time to come to our lab when you are FASTING and can complete a Lipid Panel and an ALT level.  If you have labs (blood work) drawn today and your tests are completely normal, you will receive your results only by: [place] (if you have MyChart) OR A paper copy in the mail If you have any lab test that is abnormal or we need to change your treatment, we will call you to review the results.   Testing/Procedures: None.   Follow-Up: At [facility], you and your health needs are our priority.  As part of our continuing mission to provide you with exceptional heart care, we have created designated Provider Care Teams.  These Care Teams include your primary Cardiologist (physician) and Advanced Practice Providers (APPs -  Physician Assistants and Nurse Practitioners) who all work together to provide you with the care you need, when you need it.  We recommend signing up for the patient portal called "MyChart".  Sign up information is provided on this After Visit Summary.  MyChart is used to connect with patients for Virtual Visits (Telemedicine).  Patients are able to view lab/test results, encounter notes, upcoming appointments, etc.  Non-urgent messages can be sent to your provider as well.   To learn more about what you can do with MyChart, go to [email or web address].    Your next appointment:   6 week(s)  Provider:   With Christen Bame, NP, Richardson Dopp, PA or Nicholes Rough, [place]  Other Instructions Please drink 64 ounces daily of non-caffeinated beverages daily.   Please use the written prescription to obtain compression hose.

## 2022-05-24 NOTE — Telephone Encounter (Signed)
-----   Message from Sueanne Margarita, MD sent at 05/23/2022  9:04 AM EST ----- The echo showed normal heart function with EF 55 to 60% with increased stiffness of the heart called diastolic dysfunction and otherwise mildly calcified aortic valve with no AS.

## 2022-05-24 NOTE — Telephone Encounter (Signed)
Called and left message per DPR that lab appt was supposed to be scheduled for 4 weeks, not 1 week. Instructed patient I had moved his lab appointment to  mid March (June 27, 2022) and that he could call back and schedule it for a better day if he had a conflict.

## 2022-05-24 NOTE — Telephone Encounter (Signed)
Echo and event monitor results reviewed with patient at in-person appointment with Dr. Radford Pax.

## 2022-05-28 ENCOUNTER — Other Ambulatory Visit (HOSPITAL_COMMUNITY): Payer: Self-pay | Admitting: Internal Medicine

## 2022-05-28 DIAGNOSIS — R1314 Dysphagia, pharyngoesophageal phase: Secondary | ICD-10-CM

## 2022-05-31 ENCOUNTER — Other Ambulatory Visit: Payer: Medicare Other

## 2022-06-02 NOTE — Progress Notes (Unsigned)
Synopsis: Referred for dyspnea by Sueanne Margarita, MD  Subjective:   PATIENT ID: [name] GENDER: male DOB: [birth year], MRN: [id number]  Chief Complaint  Patient presents with   Pulmonary Consult    Referred by Dr. Fransico Him. Pt c/o SOB for the past 3 years. He gets winded just walking from room to room. He has cough with yellow sputum.    Winded with minimal activity worsening over the last several years. He has cough with some phlegm. He has had two courses of prednisone which help when he gets them. Does duonebs 3 times daily typically.   Has tried wixela, advair.   Mentions issues with 'heeby Olevia Perches' where when he urinates he trembles and doesn't quite lose consciousness but then falls down.   Has lost close to 40 lb since 2020.   Does have some solid food dysphagia - especially rice.   Otherwise pertinent review of systems is negative.  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 No MJ, vaping  Past Medical History:  Diagnosis Date   Anxiety    Aortic atherosclerosis (HCC)    Arthritis    CAD (coronary artery disease), native coronary artery    Coronary CTA showed a coronary calcium score of 879 which was 81st percentile for age and sex matched controls.  50 to 69% proximal RCA, less than 25% proximal and distal RCA, 50 to 69% proximal LAD with normal FFR 04/2022   Cataract    COPD (chronic obstructive pulmonary disease) (HCC)    inhaler   Depression    Neuromuscular disorder (HCC)    bulding disc     Family History  Problem Relation Age of Onset   Diabetes Father    Colon polyps Father    Heart disease Father    Stroke Father    Heart failure Father    Diabetes Brother    Muscular dystrophy Mother    Diabetes Paternal Uncle    Colon cancer Paternal Grandfather    Diabetes Son       Past Surgical History:  Procedure Laterality Date   BACK SURGERY     LUMBAR [place] SURGERY Left 06/2003   L4 on L3 hemilaminectomy, foraminotomy for 3-4,4-5 with left L3-L4 extraforaminal diskectomy/notes 08/22/2010    REPAIR DURAL / CSF LEAK  07/2003   Archie Endo 08/22/2010    Social History   Socioeconomic History   Marital status: Married    Spouse name: Not on file   Number of children: Not on file   Years of education: Not on file   Highest education level: Not on file  Occupational History   Not on file  Tobacco Use   Smoking status: Every Day    Packs/day: 2.00    Years: 51.00    Total pack years: 102.00    Types: Cigarettes   Smokeless tobacco: Never  Vaping Use   Vaping Use: Never used  Substance and Sexual Activity   Alcohol use: Not Currently    Alcohol/week: 6.0 standard drinks of alcohol    Types: 6 Cans of beer per week    Comment: 2 times a week   Drug use: No   Sexual activity: Not Currently  Other Topics Concern   Not on file  Social History Narrative  Right handed   Lives wife in a one story home   Social Determinants of Health   Financial Resource Strain: Not on file  Food Insecurity: Not on file  Transportation Needs: Not on file  Physical Activity: Not on file  Stress: Not on file  Social Connections: Not on file  Intimate Partner Violence: Not on file     Allergies  Allergen Reactions   Erythromycin Base Other (See Comments)    hallucinations   Mirtazapine Other (See Comments)   Nsaids Other (See Comments)     Outpatient Medications Prior to Visit  Medication Sig Dispense Refill   diclofenac (VOLTAREN) 75 MG EC tablet Take 75 mg by mouth 2 (two) times daily.     DULoxetine (CYMBALTA) 30 MG capsule Take 30 mg by mouth daily.     gabapentin (NEURONTIN) 300 MG capsule Take 300 mg by mouth 3 (three) times daily.     HYDROcodone-acetaminophen (NORCO) 7.5-325 MG tablet Take 1 tablet by mouth every 6 (six) hours as needed for moderate  pain or severe pain.     ipratropium-albuterol (DUONEB) 0.5-2.5 (3) MG/3ML SOLN Take 3 mLs by nebulization 4 (four) times daily.     lactose free nutrition (BOOST) LIQD Take 237 mLs by mouth 3 (three) times daily between meals.     levothyroxine (SYNTHROID) 25 MCG tablet Take 25 mcg by mouth 3 (three) times a week.     mirtazapine (REMERON) 30 MG tablet Take 30 mg by mouth at bedtime.     Multiple Vitamins-Minerals (CENTRUM SILVER 50+MEN) TABS Take 1 tablet by mouth daily.     rosuvastatin (CRESTOR) 10 MG tablet Take 1 tablet (10 mg total) by mouth daily. 90 tablet 3   traZODone (DESYREL) 50 MG tablet Take 25-50 mg by mouth at bedtime as needed for sleep.     acetaminophen-codeine (TYLENOL #3) 300-30 MG tablet Take 1-2 tablets by mouth every 8 (eight) hours as needed. 30 tablet 0   ivabradine (CORLANOR) 7.5 MG TABS tablet Take tablets ('15mg'$ ) TWO hours prior to your cardiac CT scan. 2 tablet 0   metoprolol tartrate (LOPRESSOR) 100 MG tablet Take 1 tablet (100 mg total) by mouth once for 1 dose. Take 90-120 minutes prior to scan. 1 tablet 0   polyethylene glycol (MIRALAX / GLYCOLAX) 17 g packet Take 17 g by mouth daily as needed for mild constipation. 14 each 0   predniSONE (DELTASONE) 20 MG tablet Take 20 mg by mouth daily.     No facility-administered medications prior to visit.       Objective:   Physical Exam:  General appearance: 72 y.o., male, cachectic/thin Eyes: anicteric sclerae; PERRL, tracking appropriately HENT: NCAT; MMM Neck: Trachea midline; no lymphadenopathy, no JVD Lungs: diminished bl, with normal respiratory effort CV: RRR, no murmur  Abdomen: Soft, non-tender; non-distended, BS present  Extremities: No peripheral edema, warm Skin: Normal turgor and texture; no rash Psych: Appropriate affect Neuro: Alert and oriented to person and place, no focal deficit     Vitals:   06/03/22 1533  BP: 90/60  Pulse: 94  Temp: 98.5 F (36.9 C)  TempSrc: Oral  SpO2: 98%   Weight: 102 lb (46.3 kg)  Height: 6' (1.829 m)   98% on RA BMI Readings from Last 3 Encounters:  06/03/22 13.83 kg/m  05/24/22 13.43 kg/m  04/24/22 14.14 kg/m   Wt Readings from Last 3 Encounters:  06/03/22 102 lb (46.3 kg)  05/24/22 101 lb 12.8 oz (46.2 kg)  04/24/22 107  lb 3.2 oz (48.6 kg)     CBC    Component Value Date/Time   WBC 5.8 01/25/2021 0547   RBC 3.68 (L) 01/25/2021 0547   HGB 11.3 (L) 01/25/2021 0547   HCT 34.4 (L) 01/25/2021 0547   PLT 272 01/25/2021 0547   MCV 93.5 01/25/2021 0547   MCH 30.7 01/25/2021 0547   MCHC 32.8 01/25/2021 0547   RDW 13.6 01/25/2021 0547   LYMPHSABS 2.3 01/21/2021 1632   MONOABS 0.8 01/21/2021 1632   EOSABS 0.2 01/21/2021 1632   BASOSABS 0.1 01/21/2021 1632      Chest Imaging: CT Coronaries 05/03/22 with advanced emphysema, some bullae, apical RLL predominant scarring  CTA Chest 01/31/21 with 86m LLL nodule (not seen in 2020 - present since 09/2020), bilateral upper lobe, RLL scarring and advanced emphysema  PET/CT 12/2020: LEFT pulmonary nodule with uptake less than cardiac blood pool on  the current study. Findings suggest indolent process. Indolent  bronchogenic neoplasm remains a differential consideration.   RIGHT lower lobe pneumonia. Material in RIGHT lower lobe bronchi  suggest aspiration, associated with small to moderate RIGHT-sided  pleural effusion.   Pulmonary Functions Testing Results:     No data to display            Echocardiogram:    1. Left ventricular ejection fraction, by estimation, is 55 to 60%. The  left ventricle has normal function. The left ventricle has no regional  wall motion abnormalities. Left ventricular diastolic parameters are  consistent with Grade I diastolic  dysfunction (impaired relaxation).   2. Right ventricular systolic function is normal. The right ventricular  size is normal.   3. The mitral valve is normal in structure. No evidence of mitral valve   regurgitation. No evidence of mitral stenosis.   4. The aortic valve is tricuspid. There is mild calcification of the  aortic valve. Aortic valve regurgitation is not visualized. No aortic  stenosis is present.   5. The inferior vena cava is normal in size with greater than 50%  respiratory variability, suggesting right atrial pressure of 3 mmHg.      Assessment & Plan:   # DOE # advanced emphysema, bullae  # Pulmonary nodules # smoking  Dominant nodule 1.2cm LLL not pet avid 12/2020.   Plan: - try breztri 2 puffs twice daily rinse mouth and brush tongue/teeth after each use - can use duoneb as needed - if you have issues with trouble urinating or blurry vision after starting breztri  - try your best to stop smoking - trying to cut back on his own - you'll be called for CT Chest  - if all stable on this one then can resume lung cancer screening protocol - see you in 3 months or sooner if need be with breathing tests (PFTs)     NMaryjane Hurter MD [facility] 06/03/2022 3:48 PM

## 2022-06-03 ENCOUNTER — Ambulatory Visit: Payer: Medicare Other | Admitting: Student

## 2022-06-03 ENCOUNTER — Encounter: Payer: Self-pay | Admitting: Student

## 2022-06-03 VITALS — BP 90/60 | HR 94 | Temp 98.5°F | Ht 72.0 in | Wt 102.0 lb

## 2022-06-03 DIAGNOSIS — R0609 Other forms of dyspnea: Secondary | ICD-10-CM

## 2022-06-03 DIAGNOSIS — F17200 Nicotine dependence, unspecified, uncomplicated: Secondary | ICD-10-CM | POA: Diagnosis not present

## 2022-06-03 DIAGNOSIS — R918 Other nonspecific abnormal finding of lung field: Secondary | ICD-10-CM | POA: Diagnosis not present

## 2022-06-03 NOTE — Patient Instructions (Addendum)
-   try breztri 2 puffs twice daily rinse mouth and brush tongue/teeth after each use - can use duoneb as needed - if you have issues with trouble urinating or blurry vision after starting breztri  - try your best to stop smoking - you'll be called for CT Chest  - see you in 3 months or sooner if need be with breathing tests (PFTs)

## 2022-06-04 ENCOUNTER — Encounter (HOSPITAL_COMMUNITY): Payer: Self-pay

## 2022-06-04 ENCOUNTER — Ambulatory Visit (HOSPITAL_COMMUNITY): Payer: Medicare Other

## 2022-06-07 ENCOUNTER — Telehealth: Payer: Self-pay | Admitting: Student

## 2022-06-07 NOTE — Telephone Encounter (Signed)
Called patient but he did not answer. Left message for him to call back.  

## 2022-06-07 NOTE — Telephone Encounter (Signed)
Patient would like the nurse to call regarding his medication for [facility]. Please call patient to discuss further.  [phone]

## 2022-06-12 ENCOUNTER — Ambulatory Visit (HOSPITAL_COMMUNITY): Payer: Medicare Other

## 2022-06-12 NOTE — Telephone Encounter (Signed)
Called patient again. He did not answer. Left message for him to call us back. Will close encounter since this was the 2nd attempt.

## 2022-06-13 ENCOUNTER — Encounter: Payer: Self-pay | Admitting: Radiology

## 2022-06-17 ENCOUNTER — Ambulatory Visit (HOSPITAL_COMMUNITY): Admission: RE | Admit: 2022-06-17 | Payer: Medicare Other | Source: Ambulatory Visit

## 2022-06-23 DIAGNOSIS — R627 Adult failure to thrive: Secondary | ICD-10-CM | POA: Diagnosis not present

## 2022-06-23 DIAGNOSIS — I951 Orthostatic hypotension: Secondary | ICD-10-CM | POA: Diagnosis not present

## 2022-06-27 ENCOUNTER — Ambulatory Visit: Payer: Medicare Other

## 2022-07-04 ENCOUNTER — Ambulatory Visit (HOSPITAL_COMMUNITY)
Admission: RE | Admit: 2022-07-04 | Discharge: 2022-07-04 | Disposition: A | Discharge status: Home / Self Care | Payer: Medicare Other | Source: Ambulatory Visit | Attending: Internal Medicine | Admitting: Internal Medicine

## 2022-07-04 ENCOUNTER — Ambulatory Visit (HOSPITAL_COMMUNITY)
Admission: RE | Admit: 2022-07-04 | Discharge: 2022-07-04 | Disposition: A | Discharge status: Home / Self Care | Payer: Medicare Other | Source: Ambulatory Visit | Attending: Student | Admitting: Student

## 2022-07-04 DIAGNOSIS — R918 Other nonspecific abnormal finding of lung field: Secondary | ICD-10-CM

## 2022-07-04 DIAGNOSIS — R1314 Dysphagia, pharyngoesophageal phase: Secondary | ICD-10-CM

## 2022-07-08 ENCOUNTER — Ambulatory Visit: Payer: Medicare Other | Admitting: Physician Assistant

## 2022-07-10 ENCOUNTER — Ambulatory Visit: Payer: Medicare Other | Attending: Internal Medicine

## 2022-07-10 ENCOUNTER — Other Ambulatory Visit: Payer: Self-pay

## 2022-07-10 DIAGNOSIS — E7800 Pure hypercholesterolemia, unspecified: Secondary | ICD-10-CM

## 2022-07-11 ENCOUNTER — Telehealth: Payer: Self-pay

## 2022-07-11 LAB — LIPID PANEL
Chol/HDL Ratio: 2.7 ratio (ref 0.0–5.0)
Cholesterol, Total: 166 mg/dL (ref 100–199)
HDL: 62 mg/dL (ref >39)
LDL Chol Calc (NIH): 86 mg/dL (ref 0–99)
Triglycerides: 99 mg/dL (ref 0–149)
VLDL Cholesterol Cal: 18 mg/dL (ref 5–40)

## 2022-07-11 LAB — ALT: ALT: 9 IU/L (ref 0–44)

## 2022-07-11 NOTE — Progress Notes (Deleted)
Office Visit    Patient Name: [name] Date of Encounter: 07/11/2022  Primary Care Provider:  Donnajean Lopes, MD Primary Cardiologist:  None Primary Electrophysiologist: None  Chief Complaint    [name] is a 72 y.o. male with PMH of CAD (CTA showed calcium score of 879), orthostatic hypotension aortic atherosclerosis, COPD, presyncope who presents today for 6-week follow-up for hypotension.  Past Medical History    Past Medical History:  Diagnosis Date   Anxiety    Aortic atherosclerosis (HCC)    Arthritis    CAD (coronary artery disease), native coronary artery    Coronary CTA showed a coronary calcium score of 879 which was 81st percentile for age and sex matched controls.  50 to 69% proximal RCA, less than 25% proximal and distal RCA, 50 to 69% proximal LAD with normal FFR 04/2022   Cataract    COPD (chronic obstructive pulmonary disease) (HCC)    inhaler   Depression    Neuromuscular disorder (HCC)    bulding disc   Past Surgical History:  Procedure Laterality Date   BACK SURGERY     LUMBAR DISC SURGERY Left 06/2003   L4 on L3 hemilaminectomy, foraminotomy for 3-4,4-5 with left L3-L4 extraforaminal diskectomy/notes 08/22/2010    REPAIR DURAL / CSF LEAK  07/2003   Archie Endo 08/22/2010    Allergies  Allergies  Allergen Reactions   Erythromycin Base Other (See Comments)    hallucinations   Mirtazapine Other (See Comments)   Nsaids Other (See Comments)    History of Present Illness    [name]  is a 72 year old male with the above mention past medical history who presents today for 6-week follow-up of orthostatic hypotension.  He was initially seen by Dr. Radford Pax on 04/24/2022 for complaint of syncope by his PCP.  He reported 3 episodes that occurred and had loss of consciousness.  He reported these occur when he stands from a sitting position.  He also reports palpitations prior to these events.  He also reported some DOE.  Orthostatic BPs were  obtained that were positive.  He was given a prescription for compression hose.  2D echo was completed that revealed EF of 55 to 60% with grade 1 DD and no evidence of aortic stenosis with mild calcification.  Coronary CTA was also completed and showed calcium score of 879 with mild to moderate mixed CAD with aortic atherosclerosis.  He wore a ZIO monitor that showed predominantly sinus rhythm with rare PACs.  He was last seen 05/24/2022 for follow-up visit.  He reported ongoing presyncope and chest discomfort.  He also admitted to not wearing his compression hose is not maintaining good fluid hydration.  He was not orthostatic on exam and midodrine was not indicated.  Chest pressure was also felt to be possibly related to underlying emphysema.  Since last being seen in the office patient reports***.  Patient denies chest pain, palpitations, dyspnea, PND, orthopnea, nausea, vomiting, dizziness, syncope, edema, weight gain, or early satiety.     ***Notes:  Home Medications    Current Outpatient Medications  Medication Sig Dispense Refill   buPROPion (WELLBUTRIN XL) 150 MG 24 hr tablet Take by mouth.     busPIRone (BUSPAR) 5 MG tablet Take by mouth.     clonazePAM (KLONOPIN) 1 MG tablet      ferrous sulfate 325 (65 FE) MG tablet Take by mouth.     fluticasone-salmeterol (ADVAIR HFA) 230-21 MCG/ACT inhaler Inhale into  the lungs.     fluticasone-salmeterol (WIXELA INHUB) 500-50 MCG/ACT AEPB      ibuprofen (ADVIL) 800 MG tablet      nicotine (NICODERM CQ - DOSED IN MG/24 HOURS) 21 mg/24hr patch Place onto the skin.     polyethylene glycol powder (GLYCOLAX/MIRALAX) 17 GM/SCOOP powder Take by mouth.     traMADol (ULTRAM) 50 MG tablet      diclofenac (VOLTAREN) 75 MG EC tablet Take 75 mg by mouth 2 (two) times daily.     DULoxetine (CYMBALTA) 30 MG capsule Take 30 mg by mouth daily.     gabapentin (NEURONTIN) 300 MG capsule Take 300 mg by mouth 3 (three) times daily.     HYDROcodone-acetaminophen  (NORCO) 7.5-325 MG tablet Take 1 tablet by mouth every 6 (six) hours as needed for moderate pain or severe pain.     ipratropium-albuterol (DUONEB) 0.5-2.5 (3) MG/3ML SOLN Take 3 mLs by nebulization 4 (four) times daily.     lactose free nutrition (BOOST) LIQD Take 237 mLs by mouth 3 (three) times daily between meals.     levothyroxine (SYNTHROID) 25 MCG tablet Take 25 mcg by mouth 3 (three) times a week.     mirtazapine (REMERON) 30 MG tablet Take 30 mg by mouth at bedtime.     Multiple Vitamins-Minerals (CENTRUM SILVER 50+MEN) TABS Take 1 tablet by mouth daily.     rosuvastatin (CRESTOR) 10 MG tablet Take 1 tablet (10 mg total) by mouth daily. 90 tablet 3   traZODone (DESYREL) 50 MG tablet Take 25-50 mg by mouth at bedtime as needed for sleep.     No current facility-administered medications for this visit.     Review of Systems  Please see the history of present illness.    (+)*** (+)***  All other systems reviewed and are otherwise negative except as noted above.  Physical Exam    Wt Readings from Last 3 Encounters:  06/03/22 102 lb (46.3 kg)  05/24/22 101 lb 12.8 oz (46.2 kg)  04/24/22 107 lb 3.2 oz (48.6 kg)   [id number] were no vitals filed for this visit.,There is no height or weight on file to calculate BMI.  Constitutional:      Appearance: Healthy appearance. Not in distress.  Neck:     Vascular: JVD normal.  Pulmonary:     Effort: Pulmonary effort is normal.     Breath sounds: No wheezing. No rales. Diminished in the bases Cardiovascular:     Normal rate. Regular rhythm. Normal S1. Normal S2.      Murmurs: There is no murmur.  Edema:    Peripheral edema absent.  Abdominal:     Palpations: Abdomen is soft non tender. There is no hepatomegaly.  Skin:    General: Skin is warm and dry.  Neurological:     General: No focal deficit present.     Mental Status: Alert and oriented to person, place and time.     Cranial Nerves: Cranial nerves are intact.  EKG/LABS/  Recent Cardiac Studies    ECG personally reviewed by me today - ***  Risk Assessment/Calculations:   {Does this patient have ATRIAL FIBRILLATION?:[phone]}        Lab Results  Component Value Date   WBC 5.8 01/25/2021   HGB 11.3 (L) 01/25/2021   HCT 34.4 (L) 01/25/2021   MCV 93.5 01/25/2021   PLT 272 01/25/2021   Lab Results  Component Value Date   CREATININE 0.90 04/25/2022   BUN 23 04/25/2022  NA 136 04/25/2022   K 4.9 04/25/2022   CL 99 04/25/2022   CO2 22 04/25/2022   Lab Results  Component Value Date   ALT 9 07/10/2022   AST 19 05/08/2022   ALKPHOS 101 05/08/2022   BILITOT 0.3 05/08/2022   Lab Results  Component Value Date   CHOL 166 07/10/2022   HDL 62 07/10/2022   LDLCALC 86 07/10/2022   TRIG 99 07/10/2022   CHOLHDL 2.7 07/10/2022    No results found for: "HGBA1C"  Cardiac Studies & Procedures       ECHOCARDIOGRAM  ECHOCARDIOGRAM COMPLETE 05/22/2022  Narrative ECHOCARDIOGRAM REPORT    Patient Name:   [name] Date of Exam: 05/22/2022 Medical Rec #:  [id number]      Height:       73.0 in Accession #:    [id number]     Weight:       107.2 lb Date of Birth:  [birth year]     BSA:          1.651 m Patient Age:    25 years       BP:           120/77 mmHg Patient Gender: M              HR:           82 bpm. Exam Location:  [place]  Procedure: 2D Echo, Cardiac Doppler and Color Doppler  Indications:    R55 Syncope  History:        Patient has prior history of Echocardiogram examinations, most recent 01/17/2021. CAD, COPD; Risk Factors:Current Smoker. Other chest pain.  Sonographer:    Diamond Nickel RCS Referring Phys: Eber Hong TURNER  IMPRESSIONS   1. Left ventricular ejection fraction, by estimation, is 55 to 60%. The left ventricle has normal function. The left ventricle has no regional wall motion abnormalities. Left ventricular diastolic parameters are consistent with Grade I diastolic dysfunction (impaired relaxation). 2.  Right ventricular systolic function is normal. The right ventricular size is normal. 3. The mitral valve is normal in structure. No evidence of mitral valve regurgitation. No evidence of mitral stenosis. 4. The aortic valve is tricuspid. There is mild calcification of the aortic valve. Aortic valve regurgitation is not visualized. No aortic stenosis is present. 5. The inferior vena cava is normal in size with greater than 50% respiratory variability, suggesting right atrial pressure of 3 mmHg.  FINDINGS Left Ventricle: Left ventricular ejection fraction, by estimation, is 55 to 60%. The left ventricle has normal function. The left ventricle has no regional wall motion abnormalities. The left ventricular internal cavity size was normal in size. There is no left ventricular hypertrophy. Left ventricular diastolic parameters are consistent with Grade I diastolic dysfunction (impaired relaxation).  Right Ventricle: The right ventricular size is normal. No increase in right ventricular wall thickness. Right ventricular systolic function is normal.  Left Atrium: Left atrial size was normal in size.  Right Atrium: Right atrial size was normal in size.  Pericardium: There is no evidence of pericardial effusion. The pericardial effusion is anterior to the right ventricle.  Mitral Valve: The mitral valve is normal in structure. Mild mitral annular calcification. No evidence of mitral valve regurgitation. No evidence of mitral valve stenosis.  Tricuspid Valve: The tricuspid valve is normal in structure. Tricuspid valve regurgitation is trivial. No evidence of tricuspid stenosis.  Aortic Valve: The aortic valve is tricuspid. There is mild calcification of the aortic valve. Aortic  valve regurgitation is not visualized. No aortic stenosis is present.  Pulmonic Valve: The pulmonic valve was normal in structure. Pulmonic valve regurgitation is not visualized. No evidence of pulmonic stenosis.  Aorta: The  aortic root is normal in size and structure.  Venous: The inferior vena cava is normal in size with greater than 50% respiratory variability, suggesting right atrial pressure of 3 mmHg.  IAS/Shunts: No atrial level shunt detected by color flow Doppler.   LEFT VENTRICLE PLAX 2D LVIDd:         3.40 cm   Diastology LVIDs:         2.60 cm   LV e' medial:    10.60 cm/s LV PW:         0.70 cm   LV E/e' medial:  7.2 LV IVS:        0.90 cm   LV e' lateral:   8.81 cm/s LVOT diam:     2.00 cm   LV E/e' lateral: 8.6 LV SV:         52 LV SV Index:   32 LVOT Area:     3.14 cm   RIGHT VENTRICLE RV Basal diam:  2.50 cm RV S prime:     15.30 cm/s TAPSE (M-mode): 1.6 cm RVSP:           16.7 mmHg  LEFT ATRIUM           Index       RIGHT ATRIUM           Index LA diam:      1.50 cm 0.91 cm/m  RA Pressure: 3.00 mmHg LA Vol (A4C): 11.9 ml 7.21 ml/m  RA Area:     7.71 cm RA Volume:   11.90 ml  7.21 ml/m AORTIC VALVE LVOT Vmax:   85.40 cm/s LVOT Vmean:  57.500 cm/s LVOT VTI:    0.167 m  AORTA Ao Root diam: 3.00 cm  MITRAL VALVE               TRICUSPID VALVE MV Area (PHT): 9.25 cm    TR Peak grad:   13.7 mmHg MV Decel Time: 82 msec     TR Vmax:        185.00 cm/s MV E velocity: 76.00 cm/s  Estimated RAP:  3.00 mmHg MV A velocity: 88.00 cm/s  RVSP:           16.7 mmHg MV E/A ratio:  0.86 SHUNTS Systemic VTI:  0.17 m Systemic Diam: 2.00 cm  Glori Bickers MD Electronically signed by Glori Bickers MD Signature Date/Time: 05/22/2022/6:28:08 PM    Final    MONITORS  CARDIAC EVENT MONITOR 05/23/2022  Narrative   Predominant rhythm was normal sinus rhythm with an average heart rate of 78 bpm and ranged from 83-1 37 beats per.   Rare PAC   CT SCANS  CT CORONARY MORPH W/CTA COR W/SCORE 05/06/2022  Addendum 05/06/2022  9:45 AM ADDENDUM REPORT: 05/06/2022 09:43  EXAM: OVER-READ INTERPRETATION  CT CHEST  The following report is an over-read performed by radiologist  Dr. Collene Leyden [facility] on 05/06/2022. This over-read does not include interpretation of cardiac or coronary anatomy or pathology. The coronary CTA interpretation by the cardiologist is attached.  COMPARISON:  02/06/2021  FINDINGS: Heart is normal size. Aorta normal caliber. No adenopathy. Emphysema. Scarring bilaterally, most pronounced in the right lung. No effusions. Chest wall soft tissues are unremarkable. No acute bony abnormality.  IMPRESSION: No acute extra cardiac  abnormality.  Severe emphysema. Areas of scarring bilaterally, right greater than left.   Electronically Signed By: Rolm Baptise M.D. On: 05/06/2022 09:43  Narrative HISTORY: 72 yo male with chest pain, nonspecific  EXAM: Cardiac/Coronary CTA  TECHNIQUE: The patient was scanned on a [employer].  PROTOCOL: A 120 kV prospective scan was triggered in the descending thoracic aorta at 111 HU's. Axial non-contrast 3 mm slices were carried out through the heart. The data set was analyzed on a dedicated work station and scored using the [place]. Gantry rotation speed was 250 msecs and collimation was .6 mm. Beta blockade and 0.8 mg of sl NTG was given. The 3D data set was reconstructed in 5% intervals of the 35-75 % of the R-R cycle. Diastolic phases were analyzed on a dedicated work station using MPR, MIP and VRT modes. The patient received 100 ml Omnipaque of contrast.  FINDINGS: Quality: Good, HR 60  Coronary calcium score: The patient's coronary artery calcium score is 879, which places the patient in the 81st percentile.  Coronary arteries: Normal coronary origins.  Right dominance.  Right Coronary Artery: Dominant. Moderate non-calcified proximal 50-69% stenosis. Otherwise, minimal 1-24% mixed proximal and distal vessel stenosis.  Left Main Coronary Artery: Normal. Bifurcates into the LAD and LCx arteries.  Left Anterior Descending Coronary Artery: Large  anterior artery that wraps around the apex. There is heavy proximal calcification with probably mild to moderate stenosis (50-69%) and significant blooming artifact. Several diagonal branches of varying sizes, no disease.  Left Circumflex Artery: Lateral vessel, no disease. Small OM branches without disease.  Aorta: Normal size, 30 mm at the mid ascending aorta (level of the PA bifurcation) measured double oblique. Aortic atherosclerosis. No dissection.  Aortic Valve: Trileaflet. No calcifications.  Other findings:  Normal pulmonary vein drainage into the left atrium.  Normal left atrial appendage without a thrombus.  Normal size of the pulmonary artery.  IMPRESSION: 1. Mild to moderate mixed CAD, CADRADS = 3. CT FFR will be performed and reported separately.  2. Coronary calcium score of 879. This was 81st percentile for age and sex matched control.  3. Normal coronary origin with right dominance.  4. Aortic atherosclerosis.  Electronically Signed: By: Pixie Casino M.D. On: 05/03/2022 14:35          Assessment & Plan    1.  Presyncope: -Patient had experienced previous episodes of blackout spells.  Today patient reports***   2.  Nonobstructive CAD: -Previous cardiac CTA showed mild to moderate CAD with normal FFR -Today patient reports***  3.  Orthostasis: -Patient's blood pressure today was*** -  4.  Hyperlipidemia: -Patient's LDL cholesterol was*** -Continue***      Disposition: Follow-up with None or APP in *** months {Are you ordering a CV Procedure (e.g. stress test, cath, DCCV, TEE, etc)?   Press F2        :[id number]   Medication Adjustments/Labs and Tests Ordered: Current medicines are reviewed at length with the patient today.  Concerns regarding medicines are outlined above.   Signed, Mable Fill, Marissa Nestle, NP 07/11/2022, 9:13 AM [facility]  Note:  This document was prepared using Dragon voice recognition  software and may include unintentional dictation errors.

## 2022-07-11 NOTE — Telephone Encounter (Signed)
-----   Message from Sueanne Margarita, MD sent at 07/11/2022 11:08 AM EDT ----- LDL not at goal of less than 70 increase Crestor to 20 mg daily and repeat FLP and ALT in 6 weeks

## 2022-07-11 NOTE — Telephone Encounter (Signed)
Called to discuss results of labs with patient, no answer, left detailed message per [facility] regarding lab results and need to increase crestor and repeat labs. Asked patient to call office to get these changes and follow labs arranged.

## 2022-07-12 ENCOUNTER — Ambulatory Visit: Payer: Medicare Other | Attending: Nurse Practitioner | Admitting: Nurse Practitioner

## 2022-07-12 DIAGNOSIS — I2510 Atherosclerotic heart disease of native coronary artery without angina pectoris: Secondary | ICD-10-CM

## 2022-07-12 DIAGNOSIS — I951 Orthostatic hypotension: Secondary | ICD-10-CM

## 2022-07-12 DIAGNOSIS — R42 Dizziness and giddiness: Secondary | ICD-10-CM

## 2022-07-12 DIAGNOSIS — I700 Atherosclerosis of aorta: Secondary | ICD-10-CM

## 2022-07-12 DIAGNOSIS — E782 Mixed hyperlipidemia: Secondary | ICD-10-CM

## 2022-07-15 ENCOUNTER — Encounter: Payer: Self-pay | Admitting: Nurse Practitioner

## 2022-07-18 ENCOUNTER — Telehealth: Payer: Self-pay

## 2022-07-18 DIAGNOSIS — Z79899 Other long term (current) drug therapy: Secondary | ICD-10-CM

## 2022-07-18 DIAGNOSIS — E782 Mixed hyperlipidemia: Secondary | ICD-10-CM

## 2022-07-18 MED ORDER — ROSUVASTATIN CALCIUM 20 MG PO TABS: 20.0000 mg | ORAL_TABLET | Freq: Every day | ORAL | 3 refills | Status: AC

## 2022-07-18 NOTE — Telephone Encounter (Signed)
-----   Message from Traci R Turner, MD sent at 07/11/2022 11:08 AM EDT ----- LDL not at goal of less than 70 increase Crestor to 20 mg daily and repeat FLP and ALT in 6 weeks 

## 2022-07-18 NOTE — Telephone Encounter (Signed)
Per Dr Mayford Knife, called patient to discuss that LDL not at goal of less than 70. Dr. Mayford Knife advises to increase Crestor to 20 mg daily and repeat FLP and ALT in 6 weeks. Patient verbalizes understanding, orders placed and labs scheduled.

## 2022-07-23 IMAGING — CR DG CHEST 2V
2 series · 2 of 2 positions shown · non-contrast
Comparison: 12/19/2020 chest x-ray and chest CT.

CLINICAL DATA: Chest pain, difficulty breathing

EXAM:
CHEST - 2 VIEW

[chest pa]
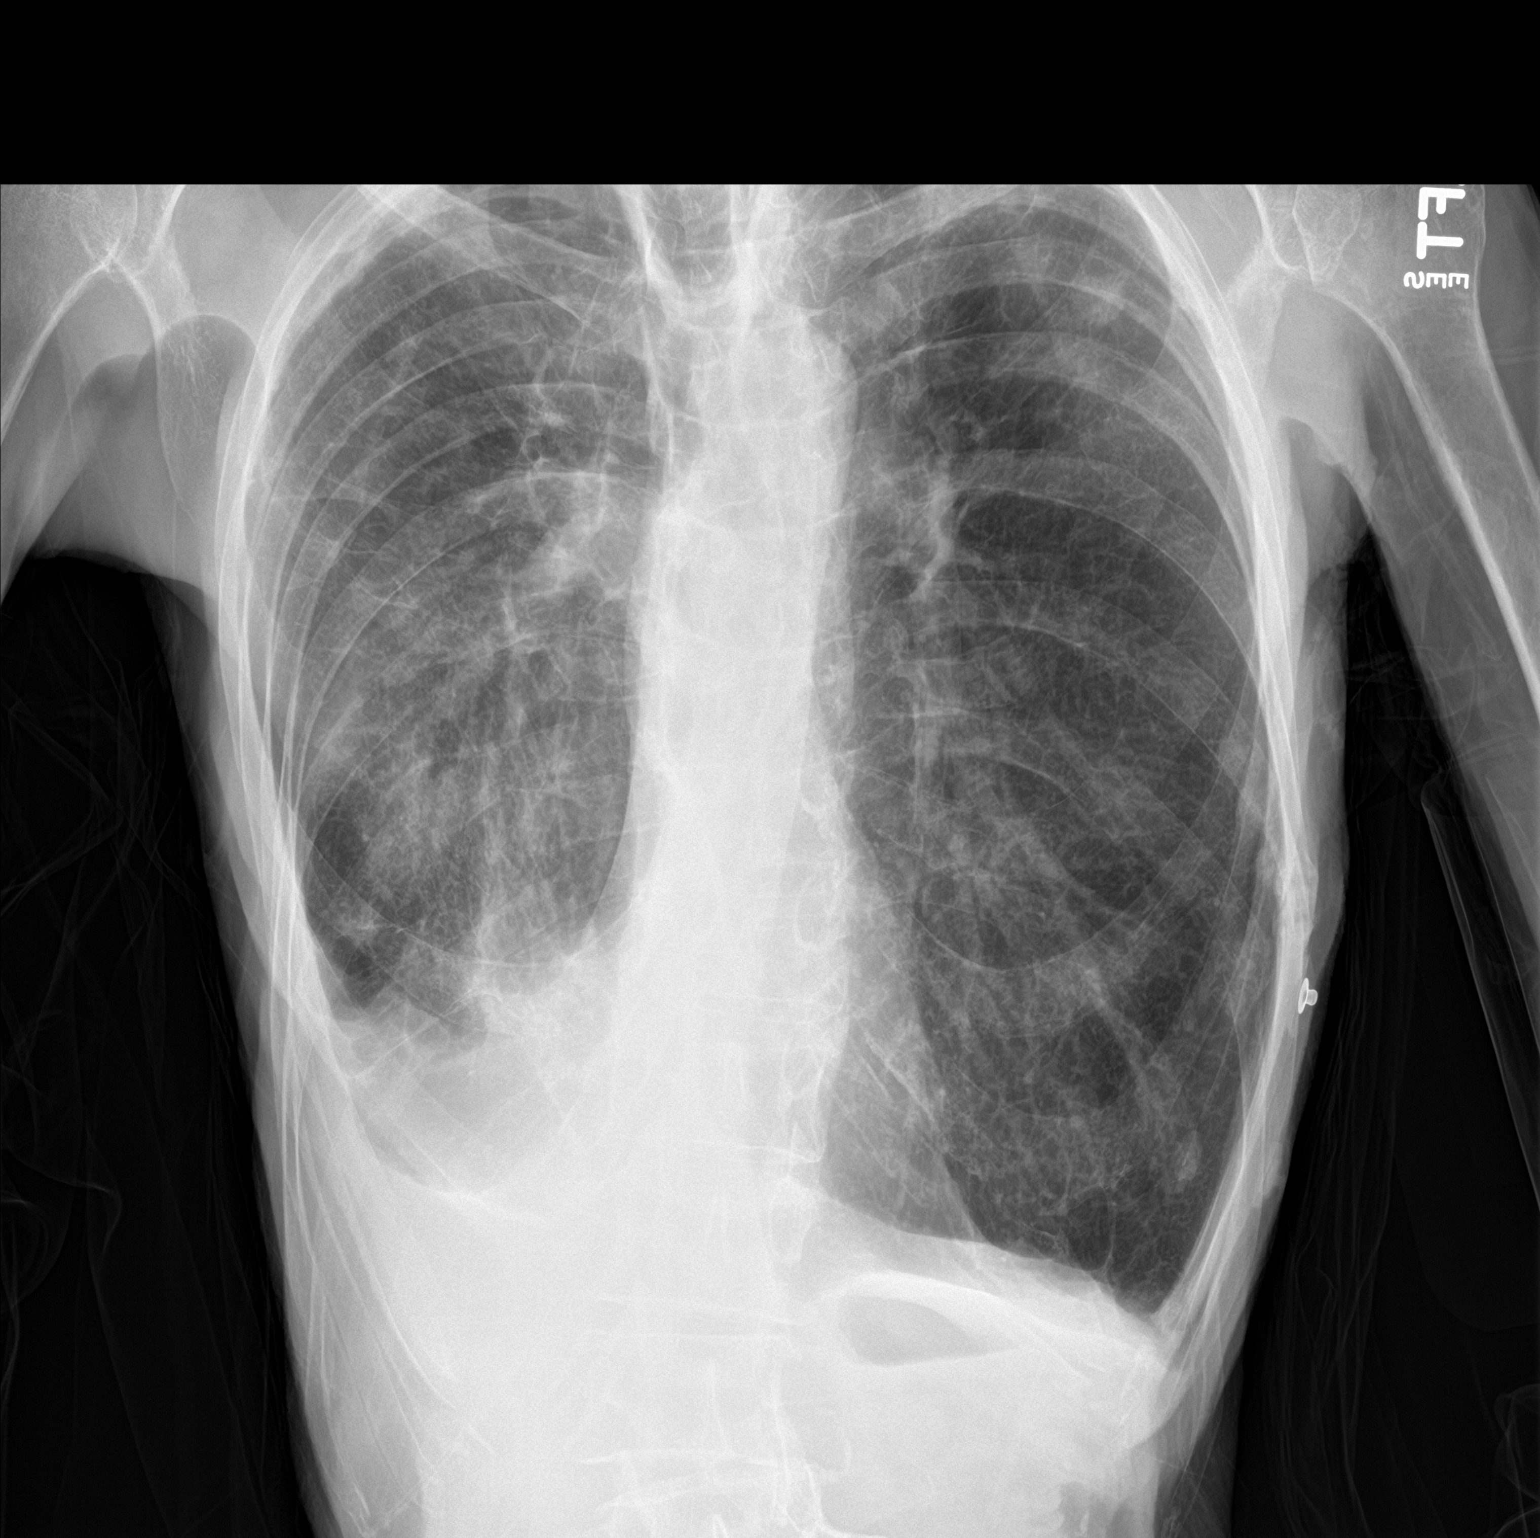

[chest lat]
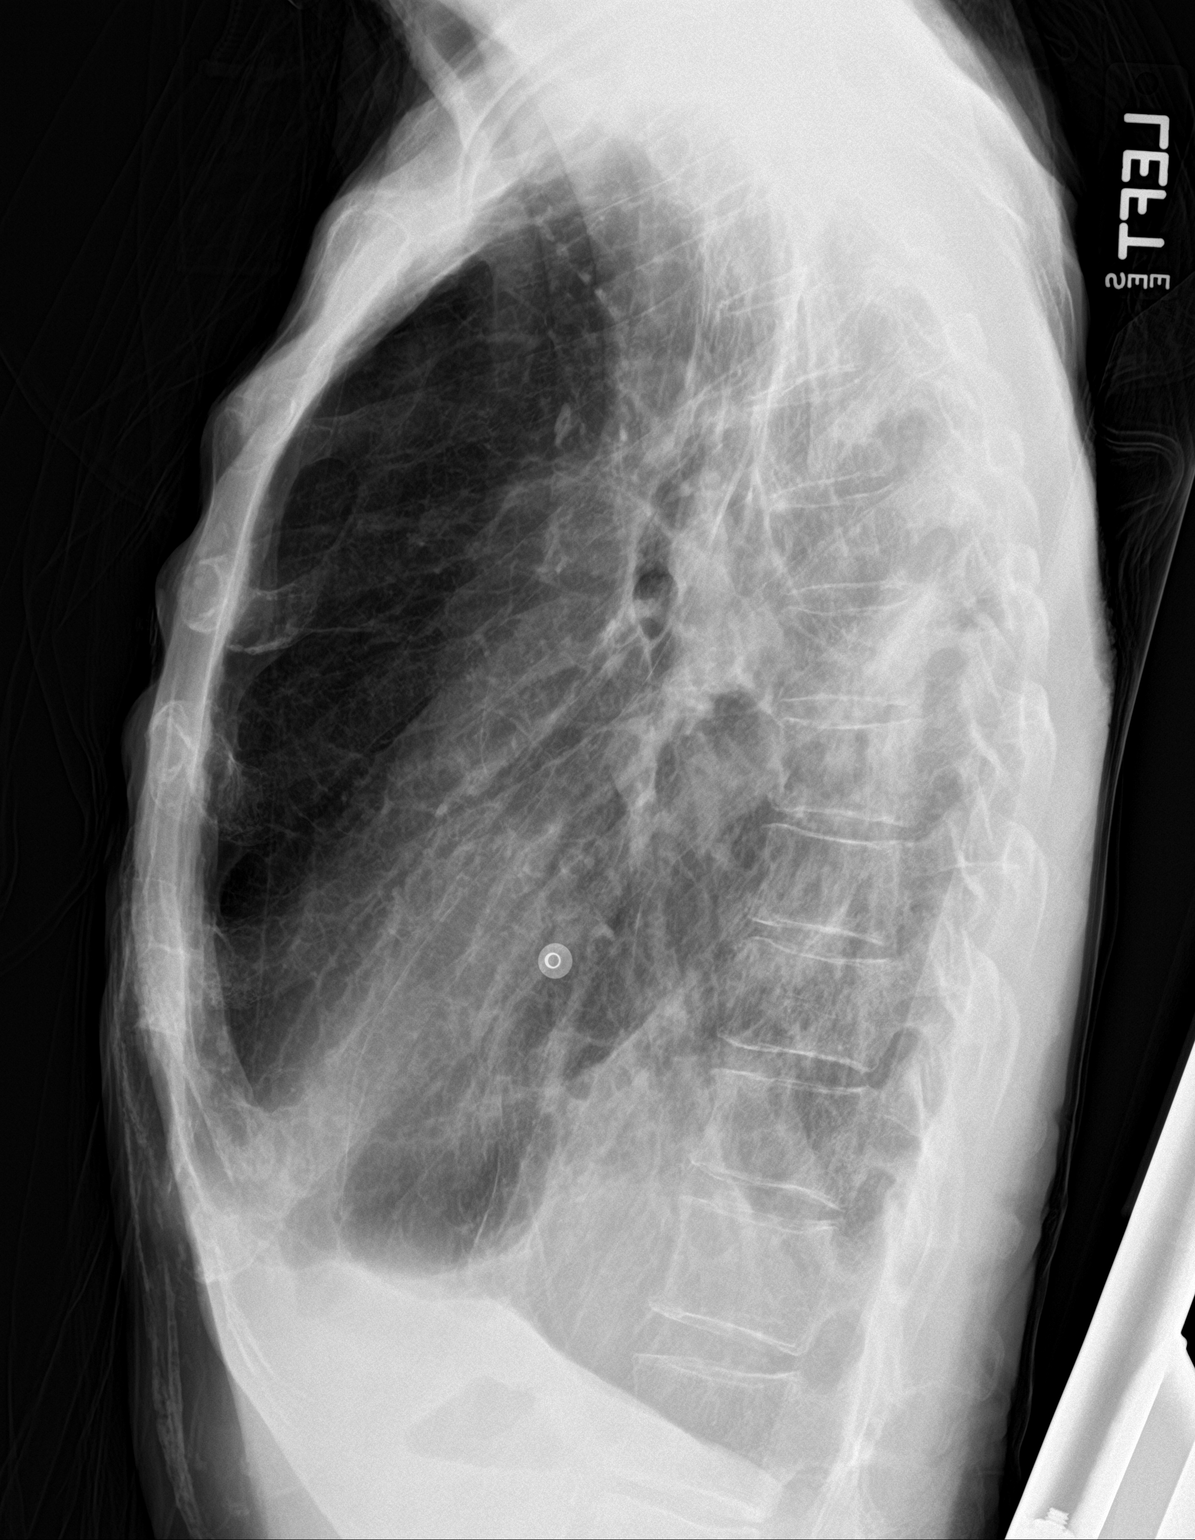

[2 of 2 positions shown; findings below may reference images not displayed]

FINDINGS: Severe emphysema. Areas of scarring in the upper lobes. Nodular
density in the left upper lung, shown on prior CT to be in the
superior segment of the left lower lobe, likely unchanged. Continued
consolidation in the right lower lobe with small right pleural
effusion. Heart is normal size. No acute bony abnormality.
IMPRESSION: Severe COPD.

Continued consolidation in the right lower lobe with right effusion,
concerning for pneumonia.

Bilateral upper lobe scarring. Nodular density in the left upper
lung as seen on prior CT and likely stable. Recommend continued
follow-up as recommended on prior chest CT.

## 2022-07-23 IMAGING — CT CT ANGIO CHEST
2 of 7 series · 18 of 46 positions shown · IV contrast (omnipaque)
Comparison: 12/19/2020

CLINICAL DATA: Left chest tightness

EXAM:
CT ANGIOGRAPHY CHEST WITH CONTRAST
TECHNIQUE: Multidetector CT imaging of the chest was performed using the
standard protocol during bolus administration of intravenous
contrast. Multiplanar CT image reconstructions and MIPs were
obtained to evaluate the vascular anatomy.
CONTRAST:  64mL OMNIPAQUE IOHEXOL 350 MG/ML SOLN

[Series 10: thins · axial · 0.62mm/px · z∈[+986,+1308]mm · 15 of 364 slices shown]
[im 21/364  lung]
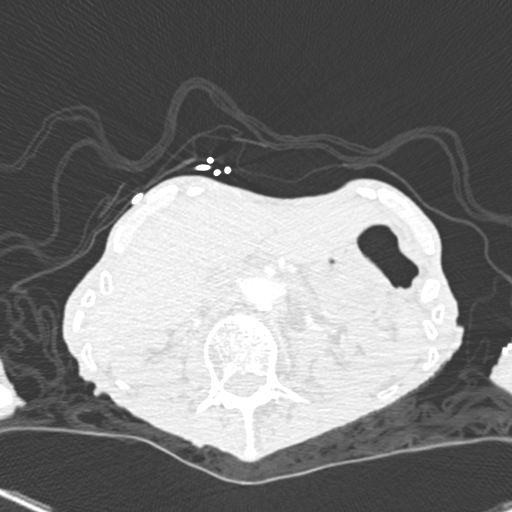
[im 41/364  soft-tissue]
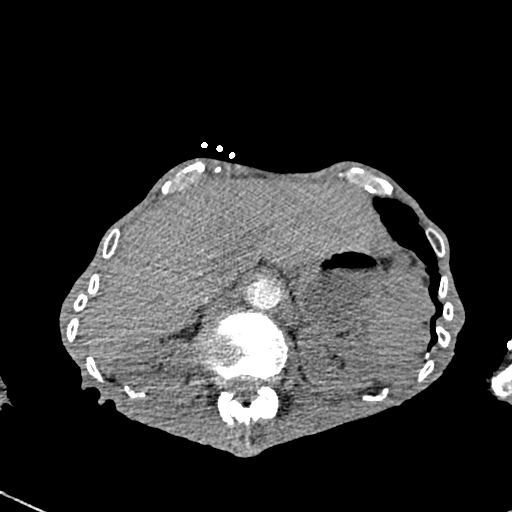
[im 61/364  lung]
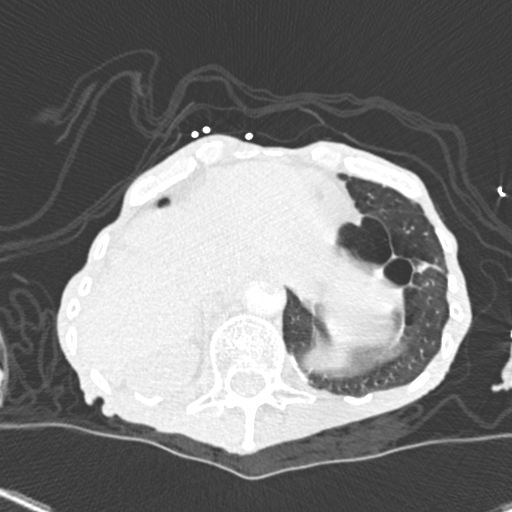
[im 81/364  soft-tissue]
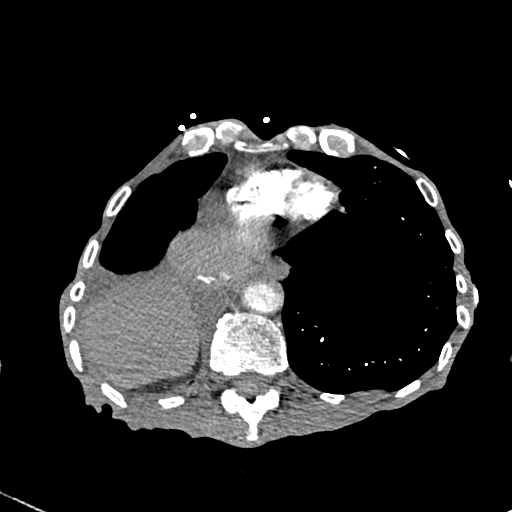
[im 122/364  lung]
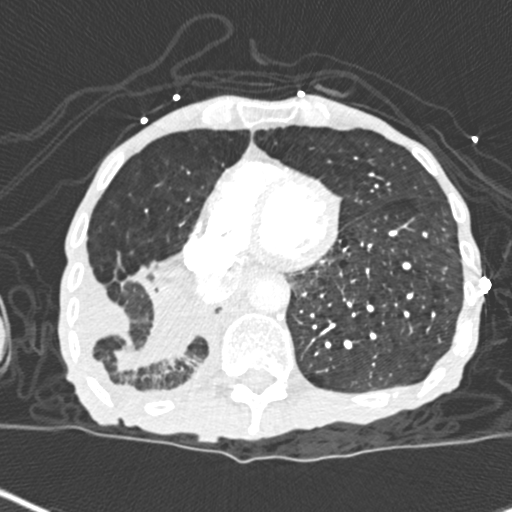
[im 142/364  soft-tissue]
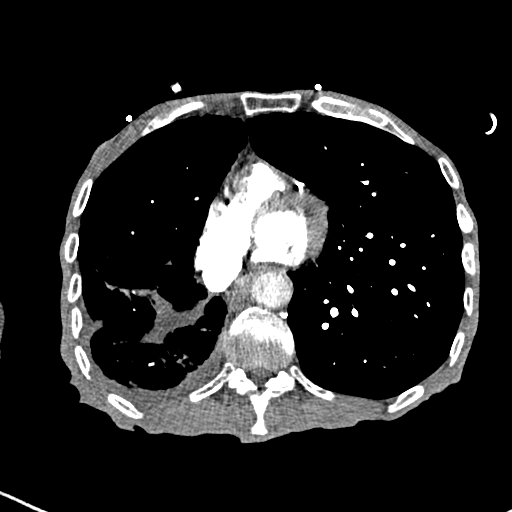
[im 162/364  lung]
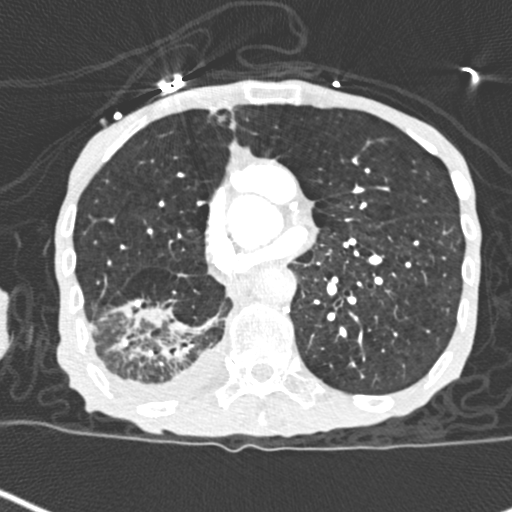
[im 182/364  soft-tissue]
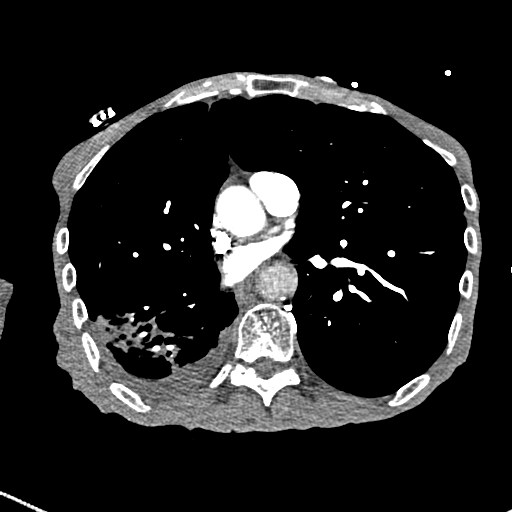
[im 202/364  lung]
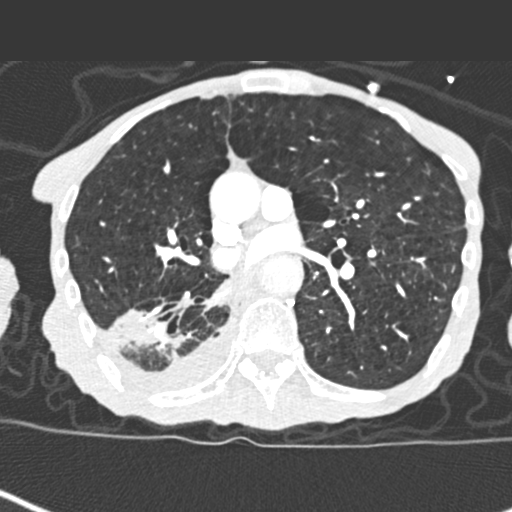
[im 222/364  soft-tissue]
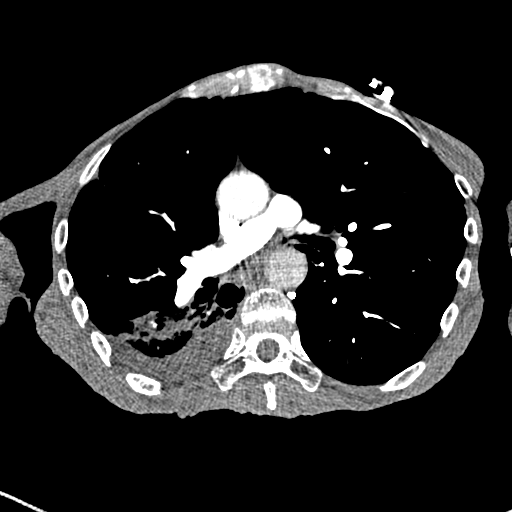
[im 243/364  lung]
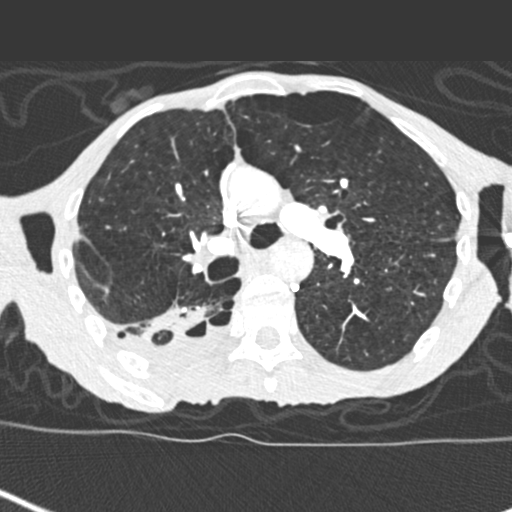
[im 283/364  soft-tissue]
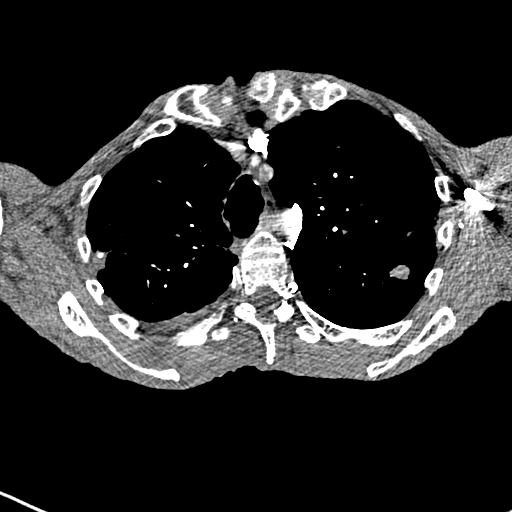
[im 303/364  lung]
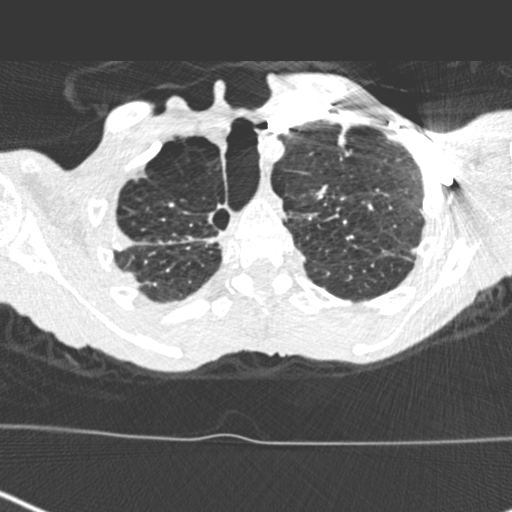
[im 323/364  soft-tissue]
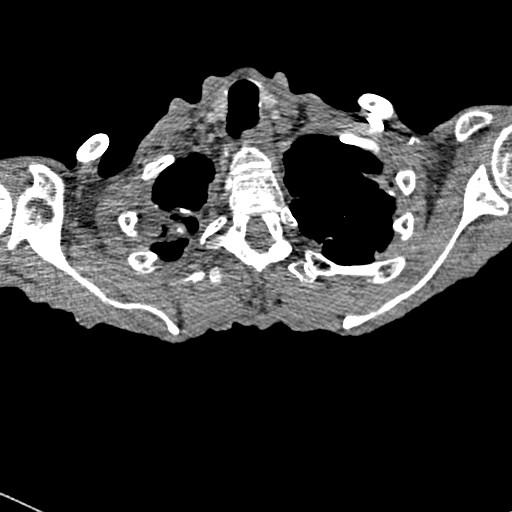
[im 343/364  lung]
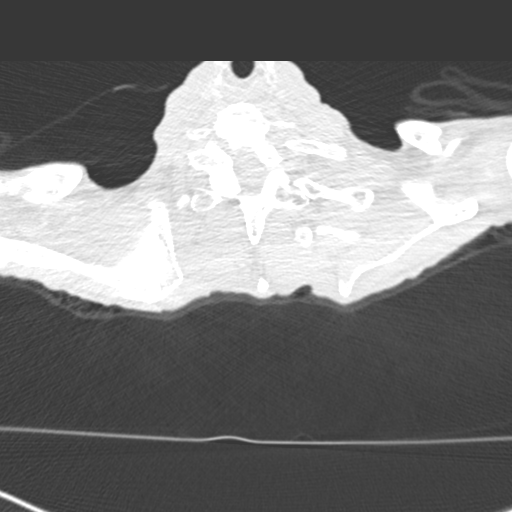

[Series 12: coronal mpr · coronal · 0.59mm/px · 3 of 116 slices shown]
[im 29/116  soft-tissue]
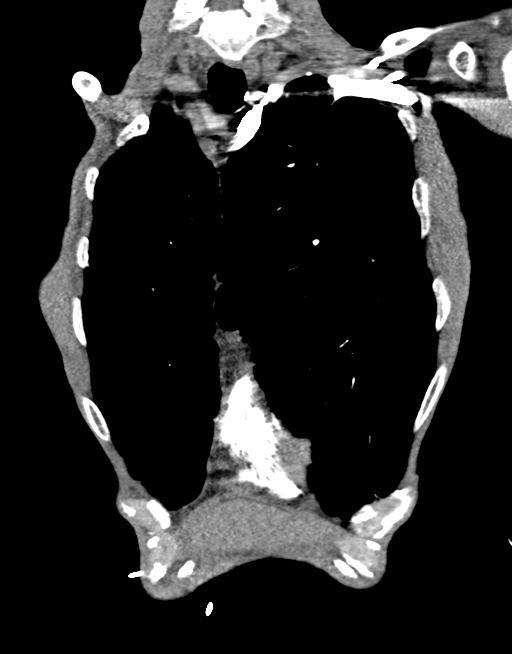
[im 58/116  soft-tissue]
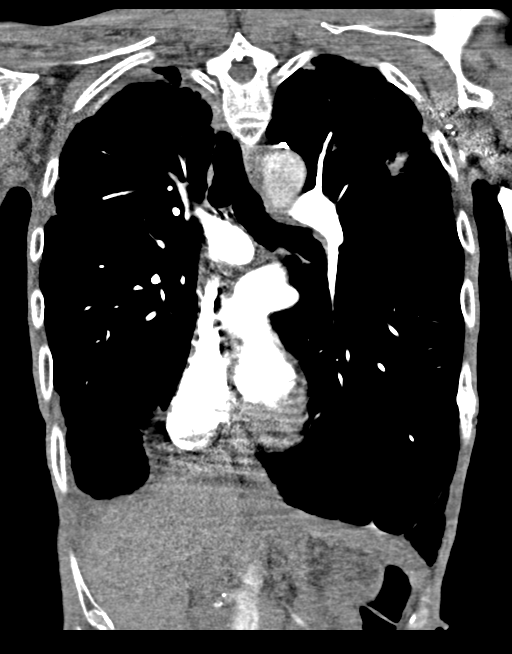
[im 87/116  soft-tissue]
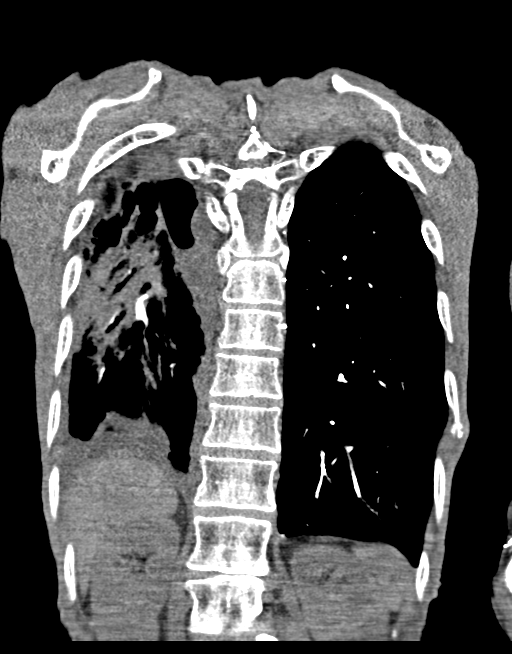

[18 of 46 positions shown; findings below may reference images not displayed]

FINDINGS: Cardiovascular: No filling defects in the pulmonary arteries to
suggest pulmonary emboli. Heart is normal size. Aorta is normal
caliber. Scattered aortic calcifications.

Mediastinum/Nodes: No mediastinal, hilar, or axillary adenopathy.
Trachea and esophagus are unremarkable. Thyroid unremarkable.

Lungs/Pleura: Severe emphysema. Biapical scarring. 12 mm nodule in
the superior segment of the left lower lobe is stable. Small right
pleural effusion again noted with consolidation in the right lower
lobe, stable since prior study and concerning for pneumonia. The SCHUBERT

Upper Abdomen: Imaging into the upper abdomen demonstrates no acute
findings.

Musculoskeletal: Chest wall soft tissues are unremarkable. No acute
bony abnormality.

Review of the MIP images confirms the above findings.
IMPRESSION: No evidence of pulmonary embolus.

Severe emphysema and scarring, stable.

Stable 12 mm nodule in the superior segment of the left lower lobe.
This could be further evaluated with nuclear medicine PET CT.

Continued consolidation in the right lower lobe with small right
effusion. Findings concerning for pneumonia.

Aortic Atherosclerosis (J42PM-CZJ.J) and Emphysema (J42PM-PY1.E).

## 2022-08-29 ENCOUNTER — Ambulatory Visit: Payer: Medicare Other | Attending: Internal Medicine

## 2022-09-15 ENCOUNTER — Telehealth: Payer: Self-pay | Admitting: Student

## 2022-09-15 DIAGNOSIS — R918 Other nonspecific abnormal finding of lung field: Secondary | ICD-10-CM

## 2022-09-15 DIAGNOSIS — F17200 Nicotine dependence, unspecified, uncomplicated: Secondary | ICD-10-CM

## 2022-09-15 NOTE — Telephone Encounter (Signed)
Due for lung cancer screening 06/2023

## 2022-09-19 ENCOUNTER — Other Ambulatory Visit: Payer: Medicare Other

## 2022-11-03 IMAGING — MR MR LUMBAR SPINE W/O CM
4 of 5 series · 20 of 48 positions shown · non-contrast
Comparison: 04/28/2017

CLINICAL DATA: Low back pain radiating to left hip for 3 months. No
known injury.

EXAM:
MRI LUMBAR SPINE WITHOUT CONTRAST
TECHNIQUE: Multiplanar, multisequence MR imaging of the lumbar spine was
performed. No intravenous contrast was administered.

[Series 5: T2 · sagittal · 4.0mm · 0.94mm/px · 7 of 19 slices shown (1 of 2)]
[im 1/19]
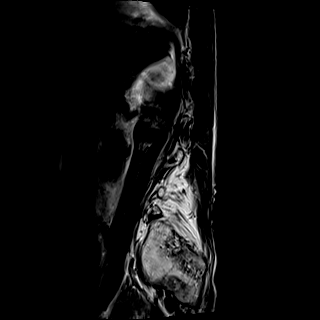
[im 4/19]
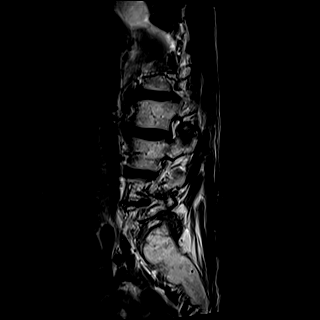
[im 7/19]
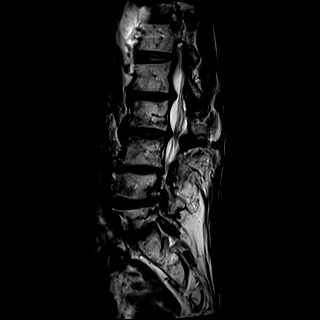
[im 10/19]
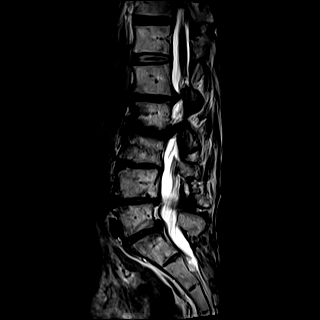
[im 13/19]
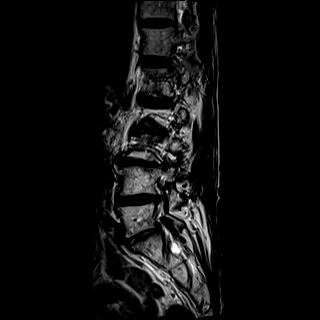
[im 16/19]
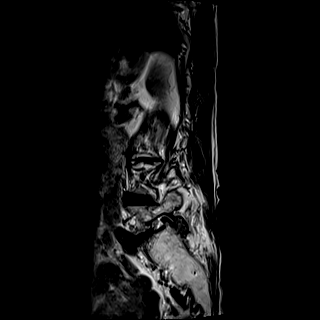
[im 19/19]
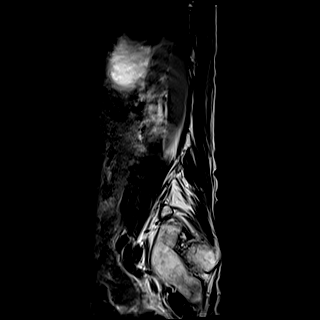

[Series 6: T1 · sagittal · 4.0mm · 0.94mm/px · 3 of 19 slices shown (1 of 2)]
[im 4/19]
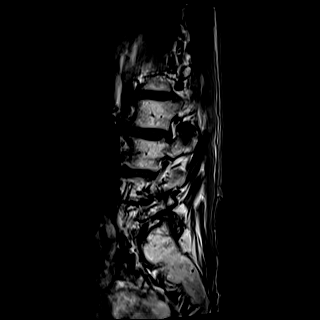
[im 10/19]
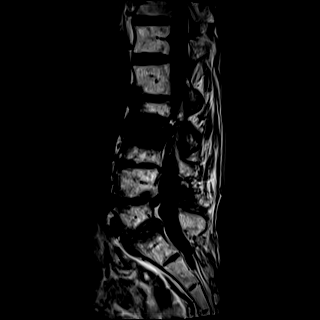
[im 16/19]
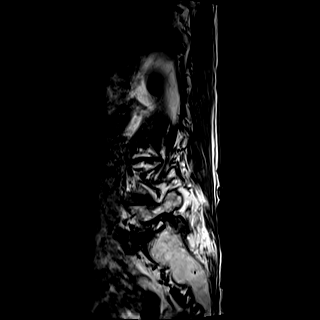

[Series 10: T1 · axial · 4.0mm · 0.28mm/px · z∈[-67,+72]mm · 3 of 42 slices shown (2 of 2)]
[im 7/42]
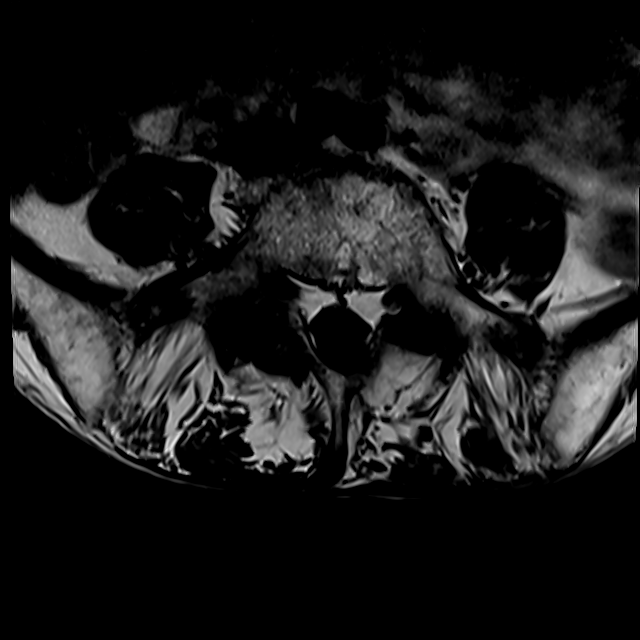
[im 23/42]
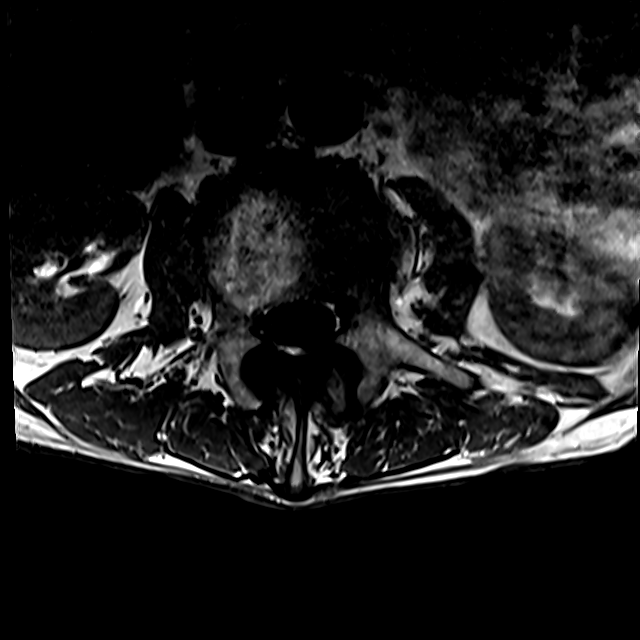
[im 35/42]
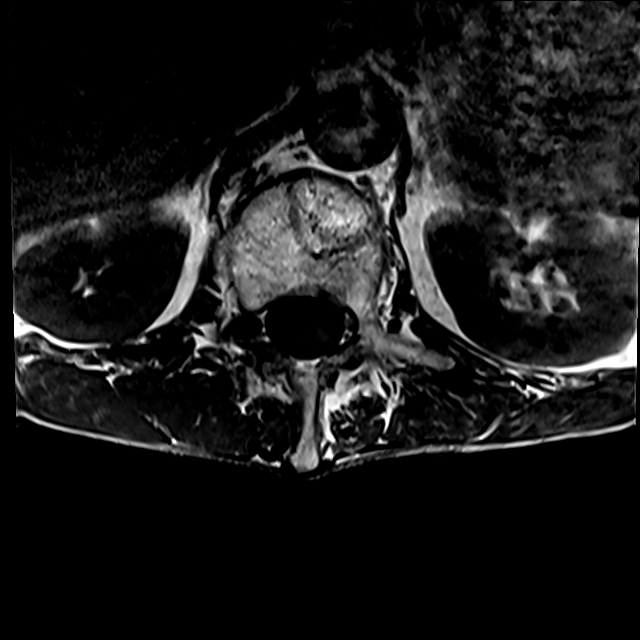

[Series 13: T2 · axial · 4.0mm · 0.28mm/px · z∈[-97,+72]mm · 7 of 42 slices shown (2 of 2)]
[im 1/42]
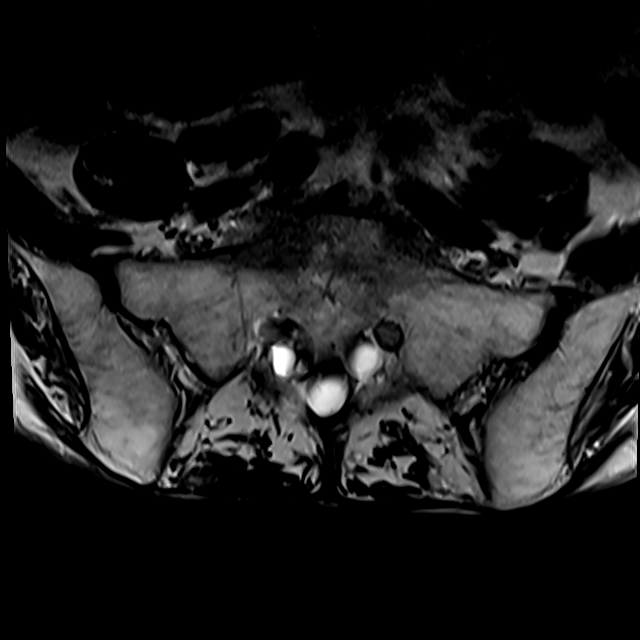
[im 7/42]
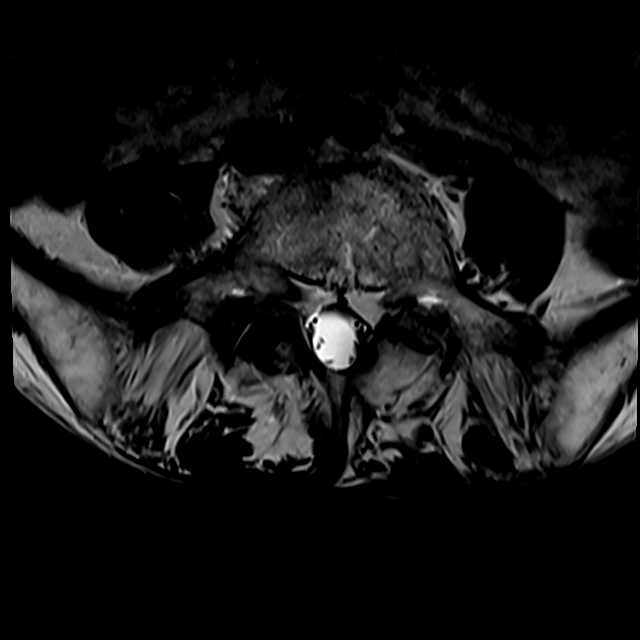
[im 13/42]
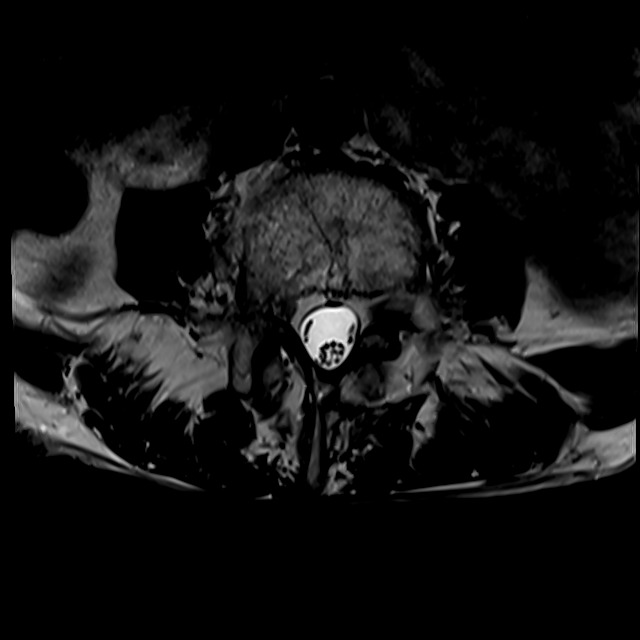
[im 19/42]
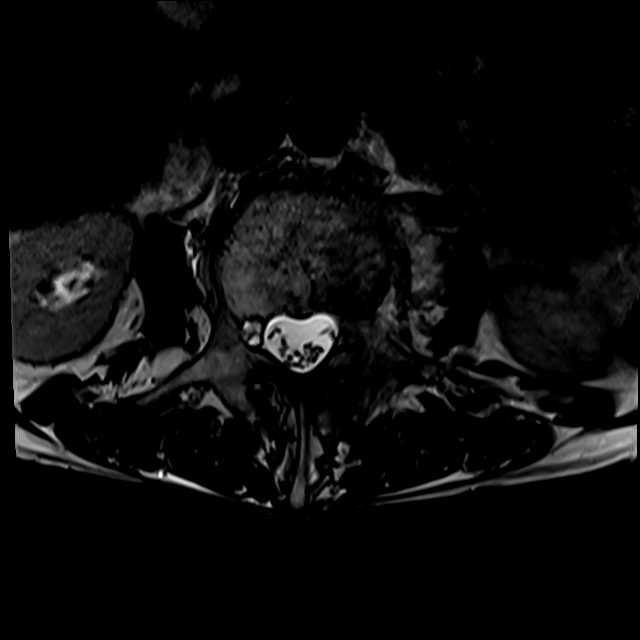
[im 23/42]
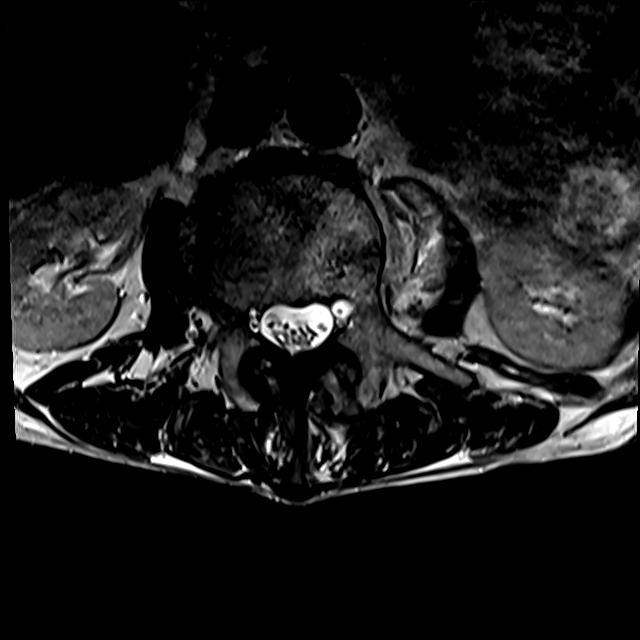
[im 29/42]
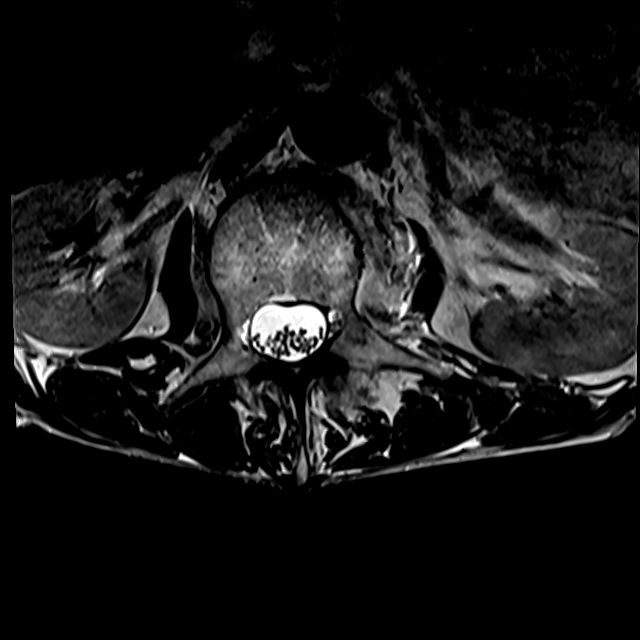
[im 35/42]
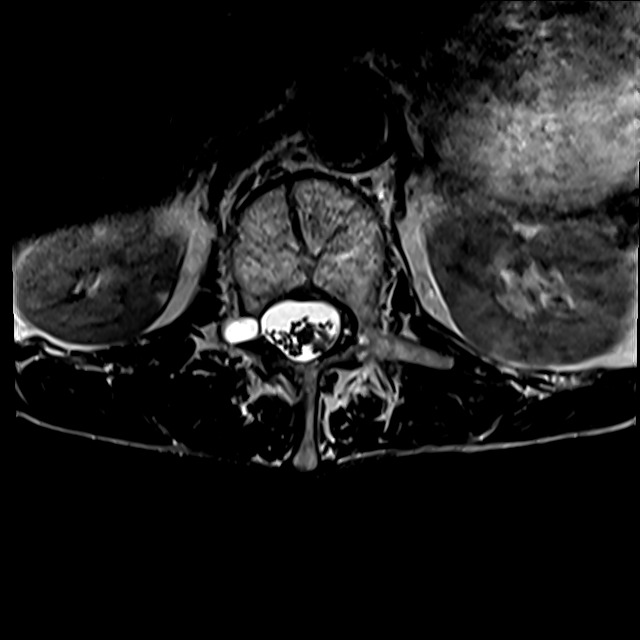

[20 of 48 positions shown; findings below may reference images not displayed]

FINDINGS: Segmentation:  Standard.

Alignment: Grade 1 anterolisthesis of L5 on S1 secondary to facet
disease. 2 mm retrolisthesis of L4 on L5.

Vertebrae: No acute fracture, evidence of discitis, or aggressive
bone lesion.

Conus medullaris and cauda equina: Conus extends to the L1 level.
Conus and cauda equina appear normal. Sacral Tarlov cyst noted.

Paraspinal and other soft tissues: No acute paraspinal abnormality.

Disc levels:

Disc spaces: Degenerative disease with disc height loss at L1-2,
L2-3, L3-4, L4-5 and L5-S1 with reactive endplate edema at L2-3,
L4-5 and L5-S1 which has progressed compared with the prior exam.

T12-L1: No significant disc bulge. No neural foraminal stenosis. No
central canal stenosis.

L1-L2: Broad-based disc bulge eccentric towards the left. Moderate
bilateral facet arthropathy. Left subarticular recess stenosis. Mild
spinal stenosis. Mild left foraminal stenosis. No right foraminal
stenosis. Right perineural cyst.

L2-L3: Broad-based disc bulge with a broad left
paracentral/foraminal disc protrusion. Moderate bilateral facet
arthropathy. Severe spinal stenosis. Left subarticular recess
stenosis. Severe left foraminal stenosis. Mild right foraminal
stenosis.

L3-L4: Mild broad-based disc bulge. Moderate bilateral facet
arthropathy. No foraminal or central canal stenosis.

L4-L5: Broad-based disc bulge. Mild bilateral facet arthropathy.
Bilateral subarticular recess stenosis. Severe right foraminal
stenosis. No left foraminal stenosis. No spinal stenosis.

L5-S1: Broad-based disc bulge. Moderate bilateral facet arthropathy.
Moderate-severe bilateral foraminal stenosis. No spinal stenosis.
IMPRESSION: 1. Diffuse lumbar spine spondylosis as described above which has
significantly progressed compared with 04/28/2017.
2. No acute osseous injury of the lumbar spine.

## 2022-11-07 ENCOUNTER — Other Ambulatory Visit: Payer: Self-pay | Admitting: Nurse Practitioner

## 2022-11-07 DIAGNOSIS — M48061 Spinal stenosis, lumbar region without neurogenic claudication: Secondary | ICD-10-CM

## 2022-11-11 ENCOUNTER — Ambulatory Visit: Payer: Medicare Other | Attending: Cardiology

## 2022-11-17 ENCOUNTER — Inpatient Hospital Stay: Admission: RE | Admit: 2022-11-17 | Payer: Medicare Other | Source: Ambulatory Visit

## 2022-11-17 NOTE — Progress Notes (Deleted)
Office Visit    Patient Name: [name] Date of Encounter: 11/17/2022  Primary Care Provider:  Garlan Fillers, MD Primary Cardiologist:  None Primary Electrophysiologist: None   Past Medical History    Past Medical History:  Diagnosis Date   Anxiety    Aortic atherosclerosis (HCC)    Arthritis    CAD (coronary artery disease), native coronary artery    Coronary CTA showed a coronary calcium score of 879 which was 81st percentile for age and sex matched controls.  50 to 69% proximal RCA, less than 25% proximal and distal RCA, 50 to 69% proximal LAD with normal FFR 04/2022   Cataract    COPD (chronic obstructive pulmonary disease) (HCC)    inhaler   Depression    Neuromuscular disorder (HCC)    bulding disc   Past Surgical History:  Procedure Laterality Date   BACK SURGERY     LUMBAR DISC SURGERY Left 06/2003   L4 on L3 hemilaminectomy, foraminotomy for 3-4,4-5 with left L3-L4 extraforaminal diskectomy/notes 08/22/2010    REPAIR DURAL / CSF LEAK  07/2003   Hattie Perch 08/22/2010    Allergies  Allergies  Allergen Reactions   Erythromycin Base Other (See Comments)    hallucinations   Mirtazapine Other (See Comments)   Nsaids Other (See Comments)     History of Present Illness    [name] is a 72 y.o. male with PMH of CAD (CTA showed calcium score of 879), orthostatic hypotension aortic atherosclerosis, COPD, presyncope who presents today for 6-week follow-up for hypotension.   [name] was initially seen by Dr. Mayford Knife on 04/24/2022 for complaint of syncope by his PCP. He reported 3 episodes that occurred and had loss of consciousness. He reported these occur when he stands from a sitting position. He also reports palpitations prior to these events. He also reported some DOE. Orthostatic BPs were obtained that were positive. He was given a prescription for compression hose. 2D echo was completed that revealed EF of 55 to 60% with grade 1 DD and no evidence of  aortic stenosis with mild calcification. Coronary CTA was also completed and showed calcium score of 879 with mild to moderate mixed CAD with aortic atherosclerosis. He wore a ZIO monitor that showed predominantly sinus rhythm with rare PACs. He was last seen 05/24/2022 for follow-up visit. He reported ongoing presyncope and chest discomfort. He also admitted to not wearing his compression hose is not maintaining good fluid hydration. He was not orthostatic on exam and midodrine was not indicated. Chest pressure was also felt to be possibly related to underlying emphysema.    Since last being seen in the office patient reports***.  Patient denies chest pain, palpitations, dyspnea, PND, orthopnea, nausea, vomiting, dizziness, syncope, edema, weight gain, or early satiety.     ***Notes:  Home Medications    Current Outpatient Medications  Medication Sig Dispense Refill   buPROPion (WELLBUTRIN XL) 150 MG 24 hr tablet Take by mouth.     busPIRone (BUSPAR) 5 MG tablet Take by mouth.     clonazePAM (KLONOPIN) 1 MG tablet      diclofenac (VOLTAREN) 75 MG EC tablet Take 75 mg by mouth 2 (two) times daily.     DULoxetine (CYMBALTA) 30 MG capsule Take 30 mg by mouth daily.     ferrous sulfate 325 (65 FE) MG tablet Take by mouth.     fluticasone-salmeterol (ADVAIR HFA) 230-21 MCG/ACT inhaler Inhale into the lungs.  fluticasone-salmeterol (WIXELA INHUB) 500-50 MCG/ACT AEPB      gabapentin (NEURONTIN) 300 MG capsule Take 300 mg by mouth 3 (three) times daily.     HYDROcodone-acetaminophen (NORCO) 7.5-325 MG tablet Take 1 tablet by mouth every 6 (six) hours as needed for moderate pain or severe pain.     ibuprofen (ADVIL) 800 MG tablet      ipratropium-albuterol (DUONEB) 0.5-2.5 (3) MG/3ML SOLN Take 3 mLs by nebulization 4 (four) times daily.     lactose free nutrition (BOOST) LIQD Take 237 mLs by mouth 3 (three) times daily between meals.     levothyroxine (SYNTHROID) 25 MCG tablet Take 25 mcg by  mouth 3 (three) times a week.     mirtazapine (REMERON) 30 MG tablet Take 30 mg by mouth at bedtime.     Multiple Vitamins-Minerals (CENTRUM SILVER 50+MEN) TABS Take 1 tablet by mouth daily.     nicotine (NICODERM CQ - DOSED IN MG/24 HOURS) 21 mg/24hr patch Place onto the skin.     polyethylene glycol powder (GLYCOLAX/MIRALAX) 17 GM/SCOOP powder Take by mouth.     rosuvastatin (CRESTOR) 20 MG tablet Take 1 tablet (20 mg total) by mouth daily. 90 tablet 3   traMADol (ULTRAM) 50 MG tablet      traZODone (DESYREL) 50 MG tablet Take 25-50 mg by mouth at bedtime as needed for sleep.     No current facility-administered medications for this visit.     Review of Systems  Please see the history of present illness.    (+)*** (+)***  All other systems reviewed and are otherwise negative except as noted above.  Physical Exam    Wt Readings from Last 3 Encounters:  06/03/22 102 lb (46.3 kg)  05/24/22 101 lb 12.8 oz (46.2 kg)  04/24/22 107 lb 3.2 oz (48.6 kg)   ZO:XWRUE were no vitals filed for this visit.,There is no height or weight on file to calculate BMI.  Constitutional:      Appearance: Healthy appearance. Not in distress.  Neck:     Vascular: JVD normal.  Pulmonary:     Effort: Pulmonary effort is normal.     Breath sounds: No wheezing. No rales. Diminished in the bases Cardiovascular:     Normal rate. Regular rhythm. Normal S1. Normal S2.      Murmurs: There is no murmur.  Edema:    Peripheral edema absent.  Abdominal:     Palpations: Abdomen is soft non tender. There is no hepatomegaly.  Skin:    General: Skin is warm and dry.  Neurological:     General: No focal deficit present.     Mental Status: Alert and oriented to person, place and time.     Cranial Nerves: Cranial nerves are intact.  EKG/LABS/ Recent Cardiac Studies    ECG personally reviewed by me today - ***   Risk Assessment/Calculations:   {Does this patient have ATRIAL FIBRILLATION?:[id number]}         Lab Results  Component Value Date   WBC 5.8 01/25/2021   HGB 11.3 (L) 01/25/2021   HCT 34.4 (L) 01/25/2021   MCV 93.5 01/25/2021   PLT 272 01/25/2021   Lab Results  Component Value Date   CREATININE 0.90 04/25/2022   BUN 23 04/25/2022   NA 136 04/25/2022   K 4.9 04/25/2022   CL 99 04/25/2022   CO2 22 04/25/2022   Lab Results  Component Value Date   ALT 9 07/10/2022   AST 19 05/08/2022   ALKPHOS  101 05/08/2022   BILITOT 0.3 05/08/2022   Lab Results  Component Value Date   CHOL 166 07/10/2022   HDL 62 07/10/2022   LDLCALC 86 07/10/2022   TRIG 99 07/10/2022   CHOLHDL 2.7 07/10/2022    No results found for: "HGBA1C"   Assessment & Plan    1.  Presyncope/orthostasis: -Patient had experienced previous episodes of blackout spells.  Today patient reports***     2.  CAD/coronary calcifications: -Previous cardiac CTA showed mild to moderate CAD with normal FFR -Today patient reports***   3.  Hyperlipidemia -Patient's last LDL cholesterol was***  4.  History of COPD:      Disposition: Follow-up with None or APP in *** months {Are you ordering a CV Procedure (e.g. stress test, cath, DCCV, TEE, etc)?   Press F2        :[id number]}   Medication Adjustments/Labs and Tests Ordered: Current medicines are reviewed at length with the patient today.  Concerns regarding medicines are outlined above.   Signed, Napoleon Form, Leodis Rains, NP 11/17/2022, 3:18 PM [facility]

## 2022-11-18 ENCOUNTER — Encounter: Payer: Self-pay | Admitting: Nurse Practitioner

## 2022-11-18 ENCOUNTER — Ambulatory Visit: Payer: Medicare Other | Attending: Nurse Practitioner | Admitting: Nurse Practitioner

## 2022-11-18 DIAGNOSIS — I700 Atherosclerosis of aorta: Secondary | ICD-10-CM

## 2022-11-18 DIAGNOSIS — E782 Mixed hyperlipidemia: Secondary | ICD-10-CM

## 2022-11-18 DIAGNOSIS — I951 Orthostatic hypotension: Secondary | ICD-10-CM

## 2022-11-18 DIAGNOSIS — J439 Emphysema, unspecified: Secondary | ICD-10-CM

## 2022-11-18 DIAGNOSIS — I2510 Atherosclerotic heart disease of native coronary artery without angina pectoris: Secondary | ICD-10-CM

## 2022-12-11 ENCOUNTER — Telehealth: Payer: Self-pay | Admitting: Gastroenterology

## 2022-12-11 NOTE — Telephone Encounter (Signed)
Left message on machine to call back  

## 2022-12-11 NOTE — Telephone Encounter (Signed)
The pt returned call and we discussed that there are no sooner appts available at this time.  He agrees to keep appt as planned and will go to the ED if he feels it is needed or call his PCP.

## 2022-12-11 NOTE — Telephone Encounter (Signed)
Inbound call from patient requesting schedule an appointment for barretts esop. constipation and epigastric pain. Scheduled patient for soonest appointment 11/14. States provider is worried about an obstruction or blockage and advised the patient to be seen soon. Patient requesting a call to discuss further. Please advise, thank you.

## 2022-12-12 ENCOUNTER — Inpatient Hospital Stay: Admission: RE | Admit: 2022-12-12 | Payer: Medicare Other | Source: Ambulatory Visit

## 2023-02-10 ENCOUNTER — Inpatient Hospital Stay: Admission: RE | Admit: 2023-02-10 | Payer: Medicare Other | Source: Ambulatory Visit

## 2023-02-20 ENCOUNTER — Ambulatory Visit: Payer: Medicare Other | Admitting: Nurse Practitioner

## 2023-02-20 NOTE — Progress Notes (Deleted)
02/20/2023 [name] [id number] 04/07/1951   CHIEF COMPLAINT: Barrett's esophagus, epigastric pain and constipation  HISTORY OF PRESENT ILLNESS: [name] is a 72 year old male with a past medical history of anxiety, depression, arthritis, coronary artery disease, GERD and Barrett's esophagus.     Latest Ref Rng & Units 01/25/2021    5:47 AM 01/24/2021    3:09 AM 01/23/2021    3:03 AM  CBC  WBC 4.0 - 10.5 K/uL 5.8  6.2  6.1   Hemoglobin 13.0 - 17.0 g/dL 44.0  34.7  42.5   Hematocrit 39.0 - 52.0 % 34.4  32.9  33.1   Platelets 150 - 400 K/uL 272  265  296         Latest Ref Rng & Units 07/10/2022   10:23 AM 05/08/2022    1:09 PM 04/25/2022    1:12 PM  CMP  Glucose 70 - 99 mg/dL   956   BUN 8 - 27 mg/dL   23   Creatinine 3.87 - 1.27 mg/dL   5.64   Sodium 332 - 951 mmol/L   136   Potassium 3.5 - 5.2 mmol/L   4.9   Chloride 96 - 106 mmol/L   99   CO2 20 - 29 mmol/L   22   Calcium 8.6 - 10.2 mg/dL   88.4   Total Protein 6.0 - 8.5 g/dL  7.3    Total Bilirubin 0.0 - 1.2 mg/dL  0.3    Alkaline Phos 44 - 121 IU/L  101    AST 0 - 40 IU/L  19    ALT 0 - 44 IU/L 9  6      CTAP 11/27/2022: FINDINGS:  LUNG BASES: There is bibasilar pulmonary parenchymal scarring. Atherosclerotic calcifications of the coronary arteries are noted.   HEPATOBILIARY: No suspicious contour-deforming hepatic mass is seen.  No significant biliary ductal dilatation is seen.  GALLBLADDER/FOSSA: No acute abnormality is seen.  PANCREAS: No suspicious contour-deforming mass is seen. No acute peripancreatic fluid collection is seen.  SPLEEN: No acute abnormality is seen. No suspicious contour-deforming mass is seen.  ADRENALS: No suspicious contour-deforming mass is seen.  KIDNEYS/RETROPERITONEUM: No significant hydronephrosis is seen. No suspicious solid renal mass is definitively seen.   GASTROINTESTINAL/MESENTERY: No acute mesenteric inflammatory process originating from the gastrointestinal  structures is seen. No pneumoperitoneum is seen. No small bowel obstruction is seen. . There are long segments of near-continuous stool in the colon, without significant associated luminal distention; findings may be suggestive of constipation. There is colonic diverticulosis.  FREE FLUID: No significant layering free fluid is seen.   LYMPH NODES: No suspicious lymphadenopathy (by CT size criteria) is seen.  PELVIC STRUCTURES: The urinary bladder wall is unable to be accurately assessed due to underdistention; correlation with urinalysis can be considered (as clinically warranted) to exclude urinary tract infection/cystitis. Marland Kitchen   VASCULATURE: No abdominal aortic aneurysm is seen. Atherosclerotic calcifications are noted.  BONES: No acute fracture, dislocation, or destructive osseous lesion is seen. Degenerative changes are noted. No acute intra-abdominal inflammatory or obstructive process is seen.    .  Colonoscopy 08/02/2016 by Dr. Russella Dar: - One 17 mm polyp at the recto-sigmoid colon, removed with a hot snare. Resected and retrieved. - Three 6 to 7 mm polyps in the sigmoid colon and in the transverse colon, removed with a cold snare. Resected and retrieved. - One 9 mm polyp in the transverse colon, removed with a hot snare. Resected and retrieved. -  The examination was otherwise normal on direct and retroflexion views. - Recall colonoscopy 3 years   Colonoscopy 06/28/2008:  EGD 06/28/2008: Normal EGD Small hiatal hernia   Past Medical History:  Diagnosis Date   Anxiety    Aortic atherosclerosis (HCC)    Arthritis    CAD (coronary artery disease), native coronary artery    Coronary CTA showed a coronary calcium score of 879 which was 81st percentile for age and sex matched controls.  50 to 69% proximal RCA, less than 25% proximal and distal RCA, 50 to 69% proximal LAD with normal FFR 04/2022   Cataract    COPD (chronic obstructive pulmonary disease) (HCC)    inhaler   Depression     Neuromuscular disorder (HCC)    bulding disc   Past Surgical History:  Procedure Laterality Date   BACK SURGERY     LUMBAR DISC SURGERY Left 06/2003   L4 on L3 hemilaminectomy, foraminotomy for 3-4,4-5 with left L3-L4 extraforaminal diskectomy/notes 08/22/2010    REPAIR DURAL / CSF LEAK  07/2003   Hattie Perch 08/22/2010   Social History:  Family History:    reports that he has been smoking cigarettes. He has a 102 pack-year smoking history. He has never used smokeless tobacco. He reports that he does not currently use alcohol after a past usage of about 6.0 standard drinks of alcohol per week. He reports that he does not use drugs. family history includes Colon cancer in his paternal grandfather; Colon polyps in his father; Diabetes in his brother, father, paternal uncle, and son; Heart disease in his father; Heart failure in his father; Muscular dystrophy in his mother; Stroke in his father.  Allergies  Allergen Reactions   Erythromycin Base Other (See Comments)    hallucinations   Mirtazapine Other (See Comments)   Nsaids Other (See Comments)      Outpatient Encounter Medications as of 02/20/2023  Medication Sig   buPROPion (WELLBUTRIN XL) 150 MG 24 hr tablet Take by mouth.   busPIRone (BUSPAR) 5 MG tablet Take by mouth.   clonazePAM (KLONOPIN) 1 MG tablet    diclofenac (VOLTAREN) 75 MG EC tablet Take 75 mg by mouth 2 (two) times daily.   DULoxetine (CYMBALTA) 30 MG capsule Take 30 mg by mouth daily.   ferrous sulfate 325 (65 FE) MG tablet Take by mouth.   fluticasone-salmeterol (ADVAIR HFA) 230-21 MCG/ACT inhaler Inhale into the lungs.   fluticasone-salmeterol (WIXELA INHUB) 500-50 MCG/ACT AEPB    gabapentin (NEURONTIN) 300 MG capsule Take 300 mg by mouth 3 (three) times daily.   HYDROcodone-acetaminophen (NORCO) 7.5-325 MG tablet Take 1 tablet by mouth every 6 (six) hours as needed for moderate pain or severe pain.   ibuprofen (ADVIL) 800 MG tablet    ipratropium-albuterol (DUONEB)  0.5-2.5 (3) MG/3ML SOLN Take 3 mLs by nebulization 4 (four) times daily.   lactose free nutrition (BOOST) LIQD Take 237 mLs by mouth 3 (three) times daily between meals.   levothyroxine (SYNTHROID) 25 MCG tablet Take 25 mcg by mouth 3 (three) times a week.   mirtazapine (REMERON) 30 MG tablet Take 30 mg by mouth at bedtime.   Multiple Vitamins-Minerals (CENTRUM SILVER 50+MEN) TABS Take 1 tablet by mouth daily.   nicotine (NICODERM CQ - DOSED IN MG/24 HOURS) 21 mg/24hr patch Place onto the skin.   polyethylene glycol powder (GLYCOLAX/MIRALAX) 17 GM/SCOOP powder Take by mouth.   rosuvastatin (CRESTOR) 20 MG tablet Take 1 tablet (20 mg total) by mouth daily.   traMADol Janean Sark)  50 MG tablet    traZODone (DESYREL) 50 MG tablet Take 25-50 mg by mouth at bedtime as needed for sleep.   No facility-administered encounter medications on file as of 02/20/2023.     REVIEW OF SYSTEMS:  Gen: Denies fever, sweats or chills. No weight loss.  CV: Denies chest pain, palpitations or edema. Resp: Denies cough, shortness of breath of hemoptysis.  GI: Denies heartburn, dysphagia, stomach or lower abdominal pain. No diarrhea or constipation.  GU: Denies urinary burning, blood in urine, increased urinary frequency or incontinence. MS: Denies joint pain, muscles aches or weakness. Derm: Denies rash, itchiness, skin lesions or unhealing ulcers. Psych: Denies depression, anxiety, memory loss or confusion. Heme: Denies bruising, easy bleeding. Neuro:  Denies headaches, dizziness or paresthesias. Endo:  Denies any problems with DM, thyroid or adrenal function.  PHYSICAL EXAM: There were no vitals taken for this visit. General: in no acute distress. Head: Normocephalic and atraumatic. Eyes:  Sclerae non-icteric, conjunctive pink. Ears: Normal auditory acuity. Mouth: Dentition intact. No ulcers or lesions.  Neck: Supple, no lymphadenopathy or thyromegaly.  Lungs: Clear bilaterally to auscultation without  wheezes, crackles or rhonchi. Heart: Regular rate and rhythm. No murmur, rub or gallop appreciated.  Abdomen: Soft, nontender, nondistended. No masses. No hepatosplenomegaly. Normoactive bowel sounds x 4 quadrants.  Rectal:  Musculoskeletal: Symmetrical with no gross deformities. Skin: Warm and dry. No rash or lesions on visible extremities. Extremities: No edema. Neurological: Alert oriented x 4, no focal deficits.  Psychological:  Alert and cooperative. Normal mood and affect.  ASSESSMENT AND PLAN:    CC:  Garlan Fillers, MD

## 2023-02-23 ENCOUNTER — Ambulatory Visit
Admission: RE | Admit: 2023-02-23 | Discharge: 2023-02-23 | Disposition: A | Discharge status: Home / Self Care | Payer: Medicare Other | Source: Ambulatory Visit | Attending: Nurse Practitioner | Admitting: Nurse Practitioner

## 2023-02-23 DIAGNOSIS — M48061 Spinal stenosis, lumbar region without neurogenic claudication: Secondary | ICD-10-CM
